# Patient Record
Sex: Male | Born: 1950 | Race: White | Hispanic: No | Marital: Single | Smoking: Never smoker
Health system: Southern US, Community
[De-identification: ages and names within clinical notes are randomized; demographics above are authoritative.]

## PROBLEM LIST (undated history)

## (undated) DIAGNOSIS — F329 Major depressive disorder, single episode, unspecified: Secondary | ICD-10-CM

## (undated) DIAGNOSIS — T7840XA Allergy, unspecified, initial encounter: Secondary | ICD-10-CM

## (undated) DIAGNOSIS — F32A Depression, unspecified: Secondary | ICD-10-CM

## (undated) DIAGNOSIS — F411 Generalized anxiety disorder: Secondary | ICD-10-CM

## (undated) DIAGNOSIS — J189 Pneumonia, unspecified organism: Secondary | ICD-10-CM

## (undated) DIAGNOSIS — I872 Venous insufficiency (chronic) (peripheral): Secondary | ICD-10-CM

## (undated) DIAGNOSIS — R7303 Prediabetes: Secondary | ICD-10-CM

## (undated) DIAGNOSIS — M1990 Unspecified osteoarthritis, unspecified site: Secondary | ICD-10-CM

## (undated) DIAGNOSIS — E119 Type 2 diabetes mellitus without complications: Secondary | ICD-10-CM

## (undated) DIAGNOSIS — M545 Low back pain, unspecified: Secondary | ICD-10-CM

## (undated) DIAGNOSIS — G709 Myoneural disorder, unspecified: Secondary | ICD-10-CM

## (undated) DIAGNOSIS — I10 Essential (primary) hypertension: Secondary | ICD-10-CM

## (undated) DIAGNOSIS — J45909 Unspecified asthma, uncomplicated: Secondary | ICD-10-CM

## (undated) DIAGNOSIS — F419 Anxiety disorder, unspecified: Secondary | ICD-10-CM

## (undated) DIAGNOSIS — Z860100 Personal history of colon polyps, unspecified: Secondary | ICD-10-CM

## (undated) DIAGNOSIS — K219 Gastro-esophageal reflux disease without esophagitis: Secondary | ICD-10-CM

## (undated) DIAGNOSIS — K8020 Calculus of gallbladder without cholecystitis without obstruction: Secondary | ICD-10-CM

## (undated) DIAGNOSIS — E785 Hyperlipidemia, unspecified: Secondary | ICD-10-CM

## (undated) DIAGNOSIS — E669 Obesity, unspecified: Secondary | ICD-10-CM

## (undated) DIAGNOSIS — K5730 Diverticulosis of large intestine without perforation or abscess without bleeding: Secondary | ICD-10-CM

## (undated) HISTORY — DX: Allergy, unspecified, initial encounter: T78.40XA

## (undated) HISTORY — DX: Obesity, unspecified: E66.9

## (undated) HISTORY — DX: Low back pain: M54.5

## (undated) HISTORY — DX: Venous insufficiency (chronic) (peripheral): I87.2

## (undated) HISTORY — PX: COLONOSCOPY: SHX174

## (undated) HISTORY — DX: Personal history of colonic polyps: Z86.010

## (undated) HISTORY — DX: Essential (primary) hypertension: I10

## (undated) HISTORY — DX: Anxiety disorder, unspecified: F41.9

## (undated) HISTORY — PX: POLYPECTOMY: SHX149

## (undated) HISTORY — DX: Major depressive disorder, single episode, unspecified: F32.9

## (undated) HISTORY — PX: LUMBAR LAMINECTOMY: SHX95

## (undated) HISTORY — DX: Prediabetes: R73.03

## (undated) HISTORY — DX: Unspecified asthma, uncomplicated: J45.909

## (undated) HISTORY — DX: Generalized anxiety disorder: F41.1

## (undated) HISTORY — DX: Personal history of colon polyps, unspecified: Z86.0100

## (undated) HISTORY — DX: Unspecified osteoarthritis, unspecified site: M19.90

## (undated) HISTORY — DX: Diverticulosis of large intestine without perforation or abscess without bleeding: K57.30

## (undated) HISTORY — DX: Depression, unspecified: F32.A

## (undated) HISTORY — DX: Gastro-esophageal reflux disease without esophagitis: K21.9

## (undated) HISTORY — PX: LUMBAR FUSION: SHX111

## (undated) HISTORY — DX: Calculus of gallbladder without cholecystitis without obstruction: K80.20

## (undated) HISTORY — DX: Hyperlipidemia, unspecified: E78.5

## (undated) HISTORY — DX: Low back pain, unspecified: M54.50

---

## 2004-04-15 ENCOUNTER — Encounter: Admission: RE | Admit: 2004-04-15 | Discharge: 2004-04-15 | Payer: Self-pay | Admitting: Orthopaedic Surgery

## 2004-07-17 ENCOUNTER — Ambulatory Visit: Payer: Self-pay | Admitting: Pulmonary Disease

## 2004-08-12 ENCOUNTER — Ambulatory Visit: Payer: Self-pay | Admitting: Gastroenterology

## 2004-08-20 ENCOUNTER — Ambulatory Visit: Payer: Self-pay | Admitting: Gastroenterology

## 2004-09-07 ENCOUNTER — Emergency Department (HOSPITAL_COMMUNITY): Admission: EM | Admit: 2004-09-07 | Discharge: 2004-09-08 | Payer: Self-pay | Admitting: Emergency Medicine

## 2004-09-15 ENCOUNTER — Ambulatory Visit: Payer: Self-pay | Admitting: Pulmonary Disease

## 2004-11-17 ENCOUNTER — Ambulatory Visit: Payer: Self-pay | Admitting: Pulmonary Disease

## 2005-02-16 ENCOUNTER — Ambulatory Visit: Payer: Self-pay | Admitting: Pulmonary Disease

## 2005-05-12 ENCOUNTER — Ambulatory Visit: Payer: Self-pay | Admitting: Internal Medicine

## 2005-05-13 ENCOUNTER — Ambulatory Visit: Payer: Self-pay | Admitting: Pulmonary Disease

## 2005-05-14 ENCOUNTER — Ambulatory Visit: Payer: Self-pay | Admitting: Pulmonary Disease

## 2005-06-12 ENCOUNTER — Ambulatory Visit: Payer: Self-pay | Admitting: Internal Medicine

## 2005-08-12 ENCOUNTER — Ambulatory Visit: Payer: Self-pay | Admitting: Pulmonary Disease

## 2006-01-19 ENCOUNTER — Encounter: Admission: RE | Admit: 2006-01-19 | Discharge: 2006-01-19 | Payer: Self-pay | Admitting: Orthopaedic Surgery

## 2006-01-27 ENCOUNTER — Ambulatory Visit: Payer: Self-pay | Admitting: Pulmonary Disease

## 2006-01-28 ENCOUNTER — Ambulatory Visit: Payer: Self-pay | Admitting: Cardiology

## 2006-06-15 ENCOUNTER — Ambulatory Visit: Payer: Self-pay | Admitting: Pulmonary Disease

## 2006-07-01 ENCOUNTER — Ambulatory Visit: Payer: Self-pay | Admitting: Pulmonary Disease

## 2006-07-16 ENCOUNTER — Ambulatory Visit: Payer: Self-pay | Admitting: Pulmonary Disease

## 2007-01-04 ENCOUNTER — Ambulatory Visit: Payer: Self-pay | Admitting: Pulmonary Disease

## 2007-06-06 ENCOUNTER — Ambulatory Visit: Payer: Self-pay | Admitting: Pulmonary Disease

## 2007-06-06 LAB — CONVERTED CEMR LAB
ALT: 27 units/L (ref 0–53)
AST: 19 units/L (ref 0–37)
Albumin: 3.9 g/dL (ref 3.5–5.2)
BUN: 10 mg/dL (ref 6–23)
Basophils Absolute: 0 10*3/uL (ref 0.0–0.1)
Basophils Relative: 0.4 % (ref 0.0–1.0)
Bilirubin, Direct: 0.1 mg/dL (ref 0.0–0.3)
Calcium: 9.4 mg/dL (ref 8.4–10.5)
Chloride: 108 meq/L (ref 96–112)
Cholesterol: 153 mg/dL (ref 0–200)
Creatinine, Ser: 0.9 mg/dL (ref 0.4–1.5)
Direct LDL: 92.5 mg/dL
Eosinophils Relative: 1.5 % (ref 0.0–5.0)
Glucose, Bld: 107 mg/dL — ABNORMAL HIGH (ref 70–99)
HCT: 44.9 % (ref 39.0–52.0)
Lymphocytes Relative: 25.1 % (ref 12.0–46.0)
MCV: 88.9 fL (ref 78.0–100.0)
Monocytes Relative: 7.9 % (ref 3.0–11.0)
Neutro Abs: 4.6 10*3/uL (ref 1.4–7.7)
Neutrophils Relative %: 65.1 % (ref 43.0–77.0)
Platelets: 243 10*3/uL (ref 150–400)
RBC: 5.06 M/uL (ref 4.22–5.81)
RDW: 11.7 % (ref 11.5–14.6)
Sodium: 143 meq/L (ref 135–145)
Total Bilirubin: 0.8 mg/dL (ref 0.3–1.2)
Total CHOL/HDL Ratio: 5.4
Triglycerides: 230 mg/dL (ref 0–149)
VLDL: 46 mg/dL — ABNORMAL HIGH (ref 0–40)
WBC: 6.9 10*3/uL (ref 4.5–10.5)

## 2007-06-23 ENCOUNTER — Ambulatory Visit: Payer: Self-pay | Admitting: Pulmonary Disease

## 2007-06-23 DIAGNOSIS — J309 Allergic rhinitis, unspecified: Secondary | ICD-10-CM | POA: Insufficient documentation

## 2007-06-23 DIAGNOSIS — R7303 Prediabetes: Secondary | ICD-10-CM

## 2007-06-23 DIAGNOSIS — K649 Unspecified hemorrhoids: Secondary | ICD-10-CM | POA: Insufficient documentation

## 2007-06-23 DIAGNOSIS — E669 Obesity, unspecified: Secondary | ICD-10-CM | POA: Insufficient documentation

## 2007-06-23 DIAGNOSIS — F329 Major depressive disorder, single episode, unspecified: Secondary | ICD-10-CM | POA: Insufficient documentation

## 2007-06-23 DIAGNOSIS — M545 Low back pain: Secondary | ICD-10-CM | POA: Insufficient documentation

## 2007-06-23 DIAGNOSIS — K219 Gastro-esophageal reflux disease without esophagitis: Secondary | ICD-10-CM | POA: Insufficient documentation

## 2007-06-23 DIAGNOSIS — F411 Generalized anxiety disorder: Secondary | ICD-10-CM | POA: Insufficient documentation

## 2007-06-23 DIAGNOSIS — I10 Essential (primary) hypertension: Secondary | ICD-10-CM | POA: Insufficient documentation

## 2007-06-23 DIAGNOSIS — E119 Type 2 diabetes mellitus without complications: Secondary | ICD-10-CM | POA: Insufficient documentation

## 2007-09-05 ENCOUNTER — Telehealth: Payer: Self-pay | Admitting: Pulmonary Disease

## 2007-09-12 ENCOUNTER — Telehealth (INDEPENDENT_AMBULATORY_CARE_PROVIDER_SITE_OTHER): Payer: Self-pay | Admitting: *Deleted

## 2007-09-26 ENCOUNTER — Telehealth (INDEPENDENT_AMBULATORY_CARE_PROVIDER_SITE_OTHER): Payer: Self-pay | Admitting: *Deleted

## 2008-01-04 ENCOUNTER — Ambulatory Visit: Payer: Self-pay | Admitting: Pulmonary Disease

## 2008-01-04 DIAGNOSIS — D126 Benign neoplasm of colon, unspecified: Secondary | ICD-10-CM

## 2008-01-04 DIAGNOSIS — K5730 Diverticulosis of large intestine without perforation or abscess without bleeding: Secondary | ICD-10-CM | POA: Insufficient documentation

## 2008-01-04 DIAGNOSIS — J45909 Unspecified asthma, uncomplicated: Secondary | ICD-10-CM

## 2008-01-04 DIAGNOSIS — I872 Venous insufficiency (chronic) (peripheral): Secondary | ICD-10-CM | POA: Insufficient documentation

## 2008-01-04 DIAGNOSIS — E785 Hyperlipidemia, unspecified: Secondary | ICD-10-CM | POA: Insufficient documentation

## 2008-03-28 ENCOUNTER — Telehealth: Payer: Self-pay | Admitting: Gastroenterology

## 2008-04-01 ENCOUNTER — Emergency Department (HOSPITAL_COMMUNITY): Admission: EM | Admit: 2008-04-01 | Discharge: 2008-04-01 | Payer: Self-pay | Admitting: Emergency Medicine

## 2008-06-06 ENCOUNTER — Ambulatory Visit: Payer: Self-pay | Admitting: Pulmonary Disease

## 2008-06-19 ENCOUNTER — Ambulatory Visit: Payer: Self-pay | Admitting: Pulmonary Disease

## 2008-07-04 ENCOUNTER — Telehealth (INDEPENDENT_AMBULATORY_CARE_PROVIDER_SITE_OTHER): Payer: Self-pay | Admitting: *Deleted

## 2008-07-04 LAB — CONVERTED CEMR LAB
ALT: 38 units/L (ref 0–53)
AST: 19 units/L (ref 0–37)
Albumin: 4.2 g/dL (ref 3.5–5.2)
Alkaline Phosphatase: 71 units/L (ref 39–117)
BUN: 17 mg/dL (ref 6–23)
Basophils Relative: 0 % (ref 0.0–3.0)
Chloride: 102 meq/L (ref 96–112)
Creatinine, Ser: 1.2 mg/dL (ref 0.4–1.5)
Eosinophils Absolute: 0.2 10*3/uL (ref 0.0–0.7)
Eosinophils Relative: 2.5 % (ref 0.0–5.0)
Glucose, Bld: 81 mg/dL (ref 70–99)
HCT: 50.5 % (ref 39.0–52.0)
HDL: 30.7 mg/dL — ABNORMAL LOW (ref >39.0)
Hgb A1c MFr Bld: 5.1 % (ref 4.6–6.0)
MCV: 92.2 fL (ref 78.0–100.0)
Monocytes Relative: 8.4 % (ref 3.0–12.0)
Neutrophils Relative %: 58.2 % (ref 43.0–77.0)
Platelets: 229 10*3/uL (ref 150–400)
Potassium: 3.7 meq/L (ref 3.5–5.1)
RBC: 5.48 M/uL (ref 4.22–5.81)
Total CHOL/HDL Ratio: 4.3
Total Protein: 6.9 g/dL (ref 6.0–8.3)
Triglycerides: 208 mg/dL (ref 0–149)
WBC: 6.9 10*3/uL (ref 4.5–10.5)

## 2008-07-10 ENCOUNTER — Telehealth (INDEPENDENT_AMBULATORY_CARE_PROVIDER_SITE_OTHER): Payer: Self-pay | Admitting: *Deleted

## 2008-10-02 ENCOUNTER — Telehealth: Payer: Self-pay | Admitting: Pulmonary Disease

## 2008-10-12 ENCOUNTER — Telehealth (INDEPENDENT_AMBULATORY_CARE_PROVIDER_SITE_OTHER): Payer: Self-pay | Admitting: *Deleted

## 2008-10-17 ENCOUNTER — Telehealth: Payer: Self-pay | Admitting: Pulmonary Disease

## 2009-01-14 ENCOUNTER — Telehealth (INDEPENDENT_AMBULATORY_CARE_PROVIDER_SITE_OTHER): Payer: Self-pay | Admitting: *Deleted

## 2009-01-23 ENCOUNTER — Ambulatory Visit: Payer: Self-pay | Admitting: Pulmonary Disease

## 2009-01-24 ENCOUNTER — Encounter: Payer: Self-pay | Admitting: Pulmonary Disease

## 2009-04-18 ENCOUNTER — Telehealth: Payer: Self-pay | Admitting: Pulmonary Disease

## 2009-04-30 ENCOUNTER — Encounter: Payer: Self-pay | Admitting: Pulmonary Disease

## 2009-05-14 ENCOUNTER — Telehealth: Payer: Self-pay | Admitting: Pulmonary Disease

## 2009-05-29 ENCOUNTER — Ambulatory Visit: Payer: Self-pay | Admitting: Pulmonary Disease

## 2009-07-04 ENCOUNTER — Encounter (INDEPENDENT_AMBULATORY_CARE_PROVIDER_SITE_OTHER): Payer: Self-pay | Admitting: *Deleted

## 2009-07-15 ENCOUNTER — Ambulatory Visit: Payer: Self-pay | Admitting: Pulmonary Disease

## 2009-07-16 ENCOUNTER — Ambulatory Visit: Payer: Self-pay | Admitting: Pulmonary Disease

## 2009-07-17 ENCOUNTER — Telehealth: Payer: Self-pay | Admitting: Pulmonary Disease

## 2009-07-21 LAB — CONVERTED CEMR LAB
ALT: 33 units/L (ref 0–53)
AST: 21 units/L (ref 0–37)
Albumin: 4 g/dL (ref 3.5–5.2)
Alkaline Phosphatase: 73 units/L (ref 39–117)
Basophils Absolute: 0 10*3/uL (ref 0.0–0.1)
Calcium: 8.9 mg/dL (ref 8.4–10.5)
Eosinophils Relative: 2.1 % (ref 0.0–5.0)
GFR calc non Af Amer: 81.64 mL/min (ref >60)
Glucose, Bld: 124 mg/dL — ABNORMAL HIGH (ref 70–99)
HCT: 50.2 % (ref 39.0–52.0)
HDL: 29.3 mg/dL — ABNORMAL LOW (ref >39.00)
Hemoglobin: 17.5 g/dL — ABNORMAL HIGH (ref 13.0–17.0)
LDL Cholesterol: 65 mg/dL (ref 0–99)
Lymphocytes Relative: 23.3 % (ref 12.0–46.0)
Monocytes Relative: 7.2 % (ref 3.0–12.0)
Neutro Abs: 4.5 10*3/uL (ref 1.4–7.7)
Potassium: 4 meq/L (ref 3.5–5.1)
RBC: 5.32 M/uL (ref 4.22–5.81)
RDW: 11.4 % — ABNORMAL LOW (ref 11.5–14.6)
Sodium: 143 meq/L (ref 135–145)
Total Bilirubin: 0.7 mg/dL (ref 0.3–1.2)
Total CHOL/HDL Ratio: 4
VLDL: 36.8 mg/dL (ref 0.0–40.0)
WBC: 6.7 10*3/uL (ref 4.5–10.5)

## 2009-07-29 ENCOUNTER — Telehealth: Payer: Self-pay | Admitting: Pulmonary Disease

## 2009-07-30 ENCOUNTER — Encounter: Payer: Self-pay | Admitting: Pulmonary Disease

## 2009-10-16 ENCOUNTER — Ambulatory Visit: Payer: Self-pay | Admitting: Pulmonary Disease

## 2009-11-06 ENCOUNTER — Telehealth (INDEPENDENT_AMBULATORY_CARE_PROVIDER_SITE_OTHER): Payer: Self-pay | Admitting: *Deleted

## 2009-11-13 ENCOUNTER — Ambulatory Visit: Payer: Self-pay | Admitting: Pulmonary Disease

## 2009-11-13 LAB — CONVERTED CEMR LAB
BUN: 14 mg/dL (ref 6–23)
Chloride: 108 meq/L (ref 96–112)
Creatinine, Ser: 1 mg/dL (ref 0.4–1.5)
Glucose, Bld: 107 mg/dL — ABNORMAL HIGH (ref 70–99)
Potassium: 4.4 meq/L (ref 3.5–5.1)

## 2010-01-13 ENCOUNTER — Ambulatory Visit: Payer: Self-pay | Admitting: Pulmonary Disease

## 2010-01-13 ENCOUNTER — Encounter: Payer: Self-pay | Admitting: Pulmonary Disease

## 2010-01-14 ENCOUNTER — Ambulatory Visit: Payer: Self-pay | Admitting: Pulmonary Disease

## 2010-01-14 DIAGNOSIS — D649 Anemia, unspecified: Secondary | ICD-10-CM

## 2010-01-16 LAB — CONVERTED CEMR LAB
AST: 20 units/L (ref 0–37)
Alkaline Phosphatase: 65 units/L (ref 39–117)
Basophils Absolute: 0 10*3/uL (ref 0.0–0.1)
Bilirubin, Direct: 0.1 mg/dL (ref 0.0–0.3)
Eosinophils Relative: 2.8 % (ref 0.0–5.0)
GFR calc non Af Amer: 86.47 mL/min (ref >60)
HCT: 47.9 % (ref 39.0–52.0)
LDL Cholesterol: 47 mg/dL (ref 0–99)
Lymphs Abs: 1.7 10*3/uL (ref 0.7–4.0)
MCHC: 35.3 g/dL (ref 30.0–36.0)
MCV: 90.6 fL (ref 78.0–100.0)
Monocytes Absolute: 0.5 10*3/uL (ref 0.1–1.0)
Platelets: 239 10*3/uL (ref 150.0–400.0)
Potassium: 4.4 meq/L (ref 3.5–5.1)
RDW: 12.7 % (ref 11.5–14.6)
Sodium: 140 meq/L (ref 135–145)
TSH: 0.8 microintl units/mL (ref 0.35–5.50)
Total Bilirubin: 0.5 mg/dL (ref 0.3–1.2)
VLDL: 34.8 mg/dL (ref 0.0–40.0)

## 2010-01-23 ENCOUNTER — Encounter (INDEPENDENT_AMBULATORY_CARE_PROVIDER_SITE_OTHER): Payer: Self-pay | Admitting: *Deleted

## 2010-03-07 ENCOUNTER — Encounter (INDEPENDENT_AMBULATORY_CARE_PROVIDER_SITE_OTHER): Payer: Self-pay | Admitting: *Deleted

## 2010-03-11 ENCOUNTER — Ambulatory Visit: Payer: Self-pay | Admitting: Gastroenterology

## 2010-03-25 ENCOUNTER — Ambulatory Visit: Payer: Self-pay | Admitting: Gastroenterology

## 2010-03-27 ENCOUNTER — Encounter: Payer: Self-pay | Admitting: Gastroenterology

## 2010-06-02 ENCOUNTER — Ambulatory Visit: Payer: Self-pay | Admitting: Pulmonary Disease

## 2010-06-10 ENCOUNTER — Telehealth (INDEPENDENT_AMBULATORY_CARE_PROVIDER_SITE_OTHER): Payer: Self-pay | Admitting: *Deleted

## 2010-07-15 ENCOUNTER — Ambulatory Visit: Payer: Self-pay | Admitting: Pulmonary Disease

## 2010-09-22 ENCOUNTER — Ambulatory Visit
Admission: RE | Admit: 2010-09-22 | Discharge: 2010-09-22 | Payer: Self-pay | Source: Home / Self Care | Attending: Pulmonary Disease | Admitting: Pulmonary Disease

## 2010-09-22 DIAGNOSIS — J069 Acute upper respiratory infection, unspecified: Secondary | ICD-10-CM | POA: Insufficient documentation

## 2010-10-02 NOTE — Assessment & Plan Note (Signed)
Summary: flu shot/jd   Nurse Visit   Allergies: No Known Drug Allergies  Immunizations Administered:  Influenza Vaccine # 1:    Vaccine Type: Fluvax 3+    Site: left deltoid    Mfr: GlaxoSmithKline    Dose: 0.5 ml    Route: IM    Given by: Kandice Hams CMA    Exp. Date: 02/28/2011    Lot #: [id number]    VIS given: 03/25/10 version given June 02, 2010.  Flu Vaccine Consent Questions:    Do you have a history of severe allergic reactions to this vaccine? no    Any prior history of allergic reactions to egg and/or gelatin? no    Do you have a sensitivity to the preservative Thimersol? no    Do you have a past history of Guillan-Barre Syndrome? no    Do you currently have an acute febrile illness? no    Have you ever had a severe reaction to latex? no    Vaccine information given and explained to patient? yes  Orders Added: 1)  Flu Vaccine 16yrs + [90658] 2)  Admin 1st Vaccine [29562]

## 2010-10-02 NOTE — Miscellaneous (Signed)
Summary: tramadol and flexeril refills  Medications Added FLEXERIL 10 MG  TABS (CYCLOBENZAPRINE HCL) take one tablet at bedtime as needed... TRAMADOL HCL 50 MG TABS (TRAMADOL HCL) 1 tabs by mouth three times a day as needed pain TRAMADOL HCL 50 MG TABS (TRAMADOL HCL) 1-2 tabs by mouth every 4-6 hours as needed pain       Clinical Lists Changes  Medications: Added new medication of TRAMADOL HCL 50 MG TABS (TRAMADOL HCL) 1-2 tabs by mouth every 4-6 hours as needed pain - Signed Changed medication from TRAMADOL HCL 50 MG TABS (TRAMADOL HCL) 1-2 tabs by mouth every 4-6 hours as needed pain to TRAMADOL HCL 50 MG TABS (TRAMADOL HCL) 1 tabs by mouth three times a day as needed pain - Signed Changed medication from FLEXERIL 10 MG  TABS (CYCLOBENZAPRINE HCL) take one tablet at bedtime as needed... to FLEXERIL 10 MG  TABS (CYCLOBENZAPRINE HCL) take one tablet at bedtime as needed... - Signed Rx of TRAMADOL HCL 50 MG TABS (TRAMADOL HCL) 1 tabs by mouth three times a day as needed pain;  #270 x 0;  Signed;  Entered by: Boone Master CNA;  Authorized by: Michele Mcalpine MD;  Method used: Electronically to [id number]*, [address] [place], [place], [place]  [address], Ph: [phone], Fax: [phone] Rx of FLEXERIL 10 MG  TABS (CYCLOBENZAPRINE HCL) take one tablet at bedtime as needed...;  #90 x 0;  Signed;  Entered by: Boone Master CNA;  Authorized by: Michele Mcalpine MD;  Method used: Electronically to [facility] [address]*, [address] [place], [place], [place]  [address], Ph: [phone], Fax: [phone]    Prescriptions: FLEXERIL 10 MG  TABS (CYCLOBENZAPRINE HCL) take one tablet at bedtime as needed...  #90 x 0   Entered by:   Boone Master CNA   Authorized by:   Michele Mcalpine MD   Signed by:   Boone Master CNA on 10/16/2009   Method used:   Electronically to        [facility] [phone]* (retail)       [address] [place], [place]  [address]       Ph: [phone]       Fax: [phone]   RxID:   [id number] TRAMADOL HCL 50 MG TABS (TRAMADOL HCL) 1 tabs by mouth three times a day as needed pain  #270 x 0   Entered by:   Boone Master CNA   Authorized by:   Michele Mcalpine MD   Signed by:   Boone Master CNA on 10/16/2009   Method used:   Electronically to        [facility] [phone]* (retail)       [address] [place], [place]  [address]       Ph: [phone]       Fax: [phone]   RxID:   [id number]

## 2010-10-02 NOTE — Assessment & Plan Note (Signed)
Summary: NP follow up - HTN   CC:  follow up b/p .  History of Present Illness: 60  y/o WM  with known hx of HTN, chronic pain syndrome, DJD   follow up visit... he has mult med issues as noted below... he has a hx of a chronic pain syndrome and mult somatic complaints... he is on disability due to LBP & chr pain syndrome- followed by DrYates & Otelia Sergeant, DrBeane & [place], DrTruslow... we filled out his disability form in May09 & in May10...   ~  June 19, 2008:  he tells me that he was seen in the ER 2-3 months ago for abd pain- he had a CT Abd which showed fat in liver/ pancreas, divertics, no renal stones etc...  and lab work showing no blood in stool, Hg= 18, Chem= norm, UA= clear... he worries about his BP and complains about his back... he has already had his seasonal flu shot.   ~  Jan 23, 2009:  he is here for his disability papers and he talks nonstop about his disability and former problems w/ work... as best I can tell, when I can get him on point, he has no specific complaints or concerns (except the cost of his generic DCN100)...   ~  July 15, 2009:  mult somatic complaints but it appears as though he's doing reasonably well overall... he notes "superficial veins popping out on my chest when I lookin the mirror" & exam of his chest wall is normal... he has continued LBP issues treated by [facility]... he is concerned about formulary changes for 2011 & he will research this & let us know what he wants to do...  October 16, 2009 --Presents for work in visit. Complains of elevated BP (up to 170/110) with HA, both ears clogged, "throbbing" neck, stiffness in back of neck, chest discomfort x5days. Labs reviewed from last visit, w/ nml TSH.  Denies decongestants. Drinks daily caffeine- soda. Denies, palpitations, dyspnea, orthopnea, hemoptysis, fever, n/v/d, edema, headache,recent travel or antibiotics, syncope, exertional chest pain. Feels joint aches all over. B/P up and  down 140-170.    November 13, 2009--Presents for B/P follow up. 4 week follow up for HTN    Last visit b/p up  ~170/110. Started on Diovan 160mg . He is tolerating well. B/P avg 110-130. States the diovan is too expensive and would like something less expesive for which he has brought a list of alternatives. Cozaar is on list. Denies chest pain, dyspnea, orthopnea, hemoptysis, fever, n/v/d, edema, headache.   Medications Prior to Update: 1)  Amlodipine Besylate 5 Mg Tabs (Amlodipine Besylate) .Marland Kitchen.. 1 By Mouth Daily 2)  Omeprazole 20 Mg  Cpdr (Omeprazole) .... Take One Tab By Mouth Once Daily.Marland KitchenMarland Kitchen 3)  Flexeril 10 Mg  Tabs (Cyclobenzaprine Hcl) .... Take One Tablet At Bedtime As Needed... 4)  Alprazolam 0.5 Mg  Tabs (Alprazolam) .... Take One Tablet Three Times A Day As Needed For Nerves.Marland KitchenMarland Kitchen 5)  Aspirin 81 Mg Tbec (Aspirin) .... Take 1 Tablet By Mouth Once A Day 6)  Tramadol Hcl 50 Mg Tabs (Tramadol Hcl) .Marland Kitchen.. 1 Tabs By Mouth Three Times A Day As Needed Pain  Current Medications (verified): 1)  Amlodipine Besylate 5 Mg Tabs (Amlodipine Besylate) .Marland Kitchen.. 1 By Mouth Daily 2)  Omeprazole 20 Mg  Cpdr (Omeprazole) .... Take One Tab By Mouth Once Daily.Marland KitchenMarland Kitchen 3)  Flexeril 10 Mg  Tabs (Cyclobenzaprine Hcl) .... Take One Tablet At Bedtime As Needed... 4)  Alprazolam  0.5 Mg  Tabs (Alprazolam) .... Take One Tablet Three Times A Day As Needed For Nerves.Marland KitchenMarland Kitchen 5)  Aspirin 81 Mg Tbec (Aspirin) .... Take 1 Tablet By Mouth Once A Day 6)  Tramadol Hcl 50 Mg Tabs (Tramadol Hcl) .Marland Kitchen.. 1 Tabs By Mouth Three Times A Day As Needed Pain  Allergies (verified): No Known Drug Allergies  Past History:  Past Medical History: Last updated: 10/16/2009 ALLERGIC RHINITIS (ICD-477.9) - hx mild AR and sinusitis in past... prev eval from DrESL.  ASTHMA, MILD (ICD-493.90) - hx mild AB in the past... prev on Advair & Singulair... no recent URI's etc.  HYPERTENSION (ICD-401.9) - controlled on diet, low sodium, and AMLODIPINE 5mg /d.   VENOUS  INSUFFICIENCY (ICD-459.81) - only tr edema as noted... REC ~ low sodium/ elevate/ TED's...  HYPERLIPIDEMIA (ICD-272.4) - on diet alone...  ~  FLP 10/08 showed TChol 153, TG 230, HDL 29, LDL 93... needs better diet, consider fibrate Rx.  ~  FLP 10/09 (wt= 250#) showed TChol 133, TG 208, HDL 31, LDL 65... may need fibrate Rx.  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   ~  prev labs w/ BS as high as 136... no problem since then...  ~  labs in 2007 showed BS ~ 100, HgA1c=5.1.Marland KitchenMarland Kitchen diet Rx alone, pt encouraged to lose weight...  ~  labs 10/08 showed BS=107  ~  labs 10/09 showed BS= 81, A1c= 5.1  ~  labs 11/10 showed BS= 124  OBESITY (ICD-278.00) - not really on diet or exercise program... states he's walking some and mowing the yard slowly...   ~  weight 5/09 = 269#  ~  weight 10/09 = 250#  ~  weight 5/10 = 262#  ~  weight 11/10 = 273#... we discussed diet + exercise!  GERD (ICD-530.81) - on OMEPRAZOLE 20mg /d... it controls his symptoms well...  DIVERTICULOSIS OF COLON (ICD-562.10), COLONIC POLYPS (ICD-211.3), HEMORRHOIDS (ICD-455.6) -   ~  last colonoscopy 12/05 by DrStark showed divertics & several polyps= ?path... f/u planned 72yrs.  BACK PAIN, LUMBAR (ICD-724.2) - he takes FLEXERIL 10mg Qhs & DCN100 Tid for pain... he notes that he has new insurance and [facility] & Dor[facility] Bull[facility] are not on the list... he is going to see DrTooke for his opinion...  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 DEPRESSION (I  Past Surgical History: Last updated: 07/15/2009 S/P lumbar laminectomy in 1989 & 1994  Family History: Last updated: Feb 22, 2009 Father died age 57 from DM, ASHD, stroke...  he also had a hx of colon cancer & prostate cancer. Mother died age 83 from metastatic lung cancer No siblings  Social History: Last updated: 02-22-09 Single, never married No  children Never smoked  No alcohol prev employed doing Naval architect work for [employer] he's been on disability since 3/05 due to his back...  Risk Factors: Smoking Status: never (2009/02/22)  Review of Systems      See HPI  Vital Signs:  Patient profile:   60 year old male Height:      75 inches Weight:      260.50 pounds BMI:     32.68 O2 Sat:      98 % on Room air Temp:     98.2 degrees F oral Pulse rate:   73 / minute BP sitting:   116 / 74  (right arm) Cuff size:   regular  Vitals Entered By: Boone Master CNA (November 13, 2009 9:59 AM)  O2 Flow:  Room air CC: follow up b/p  Is Patient Diabetic? No Comments Medications reviewed with patient Daytime contact number verified with patient. Boone Master CNA  November 13, 2009 10:00 AM    Physical Exam  Additional Exam:  WD, Overweight, 60 y/o WM in NAD... GENERAL:  Alert & oriented HEENT:  [place]/AT, EOM-wnl, PERRLA, EACs-clear, TMs-wnl, NOSE-clear, THROAT-clear & wnl. NECK:  Supple w/ fairROM; no JVD; normal carotid impulses w/o bruits; no thyromegaly or nodules palpated; no lymphadenopathy. CHEST:  Clear to P & A; without wheezes/ rales/ or rhonchi; he has mult trigger points... HEART:  Regular Rhythm;  gr1/6 SEM, no rubs or gallops heard... ABDOMEN:  Soft & nontender; normal bowel sounds; no organomegaly or masses detected. EXT: without deformities, mild arthritic changes; no varicose veins/ +venous insuffic/ tr edema. NEURO:  CN's intact;  no focal neuro deficits... DERM:  No lesions noted; no rash etc...      Impression & Recommendations:  Problem # 1:  HYPERTENSION (ICD-401.9)  Much improved control. Will change Diovan to Cozaar 50mg  d/t cost check bmet today.  REC:  Will change Diovan to Losartan 50mg  once daily  Bring blood pressure log to each next office visit.  Low salt diet.  follow up Dr. Kriste Basque in 2 months  Please contact office for sooner follow up if symptoms do not improve or worsen  His  updated medication list for this problem includes:    Amlodipine Besylate 5 Mg Tabs (Amlodipine besylate) .Marland Kitchen... 1 by mouth daily    Losartan Potassium 50 Mg Tabs (Losartan potassium) .Marland Kitchen... 1 by mouth once daily  Orders: TLB-BMP (Basic Metabolic Panel-BMET) (80048-METABOL) Est. Patient Level III ([address])  Medications Added to Medication List This Visit: 1)  Losartan Potassium 50 Mg Tabs (Losartan potassium) .Marland Kitchen.. 1 by mouth once daily  Complete Medication List: 1)  Amlodipine Besylate 5 Mg Tabs (Amlodipine besylate) .Marland Kitchen.. 1 by mouth daily 2)  Losartan Potassium 50 Mg Tabs (Losartan potassium) .Marland Kitchen.. 1 by mouth once daily 3)  Omeprazole 20 Mg Cpdr (Omeprazole) .... Take one tab by mouth once daily.Marland KitchenMarland Kitchen 4)  Flexeril 10 Mg Tabs (Cyclobenzaprine hcl) .... Take one tablet at bedtime as needed... 5)  Alprazolam 0.5 Mg Tabs (Alprazolam) .... Take one tablet three times a day as needed for nerves... 6)  Aspirin 81 Mg Tbec (Aspirin) .... Take 1 tablet by mouth once a day 7)  Tramadol Hcl 50 Mg Tabs (Tramadol hcl) .Marland Kitchen.. 1 tabs by mouth three times a day as needed pain  Patient Instructions: 1)  Will change Diovan to Losartan 50mg  once daily  2)  Bring blood pressure log to each next office visit.  3)  Low salt diet.  4)  follow up Dr. Kriste Basque in 2 months  5)  Please contact office for sooner follow up if symptoms do not improve or worsen  Prescriptions: LOSARTAN POTASSIUM 50 MG TABS (LOSARTAN POTASSIUM) 1 by mouth once daily  #90 x 3   Entered and Authorized by:   Rubye Oaks NP   Signed by:   Zebedee Segundo NP on 11/13/2009   Method used:   Faxed to ...       [facility] (mail-order)             , [place]         Ph: [phone]       Fax: [phone]   RxID:   [id number]

## 2010-10-02 NOTE — Progress Notes (Signed)
Summary: prescription for Xanax  Phone Note Call from Patient Call back at Home Phone [phone]   Caller: Patient Call For: nadel Summary of Call: Pt states his rx for alprazolam 0.5mg  has expired, needs a new one sent.//[facility] Initial call taken by: Darletta Moll,  June 10, 2010 10:32 AM  Follow-up for Phone Call        rx sent to pharmacy.  pt aware. Arman Filter LPN  June 10, 2010 11:34 AM     Prescriptions: ALPRAZOLAM 0.5 MG  TABS (ALPRAZOLAM) take one tablet three times a day as needed for nerves...  #100 x 3   Entered by:   Arman Filter LPN   Authorized by:   Michele Mcalpine MD   Signed by:   Arman Filter LPN on 09/81/1914   Method used:   Telephoned to ...       [facility] [phone]* (retail)       [address] [place], [place]  [address]       Ph: [phone]       Fax: [phone]   RxID:   [id number]

## 2010-10-02 NOTE — Procedures (Signed)
Summary: Colonoscopy  Patient: [name] [name] Note: All result statuses are Final unless otherwise noted.  Tests: (1) Colonoscopy (COL)   COL Colonoscopy           [facility]      [facility]     [address]     [place], [place]  [address]           COLONOSCOPY PROCEDURE REPORT           PATIENT:  [name] [name], [name] [name]  MR#:  [id number]     BIRTHDATE:  [birth year], 59 yrs. old  GENDER:  male     ENDOSCOPIST:  Judie Petit T. Russella Dar, MD, [facility]     PROCEDURE DATE:  03/25/2010     PROCEDURE:  Colonoscopy with biopsy     ASA CLASS:  Class II     INDICATIONS:  1) follow-up of polyp  2) family history of colon     cancer  3) surveillance and high-risk screening, adenomatous     polyp, 07/2004. F and MGM with colon cancer.     MEDICATIONS:   Fentanyl 100 mcg IV, Versed 10 mg IV     DESCRIPTION OF PROCEDURE:   After the risks benefits and     alternatives of the procedure were thoroughly explained, informed     consent was obtained.  Digital rectal exam was performed and     revealed no abnormalities.   The LB [id number] endoscope     was introduced through the anus and advanced to the cecum, which     was identified by both the appendix and ileocecal valve, without     limitations.  The quality of the prep was excellent, using     MoviPrep.  The instrument was then slowly withdrawn as the colon     was fully examined.     <<PROCEDUREIMAGES>>     FINDINGS:  Scattered diverticula were found in the transverse     colon.  Moderate diverticulosis was found in the sigmoid colon.  A     sessile polyp was found in the distal transverse colon. It was 4     mm in size. The polyp was removed using cold biopsy forceps.  This     was otherwise a normal examination of the colon.   Retroflexed     views in the rectum revealed internal hemorrhoids, small.  The     time to cecum =  2.5  minutes. The scope was then withdrawn (time     =  10  min) from the patient and the procedure completed.        COMPLICATIONS:  None           ENDOSCOPIC IMPRESSION:     1) Diverticula, scattered in the transverse colon     2) Moderate diverticulosis in the sigmoid colon     3) 4 mm sessile polyp in the distal transverse colon     4) Internal hemorrhoids           RECOMMENDATIONS:     1) High fiber diet with liberal fluid intake.     2) Await pathology results     3) Repeat Colonoscopy in 5 years pending pathology review           Marquinn Meschke T. Russella Dar, MD, [facility]           CC: Michele Mcalpine, MD           n.     eSIGNED:  Venita Lick. Aniesa Boback at 03/25/2010 11:00 AM           Dallie Piles, [id number]  Note: An exclamation mark (!) indicates a result that was not dispersed into the flowsheet. Document Creation Date: 03/25/2010 11:01 AM _______________________________________________________________________  (1) Order result status: Final Collection or observation date-time: 03/25/2010 10:56 Requested date-time:  Receipt date-time:  Reported date-time:  Referring Physician:   Ordering Physician: Claudette Head [phone]) Specimen Source:  Source: Launa Grill Order Number: [id number] Lab site:   Appended Document: Colonoscopy ```````````````````````````````````````````````````````````````````````````    Procedures Next Due Date:    Colonoscopy: 03/2015

## 2010-10-02 NOTE — Assessment & Plan Note (Signed)
Summary: 6 months/apc   CC:  6 month ROV & review of mult medical problems....  History of Present Illness: 60 y/o WM here for a follow up visit... he has mult med issues as noted below... he has a hx of a chronic pain syndrome and mult somatic complaints... he is on disability due to LBP & chr pain syndrome- followed by DrYates & Otelia Sergeant, DrBeane & Aplington, DrTruslow... we filled out his disability form in May09 & in May10...   ~  Oct09:  he tells me that he was seen in the ER 2-3 months ago for abd pain- he had a CT Abd which showed fat in liver/ pancreas, divertics, no renal stones etc...  and lab work showing no blood in stool, Hg= 18, Chem= norm, UA= clear... he worries about his BP and complains about his back... he has already had his seasonal flu shot.   ~  May10:  he is here for his disability papers and he talks nonstop about his disability and former problems w/ work... as best I can tell, when I can get him on point, he has no specific complaints or concerns (except the cost of his generic DCN100)...  ~  Nov10:  mult somatic complaints but it appears as though he's doing reasonably well overall... he notes "superficial veins popping out on my chest when I look in the mirror" & exam of his chest wall is normal... he has continued LBP issues treated by Rheum, Ortho & Neurosurg... he is concerned about formulary changes for 2011 & he will research this & let us know what he wants to do...   ~  Jan 13, 2010:  he continues to have mult minor somatic complaints, but overall appears stable on current regimen... Losartan was added to his antihypertensive regimen w/ improvement in his home BP checks... he has also lost 20# on diet!!!  He is due for CXR, fasting labs, and f/u colonoscopy (refer to DrStark)... he is quite concerned that his cousin will not drive him here for the colonoscopy & asks that I write a letter indicatng that this is important for him...    Current Problem List:  ALLERGIC  RHINITIS (ICD-477.9) - hx mild AR and sinusitis in past... prev eval from DrESL.  ASTHMA, MILD (ICD-493.90) - hx mild AB in the past... prev on Advair & Singulair... no recent URI's etc.  HYPERTENSION (ICD-401.9) - controlled on diet, low sodium, ASA 81mg , AMLODIPINE 5mg /d, & LOSARTAN 50mg /d... BP= 120/82 today & similar at home now... taking meds regularly & tol well- his ROS is generally pos but no signif CP, palpit, dyspnea, edema.  VENOUS INSUFFICIENCY (ICD-459.81) - only tr edema as noted... REC ~ low sodium/ elevate/ TED's...  HYPERLIPIDEMIA (ICD-272.4) - on diet alone...  ~  FLP 10/08 showed TChol 153, TG 230, HDL 29, LDL 93... needs better diet, consider fibrate Rx.  ~  FLP 10/09 (wt= 250#) showed TChol 133, TG 208, HDL 31, LDL 65... may need fibrate Rx.  ~  FLP 11/10 showed TChol 131, TG 184, HDL 29, LDL 65... rec> beeter low fat diet, get wt down!  ~  FLP 5/11 showed   DIABETES MELLITUS, BORDERLINE (ICD-790.29) - on diet alone w/ weight reduction as noted...  ~  prev labs w/ BS as high as 136... no problem since then...  ~  labs in 2007 showed BS ~ 100, HgA1c=5.1.Marland KitchenMarland Kitchen diet Rx alone, pt encouraged to lose weight...  ~  labs 10/08 showed BS=107  ~  labs 10/09 showed BS= 81, A1c= 5.1  ~  labs 11/10 showed BS= 124  ~  labs 5/11 showed   OBESITY (ICD-278.00) - not really on diet or exercise program... states he's walking some and mowing the yard slowly...   ~  weight 5/09 = 269#  ~  weight 10/09 = 250#  ~  weight 5/10 = 262#  ~  weight 11/10 = 273#... we discussed diet + exercise!  ~  weight 5/11 = 250#... on diet & walking...  GERD (ICD-530.81) - on OMEPRAZOLE 20mg /d... it controls his symptoms well...  DIVERTICULOSIS OF COLON (ICD-562.10), COLONIC POLYPS (ICD-211.3), HEMORRHOIDS (ICD-455.6) - f/u colon due & we will refer to DrStark.  ~  last colonoscopy 12/05 by DrStark showed divertics & several polyps= ?path... f/u planned 83yrs.  BACK PAIN, LUMBAR (ICD-724.2) - he takes  FLEXERIL 10mg Qhs & TRAMADOL 50mg  Tid for pain... he notes that he has new insurance and [facility] & [facility] are not on the list... he is going to see DrTooke for his opinion...  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 DEPRESSION (ICD-311) - I offered psychiatric referral but he declines...  Health Maintenance:  he had the 2010 flu shot...   Allergies (verified): No Known Drug Allergies  Comments:  Nurse/Medical Assistant: The patient's medications and allergies were reviewed with the patient and were updated in the Medication and Allergy Lists.  Past History:  Past Medical History: ALLERGIC RHINITIS (ICD-477.9) ASTHMA, MILD (ICD-493.90) HYPERTENSION (ICD-401.9) VENOUS INSUFFICIENCY (ICD-459.81) HYPERLIPIDEMIA (ICD-272.4) DIABETES MELLITUS, BORDERLINE (ICD-790.29) OBESITY (ICD-278.00) GERD (ICD-530.81) DIVERTICULOSIS OF COLON (ICD-562.10) COLONIC POLYPS (ICD-211.3) HEMORRHOIDS (ICD-455.6) BACK PAIN, LUMBAR (ICD-724.2) ANXIETY DISORDER, GENERALIZED (ICD-300.02) DEPRESSION (ICD-311)  Past Surgical History: S/P lumbar laminectomy in 1989 & 1994  Family History: Reviewed history from 01/23/2009 and no changes required. Father died age 37 from DM, ASHD, stroke...  he also had a hx of colon cancer & prostate cancer. Mother died age 24 from metastatic lung cancer No siblings  Social History: Reviewed history from 01/23/2009 and no changes required. Single, never married No children Never smoked  No alcohol prev employed doing warehouse work for [employer] he's been on disability since 3/05 due to his back...  Review of Systems      See HPI       The patient complains of dyspnea on exertion, peripheral edema, headaches, abdominal pain, muscle weakness, difficulty walking, and depression.  The patient denies anorexia, fever, weight loss, weight gain, vision loss, decreased hearing, hoarseness, chest pain, syncope,  prolonged cough, hemoptysis, melena, hematochezia, severe indigestion/heartburn, hematuria, incontinence, suspicious skin lesions, transient blindness, unusual weight change, abnormal bleeding, enlarged lymph nodes, and angioedema.    Vital Signs:  Patient profile:   60 year old male Height:      75 inches Weight:      250 pounds BMI:     31.36 O2 Sat:      96 % on Room air Temp:     98.0 degrees F oral Pulse rate:   65 / minute BP sitting:   120 / 82  (right arm) Cuff size:   regular  Vitals Entered By: Randell Loop CMA (Jan 13, 2010 11:00 AM)  O2 Sat at Rest %:  96 O2 Flow:  Room air CC: 6 month ROV & review of mult medical problems... Is Patient Diabetic? Yes Pain Assessment Patient in pain? no      Comments NO CHANGES IN MEDS TODAY   Physical Exam  Additional Exam:  WD, Overweight, 60 y/o WM in NAD... GENERAL:  Alert & oriented; he has a depressed mood & sl flat affect... HEENT:  [place]/AT, EOM-wnl, PERRLA, EACs-clear, TMs-wnl, NOSE-clear, THROAT-clear & wnl. NECK:  Supple w/ fairROM; no JVD; normal carotid impulses w/o bruits; no thyromegaly or nodules palpated; no lymphadenopathy. CHEST:  Clear to P & A; without wheezes/ rales/ or rhonchi; he has mult trigger points... HEART:  Regular Rhythm;  gr1/6 SEM, no rubs or gallops heard... ABDOMEN:  Soft & nontender; normal bowel sounds; no organomegaly or masses detected. EXT: without deformities, mild arthritic changes; no varicose veins/ +venous insuffic/ tr edema. NEURO:  CN's intact;  no focal neuro deficits... DERM:  No lesions noted; no rash etc...    CXR  Procedure date:  01/13/2010  Findings:      CHEST - 2 VIEW Comparison: Chest x-ray of 09/07/2004   Findings: The lungs are clear and slightly hyperaerated. Mediastinal contours are stable.  The heart is within normal limits in size.  There are degenerative changes in the lower thoracic spine.   IMPRESSION: Stable chest x-ray with slight hyperaeration.  No  active lung disease.   Read By:  Juline Patch,  M.D.    MISC. Report  Procedure date:  01/13/2010  Findings:      Pt will ret to our lab for FASTING blood work & we will notify him of these results and any recommended change in medications...  SN   Impression & Recommendations:  Problem # 1:  ASTHMA, MILD (ICD-493.90) He has hx mild AR & asthma>  stable, no regular medications required...  Problem # 2:  HYPERTENSION (ICD-401.9) Controlled on meds>  continue same & home monitoring. His updated medication list for this problem includes:    Amlodipine Besylate 5 Mg Tabs (Amlodipine besylate) .Marland Kitchen... 1 by mouth daily    Losartan Potassium 50 Mg Tabs (Losartan potassium) .Marland Kitchen... 1 by mouth once daily  Orders: T-2 View CXR (71020TC)  Problem # 3:  HYPERLIPIDEMIA (ICD-272.4) He will ret for FLP (he has lost 22# & hopefully improved)...   Problem # 4:  DIABETES MELLITUS, BORDERLINE (ICD-790.29) Stable on diet alone, and nice job w/ weight reduction...  Problem # 5:  COLONIC POLYPS (ICD-211.3) Due for f/u colonoscopy by DrStark... letter written at pt request to stress to his cousin how important it is to take eugene to & from this procedure... Orders: Gastroenterology Referral (GI)  Problem # 6:  BACK PAIN, LUMBAR (ICD-724.2) Stable on meds listed... His updated medication list for this problem includes:    Aspirin 81 Mg Tbec (Aspirin) .Marland Kitchen... Take 1 tablet by mouth once a day    Tramadol Hcl 50 Mg Tabs (Tramadol hcl) .Marland Kitchen... 1 tabs by mouth three times a day as needed pain    Flexeril 10 Mg Tabs (Cyclobenzaprine hcl) .Marland Kitchen... Take one tablet at bedtime as needed...  Problem # 7:  ANXIETY DISORDER, GENERALIZED (ICD-300.02) Stable w/ Prn Alpraz... His updated medication list for this problem includes:    Alprazolam 0.5 Mg Tabs (Alprazolam) .Marland Kitchen... Take one tablet three times a day as needed for nerves...  Problem # 8:  OTHER MEDICAL PROBLEMS AS NOTED>>>  Complete Medication List: 1)   Aspirin 81 Mg Tbec (Aspirin) .... Take 1 tablet by mouth once a day 2)  Amlodipine Besylate 5 Mg Tabs (Amlodipine besylate) .Marland Kitchen.. 1 by mouth daily 3)  Losartan Potassium 50 Mg Tabs (Losartan potassium) .Marland Kitchen.. 1 by mouth once daily 4)  Omeprazole 20 Mg Cpdr (Omeprazole) .Marland KitchenMarland KitchenMarland Kitchen  Take one tab by mouth once daily.Marland KitchenMarland Kitchen 5)  Tramadol Hcl 50 Mg Tabs (Tramadol hcl) .Marland Kitchen.. 1 tabs by mouth three times a day as needed pain 6)  Flexeril 10 Mg Tabs (Cyclobenzaprine hcl) .... Take one tablet at bedtime as needed... 7)  Alprazolam 0.5 Mg Tabs (Alprazolam) .... Take one tablet three times a day as needed for nerves...  Patient Instructions: 1)  Today we updated your med list- see below.... 2)  Continue your current medications the same... 3)  Today we did your f/u CXR, please return to our lab in the AM for your FASTING blood work...  4)  please call the "phone tree" in a few days for your lab results.Marland KitchenMarland Kitchen 5)  Continue your diet & exercise program- good job w/ weight reduction!!! 6)  Please schedule a follow-up appointment in 6 months.

## 2010-10-02 NOTE — Letter (Signed)
Summary: Previsit letter  [facility]  [address] [place], [place] [address]   Phone: [phone]  Fax: [phone]       01/23/2010 MRN: [id number]  [name] [name] [address] [place], [place]  [address]  Dear Mr. Chavers,  Welcome to the Gastroenterology Division at [facility].    You are scheduled to see a nurse for your pre-procedure visit on 03-11-10 at 10:00a.m.  on the 3rd floor at [facility], [address]  We ask that you try to arrive at our office 15 minutes prior to your appointment time to allow for check-in.  Your nurse visit will consist of discussing your medical and surgical history, your immediate family medical history, and your medications.    Please bring a complete list of all your medications or, if you prefer, bring the medication bottles and we will list them.  We will need to be aware of both prescribed and over the counter drugs.  We will need to know exact dosage information as well.  If you are on blood thinners (Coumadin, Plavix, Aggrenox, Ticlid, etc.) please call our office today/prior to your appointment, as we need to consult with your physician about holding your medication.   Please be prepared to read and sign documents such as consent forms, a financial agreement, and acknowledgement forms.  If necessary, and with your consent, a friend or relative is welcome to sit-in on the nurse visit with you.  Please bring your insurance card so that we may make a copy of it.  If your insurance requires a referral to see a specialist, please bring your referral form from your primary care physician.  No co-pay is required for this nurse visit.     If you cannot keep your appointment, please call [phone] to cancel or reschedule prior to your appointment date.  This allows [place] the opportunity to schedule an appointment for another patient in need of care.    Thank you for choosing [facility] for your medical needs.   We appreciate the opportunity to care for you.  Please visit [place] at our website  to learn more about our practice.                     Sincerely.                                                                                                                   The Gastroenterology Division

## 2010-10-02 NOTE — Assessment & Plan Note (Signed)
Summary: Acute NP office visit - asthma   CC:  prod cough with clear sticky mucus, SOB, hoarseness, wheezing x3days, and states had nausea and diarrhea for 2days prior.  History of Present Illness: 60  y/o WM  with known hx of HTN, chronic pain syndrome, DJD   follow up visit... he has mult med issues as noted below... he has a hx of a chronic pain syndrome and mult somatic complaints... he is on disability due to LBP & chr pain syndrome- followed by DrYates & Otelia Sergeant, DrBeane & Aplington, DrTruslow... we filled out his disability form in May09 & in May10...   ~  June 19, 2008:  he tells me that he was seen in the ER 2-3 months ago for abd pain- he had a CT Abd which showed fat in liver/ pancreas, divertics, no renal stones etc...  and lab work showing no blood in stool, Hg= 18, Chem= norm, UA= clear... he worries about his BP and complains about his back... he has already had his seasonal flu shot.   ~  Jan 23, 2009:  he is here for his disability papers and he talks nonstop about his disability and former problems w/ work... as best I can tell, when I can get him on point, he has no specific complaints or concerns (except the cost of his generic DCN100)...   ~  July 15, 2009:  mult somatic complaints but it appears as though he's doing reasonably well overall... he notes "superficial veins popping out on my chest when I lookin the mirror" & exam of his chest wall is normal... he has continued LBP issues treated by Rheum, Ortho & Neurosurg... he is concerned about formulary changes for 2011 & he will research this & let us know what he wants to do...  October 16, 2009 --Presents for work in visit. Complains of elevated BP (up to 170/110) with HA, both ears clogged, "throbbing" neck, stiffness in back of neck, chest discomfort x5days. Labs reviewed from last visit, w/ nml TSH.  Denies decongestants. Drinks daily caffeine- soda. Denies, palpitations, dyspnea, orthopnea, hemoptysis, fever, n/v/d,  edema, headache,recent travel or antibiotics, syncope, exertional chest pain. Feels joint aches all over. B/P up and down 140-170.    November 13, 2009--Presents for B/P follow up. 4 week follow up for HTN    Last visit b/p up  ~170/110. Started on Diovan 160mg . He is tolerating well. B/P avg 110-130. States the diovan is too expensive and would like something less expesive for which he has brought a list of alternatives. Cozaar is on list. Denies chest pain, dyspnea, orthopnea, hemoptysis, fever, n/v/d, edema, headache.   September 22, 2010 --Presents for an acute office visit. Complains of prod cough with clear sticky mucus, SOB, hoarseness, sore throat, wheezing x3days, states had nausea and diarrhea for 2days prior but is now resolved. Has felt feverish and chills, body aches.  Denies chest pain, dyspnea, orthopnea, hemoptysis, fever, n/v/d, edema, headache. No help with OTC. Throat very sore last 2 days.   Medications Prior to Update: 1)  Aspirin 81 Mg Tbec (Aspirin) .... Take 1 Tablet By Mouth Once A Day 2)  Amlodipine Besylate 5 Mg Tabs (Amlodipine Besylate) .Marland Kitchen.. 1 By Mouth Daily 3)  Losartan Potassium 50 Mg Tabs (Losartan Potassium) .Marland Kitchen.. 1 By Mouth Once Daily 4)  Omeprazole 20 Mg  Cpdr (Omeprazole) .... Take One Tab By Mouth Once Daily.Marland KitchenMarland Kitchen 5)  Tramadol Hcl 50 Mg Tabs (Tramadol Hcl) .Marland Kitchen.. 1 Tabs By Mouth Three Times  A Day As Needed Pain 6)  Flexeril 10 Mg  Tabs (Cyclobenzaprine Hcl) .... Take One Tablet At Bedtime As Needed... 7)  Alprazolam 0.5 Mg  Tabs (Alprazolam) .... Take One Tablet Three Times A Day As Needed For Nerves...  Current Medications (verified): 1)  Aspirin 81 Mg Tbec (Aspirin) .... Take 1 Tablet By Mouth Once A Day 2)  Amlodipine Besylate 5 Mg Tabs (Amlodipine Besylate) .Marland Kitchen.. 1 By Mouth Daily 3)  Losartan Potassium 50 Mg Tabs (Losartan Potassium) .Marland Kitchen.. 1 By Mouth Once Daily 4)  Omeprazole 20 Mg  Cpdr (Omeprazole) .... Take One Tab By Mouth Once Daily.Marland KitchenMarland Kitchen 5)  Tramadol Hcl 50 Mg Tabs  (Tramadol Hcl) .Marland Kitchen.. 1 Tabs By Mouth Three Times A Day As Needed Pain 6)  Flexeril 10 Mg  Tabs (Cyclobenzaprine Hcl) .... Take One Tablet At Bedtime As Needed... 7)  Alprazolam 0.5 Mg  Tabs (Alprazolam) .... Take One Tablet Three Times A Day As Needed For Nerves...  Allergies (verified): No Known Drug Allergies  Past History:  Past Medical History: Last updated: 2010/07/24 ALLERGIC RHINITIS (ICD-477.9) ASTHMA, MILD (ICD-493.90) HYPERTENSION (ICD-401.9) VENOUS INSUFFICIENCY (ICD-459.81) HYPERLIPIDEMIA (ICD-272.4) DIABETES MELLITUS, BORDERLINE (ICD-790.29) OBESITY (ICD-278.00) GERD (ICD-530.81) DIVERTICULOSIS OF COLON (ICD-562.10) COLONIC POLYPS (ICD-211.3) HEMORRHOIDS (ICD-455.6) BACK PAIN, LUMBAR (ICD-724.2) ANXIETY DISORDER, GENERALIZED (ICD-300.02) DEPRESSION (ICD-311)  Past Surgical History: Last updated: 07/24/10 S/P lumbar laminectomy in 1989 & 1994  Family History: Last updated: 07/24/2010 Father died age 20 from DM, ASHD, stroke...  he also had a hx of colon cancer & prostate cancer. 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 on disability since 3/05 due to his back  Risk Factors: Smoking Status: never (Jul 24, 2010)  Review of Systems      See HPI  Vital Signs:  Patient profile:   60 year old male Height:      75 inches Weight:      249.50 pounds BMI:     31.30 O2 Sat:      96 % on Room air Temp:     98.8 degrees F oral Pulse rate:   108 / minute BP sitting:   114 / 76  (right arm) Cuff size:   regular  Vitals Entered By: Boone Master CNA/MA (September 22, 2010 2:09 PM)  O2 Flow:  Room air CC: prod cough with clear sticky mucus, SOB, hoarseness, wheezing x3days, states had nausea and diarrhea for 2days prior Is Patient Diabetic? No Comments Medications  reviewed with patient Daytime contact number verified with patient. Boone Master CNA/MA  September 22, 2010 2:08 PM    Physical Exam  Additional Exam:  WD, Overweight, 60 y/o WM in NAD... GENERAL:  Alert & oriented HEENT:  [place]/AT,  PERRLA, EACs-clear, TMs-wnl, NOSE-clear, THROAT-clear & wnl. NECK:  Supple w/ fairROM; no JVD; normal carotid impulses w/o bruits; no thyromegaly or nodules palpated; no lymphadenopathy. CHEST:  Clear to P & A; without wheezes/ rales/ or rhonchi; he has mult trigger points... HEART:  Regular Rhythm;  gr1/6 SEM, no rubs or gallops heard... ABDOMEN:  Soft & nontender; normal bowel sounds; no organomegaly or masses detected. EXT: without deformities, mild arthritic changes; no varicose veins/ +venous insuffic/ tr edema. NEURO:  CN's intact;  no focal neuro deficits... DERM:  No lesions noted; no rash etc...      Impression & Recommendations:  Problem # 1:  UPPER RESPIRATORY INFECTION, ACUTE (ICD-465.9) URI w/mild asthma flare Plan :  albuterol neb  in office  Ceftin 500mg  two times a day for 7 days  Mucinex DM two times a day as needed cough/congestion  Prednisone taper over next week.  Increase fluids and rest  Please contact office for sooner follow up if symptoms do not improve or worsen  His updated medication list for this problem includes:    Aspirin 81 Mg Tbec (Aspirin) .Marland Kitchen... Take 1 tablet by mouth once a day  Orders: TLB-Beta Strep Screen (87880-STRSCR) Est. Patient Level IV (04540)  Medications Added to Medication List This Visit: 1)  Ceftin 500 Mg Tabs (Cefuroxime axetil) .Marland Kitchen.. 1 by mouth two times a day 2)  Prednisone 10 Mg Tabs (Prednisone) .... 4 tabs for 2 days, then 3 tabs for 2 days, 2 tabs for 2 days, then 1 tab for 2 days, then stop  Patient Instructions: 1)  Ceftin 500mg  two times a day for 7 days  2)  Mucinex DM two times a day as needed cough/congestion  3)  Prednisone taper over next week.  4)  Increase fluids and rest  5)   Please contact office for sooner follow up if symptoms do not improve or worsen  Prescriptions: PREDNISONE 10 MG TABS (PREDNISONE) 4 tabs for 2 days, then 3 tabs for 2 days, 2 tabs for 2 days, then 1 tab for 2 days, then stop  #20 x 0   Entered by:   Boone Master CNA/MA   Authorized by:   Rubye Oaks NP   Signed by:   Boone Master CNA/MA on 09/22/2010   Method used:   Reprint   RxID:   [id number] CEFTIN 500 MG TABS (CEFUROXIME AXETIL) 1 by mouth two times a day  #14 x 0   Entered by:   Boone Master CNA/MA   Authorized by:   Rubye Oaks NP   Signed by:   Boone Master CNA/MA on 09/22/2010   Method used:   Reprint   RxID:   [id number] PREDNISONE 10 MG TABS (PREDNISONE) 4 tabs for 2 days, then 3 tabs for 2 days, 2 tabs for 2 days, then 1 tab for 2 days, then stop  #20 x 0   Entered and Authorized by:   Rubye Oaks NP   Signed by:   Rubye Oaks NP on 09/22/2010   Method used:   Electronically to        [id number] (retail)       [address] [place], [place]  [address]       Ph: [phone]       Fax: [phone]   RxID:   [id number] CEFTIN 500 MG TABS (CEFUROXIME AXETIL) 1 by mouth two times a day  #14 x 0   Entered and Authorized by:   Rubye Oaks NP   Signed by:   Rubye Oaks NP on 09/22/2010   Method used:   Electronically to        [facility] (retail)       [address] [place], [place]  [address]       Ph: [phone]       Fax: [phone]   RxID:   [id number]

## 2010-10-02 NOTE — Assessment & Plan Note (Signed)
Summary: 6 months/apc   CC:  6 month ROV & review of mult medical problems....  History of Present Illness: 60 y/o WM here for a follow up visit... he has mult med issues as noted below... he has a hx of a chronic pain syndrome and mult somatic complaints... he is on disability due to LBP & chr pain syndrome- followed by DrYates & Otelia Sergeant, DrBeane & [place], DrTruslow... we fill out his [employer] disability paper yearly (in May).   ~  Oct09:  he tells me that he was seen in the ER 2-3 months ago for abd pain- he had a CT Abd which showed fat in liver/ pancreas, divertics, no renal stones etc...  and lab work showing no blood in stool, Hg= 18, Chem= norm, UA= clear... he worries about his BP and complains about his back... he has already had his seasonal flu shot.   ~  May10:  he is here for his disability papers and he talks nonstop about his disability and former problems w/ work... as best I can tell, when I can get him on point, he has no specific complaints or concerns (except the cost of his generic DCN100)...  ~  Nov10:  mult somatic complaints but it appears as though he's doing reasonably well overall... he notes "superficial veins popping out on my chest when I look in the mirror" & exam of his chest wall is normal... he has continued LBP issues treated by Rhe[facility] Ortho [facility] Neurosurg... he is concerned about formulary changes for 2011.   ~  Jan 13, 2010:  he continues to have mult minor somatic complaints, but overall appears stable on current regimen... Losartan was added to his antihypertensive regimen w/ improvement in his home BP checks... he has also lost 20# on diet!!!  He is due for CXR, fasting labs, and f/u colonoscopy (refer to DrStark)... he is quite concerned that his cousin will not drive him here for the colonoscopy & asks that I write a letter indicatng that this is important for him...   ~  July 15, 2010:  he's had a good 68mo- no new complaints or concerns today... he had  colonoscopy 7/11 by DrStark w/ divertics, hems, & 4mm polyp removed= tubular adenoma & f/u planned 71yrs... we completed his disability papers for him 6/11- disabled due to LBP & anxiety disorder... his breathing is stable;  BP controlled on meds;  Chol looks good on diet alone, but he needs to get his wt down & we reviewed diet recs & exercise perscription;  he remains quite anxious & has Alpraz for Prn use- again offered psychiatric consult but he declines...   Current Problem List:  ALLERGIC RHINITIS (ICD-477.9) - hx mild AR and sinusitis in past... prev eval from DrESL.  ASTHMA, MILD (ICD-493.90) - hx mild AB in the past... prev on Advair & Singulair... no recent URI's etc.  HYPERTENSION (ICD-401.9) - controlled on diet, low sodium, ASA 81mg , AMLODIPINE 5mg /d, & LOSARTAN 50mg /d... BP= 110/70 today & similar at home now... taking meds regularly & tol well- his ROS is generally pos but no signif CP, palpit, dyspnea, edema.  VENOUS INSUFFICIENCY (ICD-459.81) - only tr edema as noted... REC ~ low sodium/ elevate/ TED's...  HYPERLIPIDEMIA (ICD-272.4) - on diet alone...  ~  FLP 10/08 showed TChol 153, TG 230, HDL 29, LDL 93... needs better diet, consider fibrate Rx.  ~  FLP 10/09 (wt= 250#) showed TChol 133, TG 208, HDL 31, LDL 65... may need fibrate Rx.  ~  FLP 11/10 showed TChol 131, TG 184, HDL 29, LDL 65... rec> better low fat diet, get wt down!  ~  FLP 5/11 showed TChol 110, TG 174, HDL 28, LDL 47  DIABETES MELLITUS, BORDERLINE (ICD-790.29) - on diet alone w/ weight reduction as noted...  ~  prev labs w/ BS as high as 136... no problem since then...  ~  labs in 2007 showed BS ~ 100, HgA1c=5.1.Marland KitchenMarland Kitchen diet Rx alone, pt encouraged to lose weight...  ~  labs 10/08 showed BS=107  ~  labs 10/09 showed BS= 81, A1c= 5.1  ~  labs 11/10 showed BS= 124  ~  labs 5/11 showed BS= 110, A1c= 5.2  OBESITY (ICD-278.00) - not really on diet or exercise program... states he's walking some and mowing the yard  slowly...   ~  weight 5/09 = 269#  ~  weight 10/09 = 250#  ~  weight 5/10 = 262#  ~  weight 11/10 = 273#... we discussed diet + exercise!  ~  weight 5/11 = 250#... on diet & walking.  ~  weight 11/11 = 250#  GERD (ICD-530.81) - on OMEPRAZOLE 20mg /d... it controls his symptoms well...  DIVERTICULOSIS OF COLON (ICD-562.10), COLONIC POLYPS (ICD-211.3), HEMORRHOIDS (ICD-455.6) - f/u colon due & we will refer to DrStark.  ~  colonoscopy 12/05 by DrStark showed divertics & several polyps= ?path...  ~  colonoscopy 7/11 by DrStark showed divertics, hems, & 4mm polyp= tub adenoma... f/u 63yrs.  BACK PAIN, LUMBAR (ICD-724.2) - he takes FLEXERIL 10mg Qhs & TRAMADOL 50mg  Tid for pain... he notes that he has new insurance and [facility] & Dor[facility] Bullion are not on the list... he is going to see DrTooke for his opinion...  ~  he notes that DrYates wanted to do surg;  DrTooke disagreed.  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 DEPRESSION (ICD-311) - I offered psychiatric referral but he declines...  Health Maintenance:  he gets the seasonal Flu vaccine yearly...   Preventive Screening-Counseling & Management  Alcohol-Tobacco     Smoking Status: never  Allergies (verified): No Known Drug Allergies  Comments:  Nurse/Medical Assistant: The patient's medications and allergies were reviewed with the patient and were updated in the Medication and Allergy Lists.  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 Father died age 89 from DM, ASHD, stroke...  he also had a hx of colon cancer & prostate cancer. Mother died age 57 from metastatic lung cancer No siblings  Social History: Reviewed history from 01/23/2009 and no changes required. Single, never married No children Never smoked  No alcohol prev employed doing Naval architect work for [employer] he's been on disability since 3/05 due to his back  Review of Systems      See HPI       The patient complains of decreased hearing and dyspnea on exertion.  The patient denies anorexia, fever, weight loss, weight gain, vision loss, hoarseness, chest pain, syncope, peripheral edema, prolonged cough, headaches, hemoptysis, abdominal pain, melena, hematochezia, severe indigestion/heartburn, hematuria, incontinence, muscle weakness, suspicious skin lesions, transient blindness, difficulty walking, depression, unusual weight change, abnormal bleeding, enlarged lymph nodes, and angioedema.  Vital Signs:  Patient profile:   60 year old male Height:      75 inches Weight:      249.50 pounds BMI:     31.30 O2 Sat:      96 % on Room air Temp:     97.0 degrees F oral Pulse rate:   76 / minute BP sitting:   110 / 72  (left arm) Cuff size:   large  Vitals Entered By: Randell Loop CMA (July 15, 2010 10:51 AM)  O2 Sat at Rest %:  96 O2 Flow:  Room air CC: 6 month ROV & review of mult medical problems... Is Patient Diabetic? No Pain Assessment Patient in pain? no      Comments no changes in meds today   Physical Exam  Additional Exam:  WD, Overweight, 60 y/o WM in NAD... GENERAL:  Alert & oriented; he has a depressed mood & sl flat affect... HEENT:  /AT, EOM-wnl, PERRLA, EACs-clear, TMs-wnl, NOSE-clear, THROAT-clear & wnl. NECK:  Supple w/ fairROM; no JVD; normal carotid impulses w/o bruits; no thyromegaly or nodules palpated; no  lymphadenopathy. CHEST:  Clear to P & A; without wheezes/ rales/ or rhonchi; he has mult trigger points... HEART:  Regular Rhythm;  gr1/6 SEM, no rubs or gallops heard... ABDOMEN:  Soft & nontender; normal bowel sounds; no organomegaly or masses detected. EXT: without deformities, mild arthritic changes; no varicose veins/ +venous insuffic/ tr edema. NEURO:  CN's intact;  no focal neuro deficits... DERM:  No lesions noted; no rash etc...    Impression & Recommendations:  Problem # 1:  ASTHMA, MILD (ICD-493.90) No recent exac>  doing well w/o problems...  Problem # 2:  HYPERTENSION (ICD-401.9) Controlled>  continue same... His updated medication list for this problem includes:    Amlodipine Besylate 5 Mg Tabs (Amlodipine besylate) .Marland Kitchen... 1 by mouth daily    Losartan Potassium 50 Mg Tabs (Losartan potassium) .Marland Kitchen... 1 by mouth once daily  Problem # 3:  VENOUS INSUFFICIENCY (ICD-459.81) He knows to elim sodium, elevate, wear support hose...  Problem # 4:  HYPERLIPIDEMIA (ICD-272.4) Controlled on diet alone...  Problem # 5:  OBESITY (ICD-278.00) Diet + exercise w/ wt reduction is discussed w/ the pt today...  Problem # 6:  COLONIC POLYPS (ICD-211.3) S/p colon by drStark 7/11 as noted...  Problem # 7:  BACK PAIN, LUMBAR (ICD-724.2) He manages his chr pain problem very well... His updated medication list for this problem includes:    Aspirin 81 Mg Tbec (Aspirin) .Marland Kitchen... Take 1 tablet by mouth once a day    Tramadol Hcl 50 Mg Tabs (Tramadol hcl) .Marland Kitchen... 1 tabs by mouth three times a day as needed pain    Flexeril 10 Mg Tabs (Cyclobenzaprine hcl) .Marland Kitchen... Take one tablet at bedtime as needed...  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 His updated medication list for this problem includes:    Alprazolam 0.5 Mg Tabs (Alprazolam) .Marland Kitchen... Take one tablet three times a day as needed for nerves...  Complete Medication List: 1)  Aspirin 81 Mg Tbec (Aspirin)  .... Take 1 tablet by mouth once a day 2)  Amlodipine Besylate 5 Mg Tabs (Amlodipine besylate) .Marland Kitchen.. 1 by mouth daily 3)  Losartan Potassium 50 Mg Tabs (Losartan potassium) .Marland Kitchen.. 1 by mouth once daily 4)  Omeprazole 20 Mg Cpdr (Omeprazole) .... Take one tab by mouth once daily.Marland KitchenMarland Kitchen 5)  Tramadol Hcl 50 Mg Tabs (Tramadol hcl) .Marland Kitchen.. 1 tabs by mouth three times  a day as needed pain 6)  Flexeril 10 Mg Tabs (Cyclobenzaprine hcl) .... Take one tablet at bedtime as needed... 7)  Alprazolam 0.5 Mg Tabs (Alprazolam) .... Take one tablet three times a day as needed for nerves...  Patient Instructions: 1)  Today we updated your med list- see below.... 2)  Continue your current meds the same... 3)  Let's get on track w/ our diet & exercise program> the goal is to lose 15-20 lbs... 4)  Call for any problems.Marland KitchenMarland Kitchen 5)  Please schedule a follow-up appointment in 6 months.

## 2010-10-02 NOTE — Letter (Signed)
Summary: [facility] Instructions  [facility]  [address] [place], [place] [address]   Phone: [phone]  Fax: [phone]       UNO ESAU    [birth year]    MRN: [id number]        Procedure Day /Date: Tuesday 03-25-10     Arrival Time: 9:00 a.m.     Procedure Time: 10:00 a.m.     Location of Procedure:                    _x_  [facility] (4th Floor)   PREPARATION FOR COLONOSCOPY WITH MOVIPREP   Starting 5 days prior to your procedure  03-20-10  do not eat nuts, seeds, popcorn, corn, beans, peas,  salads, or any raw vegetables.  Do not take any fiber supplements (e.g. Metamucil, Citrucel, and Benefiber).  THE DAY BEFORE YOUR PROCEDURE         DATE:  03-24-10  DAY: Monday  1.  Drink clear liquids the entire day-NO SOLID FOOD  2.  Do not drink anything colored red or purple.  Avoid juices with pulp.  No orange juice.  3.  Drink at least 64 oz. (8 glasses) of fluid/clear liquids during the day to prevent dehydration and help the prep work efficiently.  CLEAR LIQUIDS INCLUDE: Water Jello Ice Popsicles Tea (sugar ok, no milk/cream) Powdered fruit flavored drinks Coffee (sugar ok, no milk/cream) Gatorade Juice: apple, white grape, white cranberry  Lemonade Clear bullion, consomm, broth Carbonated beverages (any kind) Strained chicken noodle soup Hard Candy                             4.  In the morning, mix first dose of MoviPrep solution:    Empty 1 Pouch A and 1 Pouch B into the disposable container    Add lukewarm drinking water to the top line of the container. Mix to dissolve    Refrigerate (mixed solution should be used within 24 hrs)  5.  Begin drinking the prep at 5:00 p.m. The MoviPrep container is divided by 4 marks.   Every 15 minutes drink the solution down to the next mark (approximately 8 oz) until the full liter is complete.   6.  Follow completed prep with 16 oz of clear liquid of your choice (Nothing red or purple).   Continue to drink clear liquids until bedtime.  7.  Before going to bed, mix second dose of MoviPrep solution:    Empty 1 Pouch A and 1 Pouch B into the disposable container    Add lukewarm drinking water to the top line of the container. Mix to dissolve    Refrigerate  THE DAY OF YOUR PROCEDURE      DATE:  03-25-10  DAY:  Tuesday  Beginning at  5:00 a.m. (5 hours before procedure):         1. Every 15 minutes, drink the solution down to the next mark (approx 8 oz) until the full liter is complete.  2. Follow completed prep with 16 oz. of clear liquid of your choice.    3. You may drink clear liquids until  8:00 a.m. (2 HOURS BEFORE PROCEDURE).   MEDICATION INSTRUCTIONS  Unless otherwise instructed, you should take regular prescription medications with a small sip of water   as early as possible the morning of your procedure.          OTHER INSTRUCTIONS  You will need a responsible adult at least 59 years of age to accompany you and drive you home.   This person must remain in the waiting room during your procedure.  Wear loose fitting clothing that is easily removed.  Leave jewelry and other valuables at home.  However, you may wish to bring a book to read or  an iPod/MP3 player to listen to music as you wait for your procedure to start.  Remove all body piercing jewelry and leave at home.  Total time from sign-in until discharge is approximately 2-3 hours.  You should go home directly after your procedure and rest.  You can resume normal activities the  day after your procedure.  The day of your procedure you should not:   Drive   Make legal decisions   Operate machinery   Drink alcohol   Return to work  You will receive specific instructions about eating, activities and medications before you leave.    The above instructions have been reviewed and explained to me by   Wyona Almas RN  March 11, 2010 10:29 AM     I fully understand and can  verbalize these instructions _____________________________ Date _________

## 2010-10-02 NOTE — Miscellaneous (Signed)
Summary: [facility] Previsit/prep  Clinical Lists Changes  Medications: Added new medication of MOVIPREP 100 GM  SOLR (PEG-KCL-NACL-NASULF-NA ASC-C) As per prep instructions. - Signed Rx of MOVIPREP 100 GM  SOLR (PEG-KCL-NACL-NASULF-NA ASC-C) As per prep instructions.;  #1 x 0;  Signed;  Entered by: Wyona Almas RN;  Authorized by: Meryl Dare MD [facility];  Method used: Electronically to [id number] [address] [place], [place], [place]  [address], Ph: [phone], Fax: [phone] Observations: Added new observation of NKA: T (03/11/2010 9:29)    Prescriptions: MOVIPREP 100 GM  SOLR (PEG-KCL-NACL-NASULF-NA ASC-C) As per prep instructions.  #1 x 0   Entered by:   Wyona Almas RN   Authorized by:   Meryl Dare MD [facility]   Signed by:   Wyona Almas RN on 03/11/2010   Method used:   Electronically to        [id number] (retail)       [address] [place], [place]  [address]       Ph: [phone]       Fax: [phone]   RxID:   [id number]

## 2010-10-02 NOTE — Letter (Signed)
Summary: Patient Notice- Polyp Results  [facility]  [address] [place], [place] [address]   Phone: [phone]  Fax: [phone]        March 27, 2010 MRN: [id number]    [name] [name] [address] [place], [place]  [address]    Dear Mr. Ran,  I am pleased to inform you that the colon polyp(s) removed during your recent colonoscopy was (were) found to be benign (no cancer detected) upon pathologic examination.  I recommend you have a repeat colonoscopy examination in 5 years to look for recurrent polyps, as having colon polyps increases your risk for having recurrent polyps or even colon cancer in the future.  Should you develop new or worsening symptoms of abdominal pain, bowel habit changes or bleeding from the rectum or bowels, please schedule an evaluation with either your primary care physician or with me.  Continue treatment plan as outlined the day of your exam.  Please call us if you are having persistent problems or have questions about your condition that have not been fully answered at this time.  Sincerely,  Meryl Dare MD [facility]  This letter has been electronically signed by your physician.  Appended Document: Patient Notice- Polyp Results letter mailed 8.1.2011

## 2010-10-02 NOTE — Procedures (Signed)
Summary: Colonoscopy  Patient: [name] [name] Note: All result statuses are Final unless otherwise noted.  Tests: (1) Colonoscopy (COL)   COL Colonoscopy           DONE (C)     [facility]     [address]     [place], [place]  [address]           COLONOSCOPY PROCEDURE REPORT           PATIENT:  [name] [name], [name] [name]  MR#:  [id number]     BIRTHDATE:  August 09, 1951, 59 yrs. old  GENDER:  male     ENDOSCOPIST:  Judie Petit T. Russella Dar, MD, [facility]     PROCEDURE DATE:  03/25/2010     PROCEDURE:  Colonoscopy with biopsy     ASA CLASS:  Class II     INDICATIONS:  1) follow-up of polyp  2) family history of colon     cancer  3) surveillance and high-risk screening, adenomatous     polyp, 07/2004. F and MGM with colon cancer.     MEDICATIONS:   Fentanyl 100 mcg IV, Versed 10 mg IV,Benadryl 50     DESCRIPTION OF PROCEDURE:   After the risks benefits and     alternatives of the procedure were thoroughly explained, informed     consent was obtained.  Digital rectal exam was performed and     revealed no abnormalities.   The LB [id number] endoscope     was introduced through the anus and advanced to the cecum, which     was identified by both the appendix and ileocecal valve, without     limitations.  The quality of the prep was excellent, using     MoviPrep.  The instrument was then slowly withdrawn as the colon     was fully examined.     <<PROCEDUREIMAGES>>     FINDINGS:  Scattered diverticula were found in the transverse     colon.  Moderate diverticulosis was found in the sigmoid colon.  A     sessile polyp was found in the distal transverse colon. It was 4     mm in size. The polyp was removed using cold biopsy forceps.  This     was otherwise a normal examination of the colon.   Retroflexed     views in the rectum revealed internal hemorrhoids, small.  The     time to cecum =  2.5  minutes. The scope was then withdrawn (time     =  10  min) from the patient and the procedure  completed.           COMPLICATIONS:  None           ENDOSCOPIC IMPRESSION:     1) Diverticula, scattered in the transverse colon     2) Moderate diverticulosis in the sigmoid colon     3) 4 mm sessile polyp in the distal transverse colon     4) Internal hemorrhoids           RECOMMENDATIONS:     1) High fiber diet with liberal fluid intake.     2) Await pathology results     3) Repeat Colonoscopy in 5 years pending pathology review           Malcolm T. Russella Dar, MD, [facility]           CC: Michele Mcalpine, MD           n.  REVISED:  04/15/2010 09:48 AM     eSIGNED:   Judie Petit T. Stark at 04/15/2010 09:48 AM           Dallie Piles, [id number]  Note: An exclamation mark (!) indicates a result that was not dispersed into the flowsheet. Document Creation Date: 04/15/2010 9:50 AM _______________________________________________________________________  (1) Order result status: Final Collection or observation date-time: 03/25/2010 10:56 Requested date-time:  Receipt date-time:  Reported date-time:  Referring Physician:   Ordering Physician: Claudette Head [phone]) Specimen Source:  Source: Launa Grill Order Number: [id number] Lab site:

## 2010-10-02 NOTE — Letter (Signed)
Summary: [facility]  [address]   [place], [place] [address]   Phone: [phone]  Fax: [phone]    01/13/2010      To Whom It May Concern,    Mr. Kenon Delashmit is to be scheduled for a colonoscopy soon and due to the anesthesia used,  he will need a family member to transport him home after this procedure.          Sincerely,      Lonzo Cloud. Elayne Snare . D.

## 2010-10-02 NOTE — Assessment & Plan Note (Signed)
Summary: Acute NP office visit - HTN   CC:  elevated BP (up to 170/110) with HA, both ears clogged, "throbbing" neck, stiffness in back of neck, and chest discomfort x5days.  History of Present Illness: 60  y/o WM  with known hx of HTN, chronic pain syndrome, DJD   follow up visit... he has mult med issues as noted below... he has a hx of a chronic pain syndrome and mult somatic complaints... he is on disability due to LBP & chr pain syndrome- followed by DrYates & Otelia Sergeant, DrBeane & Aplington, DrTruslow... we filled out his disability form in May09 & in May10...   ~  June 19, 2008:  he tells me that he was seen in the ER 2-3 months ago for abd pain- he had a CT Abd which showed fat in liver/ pancreas, divertics, no renal stones etc...  and lab work showing no blood in stool, Hg= 18, Chem= norm, UA= clear... he worries about his BP and complains about his back... he has already had his seasonal flu shot.   ~  01-27-2009:  he is here for his disability papers and he talks nonstop about his disability and former problems w/ work... as best I can tell, when I can get him on point, he has no specific complaints or concerns (except the cost of his generic DCN100)...   ~  July 15, 2009:  mult somatic complaints but it appears as though he's doing reasonably well overall... he notes "superficial veins popping out on my chest when I lookin the mirror" & exam of his chest wall is normal... he has continued LBP issues treated by [facility]o [facility] Neuros[facility]... he is concerned about formulary changes for 2011 & he will research this & let us know what he wants to do...  October 16, 2009 --Presents for work in visit. Complains of elevated BP (up to 170/110) with HA, both ears clogged, "throbbing" neck, stiffness in back of neck, chest discomfort x5days. Labs reviewed from last visit, w/ nml TSH.  Denies decongestants. Drinks daily caffeine- soda. Denies, palpitations, dyspnea, orthopnea, hemoptysis, fever,  n/v/d, edema, headache,recent travel or antibiotics, syncope, exertional chest pain. Feels joint aches all over. B/P up and down 140-170.      Medications Prior to Update: 1)  Amlodipine Besylate 5 Mg Tabs (Amlodipine Besylate) .Marland Kitchen.. 1 By Mouth Daily 2)  Omeprazole 20 Mg  Cpdr (Omeprazole) .... Take One Tab By Mouth Once Daily.Marland KitchenMarland Kitchen 3)  Propoxyphene N-Apap 100-325 Mg Tabs (Propoxyphene N-Apap) .... Take One Tablet By Mouth Every 6 Hours As Needed For Pain 4)  Flexeril 10 Mg  Tabs (Cyclobenzaprine Hcl) .... Take One Tablet At Bedtime As Needed... 5)  Alprazolam 0.5 Mg  Tabs (Alprazolam) .... Take One Tablet Three Times A Day As Needed For Nerves...  Current Medications (verified): 1)  Amlodipine Besylate 5 Mg Tabs (Amlodipine Besylate) .Marland Kitchen.. 1 By Mouth Daily 2)  Omeprazole 20 Mg  Cpdr (Omeprazole) .... Take One Tab By Mouth Once Daily.Marland KitchenMarland Kitchen 3)  Flexeril 10 Mg  Tabs (Cyclobenzaprine Hcl) .... Take One Tablet At Bedtime As Needed... 4)  Alprazolam 0.5 Mg  Tabs (Alprazolam) .... Take One Tablet Three Times A Day As Needed For Nerves.Marland KitchenMarland Kitchen 5)  Aspirin 81 Mg Tbec (Aspirin) .... Take 1 Tablet By Mouth Once A Day  Allergies (verified): No Known Drug Allergies  Past History:  Past Surgical History: Last updated: 07/15/2009 S/P lumbar laminectomy in 1989 & 1994  Family History: Last updated: 01/27/09 Father died  age 31 from DM, ASHD, stroke...  he also had a hx of colon cancer & prostate cancer. Mother died age 71 from metastatic lung cancer No siblings  Social History: Last updated: 01/23/2009 Single, never married No children Never smoked  No alcohol prev employed doing Naval architect work for [employer] he's been on disability since 3/05 due to his back...  Risk Factors: Smoking Status: never (01/23/2009)  Past Medical History: ALLERGIC RHINITIS (ICD-477.9) - hx mild AR and sinusitis in past... prev eval from DrESL.  ASTHMA, MILD (ICD-493.90) - hx mild AB in the past... prev on  Advair & Singulair... no recent URI's etc.  HYPERTENSION (ICD-401.9) - controlled on diet, low sodium, and AMLODIPINE 5mg /d.   VENOUS INSUFFICIENCY (ICD-459.81) - only tr edema as noted... REC ~ low sodium/ elevate/ TED's...  HYPERLIPIDEMIA (ICD-272.4) - on diet alone...  ~  FLP 10/08 showed TChol 153, TG 230, HDL 29, LDL 93... needs better diet, consider fibrate Rx.  ~  FLP 10/09 (wt= 250#) showed TChol 133, TG 208, HDL 31, LDL 65... may need fibrate Rx.  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   ~  prev labs w/ BS as high as 136... no problem since then...  ~  labs in 2007 showed BS ~ 100, HgA1c=5.1.Marland KitchenMarland Kitchen diet Rx alone, pt encouraged to lose weight...  ~  labs 10/08 showed BS=107  ~  labs 10/09 showed BS= 81, A1c= 5.1  ~  labs 11/10 showed BS= 124  OBESITY (ICD-278.00) - not really on diet or exercise program... states he's walking some and mowing the yard slowly...   ~  weight 5/09 = 269#  ~  weight 10/09 = 250#  ~  weight 5/10 = 262#  ~  weight 11/10 = 273#... we discussed diet + exercise!  GERD (ICD-530.81) - on OMEPRAZOLE 20mg /d... it controls his symptoms well...  DIVERTICULOSIS OF COLON (ICD-562.10), COLONIC POLYPS (ICD-211.3), HEMORRHOIDS (ICD-455.6) -   ~  last colonoscopy 12/05 by DrStark showed divertics & several polyps= ?path... f/u planned 44yrs.  BACK PAIN, LUMBAR (ICD-724.2) - he takes FLEXERIL 10mg Qhs & DCN100 Tid for pain... he notes that he has new insurance and [facility] & [facility] are not on the list... he is going to see DrTooke for his opinion...  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 DEPRESSION (I   Review of Systems      See HPI  Vital Signs:  Patient profile:   60 year old male Height:      75 inches Weight:      260 pounds BMI:     32.62 O2 Sat:      98 % on Room air Temp:     97.9 degrees F oral Pulse rate:   72 /  minute BP sitting:   160 / 90  (left arm) Cuff size:   large  Vitals Entered By: Boone Master CNA (October 16, 2009 10:37 AM)  O2 Flow:  Room air CC: elevated BP (up to 170/110) with HA, both ears clogged, "throbbing" neck, stiffness in back of neck, chest discomfort x5days Is Patient Diabetic? No Comments Medications reviewed with patient Daytime contact number verified with patient. Boone Master CNA  October 16, 2009 10:38 AM    Physical Exam  Additional Exam:  WD, Overweight, 60 y/o WM in NAD... GENERAL:  Alert & oriented HEENT:  [place]/AT, EOM-wnl, PERRLA,  EACs-clear, TMs-wnl, NOSE-clear, THROAT-clear & wnl. NECK:  Supple w/ fairROM; no JVD; normal carotid impulses w/o bruits; no thyromegaly or nodules palpated; no lymphadenopathy. CHEST:  Clear to P & A; without wheezes/ rales/ or rhonchi; he has mult trigger points... HEART:  Regular Rhythm;  gr1/6 SEM, no rubs or gallops heard... ABDOMEN:  Soft & nontender; normal bowel sounds; no organomegaly or masses detected. EXT: without deformities, mild arthritic changes; no varicose veins/ +venous insuffic/ tr edema. NEURO:  CN's intact;  no focal neuro deficits... DERM:  No lesions noted; no rash etc...  EKG w/ no acute changes.    Impression & Recommendations:  Problem # 1:  HYPERTENSION (ICD-401.9)  b/p not at optimal goal. EKG w/ no acute changes.  will add additional meds for better control.  REC:  Add Diovan 160mg  once daily  Bring blood pressure log to each next office visit.  Low salt diet.  follow up 4 weeks Dr. Kriste Basque or Parrett .May begin Tramadol instead of Darvocet for as needed pain control.  Please contact office for sooner follow up if symptoms do not improve or worsen   His updated medication list for this problem includes:    Amlodipine Besylate 5 Mg Tabs (Amlodipine besylate) .Marland Kitchen... 1 by mouth daily  BP today: 160/90 Prior BP: 128/84 (07/15/2009)  Labs Reviewed: K+: 4.0 (07/16/2009) Creat: : 1.0  (07/16/2009)   Chol: 131 (07/16/2009)   HDL: 29.30 (07/16/2009)   LDL: 65 (07/16/2009)   TG: 184.0 (07/16/2009)  Orders: Est. Patient Level IV (16109)  Problem # 2:       Medications Added to Medication List This Visit: 1)  Aspirin 81 Mg Tbec (Aspirin) .... Take 1 tablet by mouth once a day  Complete Medication List: 1)  Amlodipine Besylate 5 Mg Tabs (Amlodipine besylate) .Marland Kitchen.. 1 by mouth daily 2)  Omeprazole 20 Mg Cpdr (Omeprazole) .... Take one tab by mouth once daily.Marland KitchenMarland Kitchen 3)  Flexeril 10 Mg Tabs (Cyclobenzaprine hcl) .... Take one tablet at bedtime as needed... 4)  Alprazolam 0.5 Mg Tabs (Alprazolam) .... Take one tablet three times a day as needed for nerves.Marland KitchenMarland Kitchen 5)  Aspirin 81 Mg Tbec (Aspirin) .... Take 1 tablet by mouth once a day  Patient Instructions: 1)  Add Diovan 160mg  once daily  2)  Bring blood pressure log to each next office visit.  3)  Low salt diet.  4)  follow up 4 weeks Dr. Kriste Basque or Parrett 5)  .May begin Tramadol instead of Darvocet for as needed pain control.  6)  Please contact office for sooner follow up if symptoms do not improve or worsen    Immunization History:  Pneumovax Immunization History:    Pneumovax:  n/a (10/16/2009)    CardioPerfect ECG  ID: [id number] Patient: [name] [name], [name] [name] DOB: 03-14-1951 Age: 60 Years Old Sex: Male Race: White Physician: Rubye Oaks PCP: Alroy Dust Technician: Boone Master CNA Height: 75 Weight: 260 Status: Unconfirmed Past Medical History:  ALLERGIC RHINITIS (ICD-477.9) ASTHMA, MILD (ICD-493.90) HYPERTENSION (ICD-401.9) VENOUS INSUFFICIENCY (ICD-459.81) HYPERLIPIDEMIA (ICD-272.4) DIABETES MELLITUS, BORDERLINE (ICD-790.29) OBESITY (ICD-278.00) GERD (ICD-530.81) DIVERTICULOSIS OF COLON (ICD-562.10) COLONIC POLYPS (ICD-211.3) HEMORRHOIDS (ICD-455.6) BACK PAIN, LUMBAR (ICD-724.2) ANXIETY DISORDER, GENERALIZED (ICD-300.02) DEPRESSION (ICD-311)   Recorded: 10/16/2009 11:08 AM P/PR: 132 ms / 178 ms -  Heart rate (maximum exercise) QRS: 115 QT/QTc/QTd: 379 ms / 398 ms / 53 ms - Heart rate (maximum exercise)  P/QRS/T axis: 14 deg / -5 deg / 46 deg - Heart rate (maximum exercise)  Heartrate: 71  bpm  Interpretation:   sinus rhythm  slight intraventricular conduction delay   ECG without significant abnormalities

## 2010-10-02 NOTE — Progress Notes (Signed)
Summary: medication samples  Phone Note Call from Patient Call back at Home Phone [phone]   Caller: Patient Call For: nadel Summary of Call: Needs enough samples to last until appt date...appt w/Tammy 11/13/2009 .......Marland Kitchenmedicine diovan.... will run out on 11/11/2009 Initial call taken by: Denna Haggard, CMA,  November 06, 2009 3:26 PM  Follow-up for Phone Call        Samples of diovan 160 left for pt to pick up. LMOM for pt to be made aware. Demontrez Rindfleisch  November 06, 2009 3:34 PM

## 2010-11-14 ENCOUNTER — Telehealth: Payer: Self-pay | Admitting: Pulmonary Disease

## 2010-11-18 NOTE — Progress Notes (Signed)
Summary: refill  Phone Note Call from Patient   Caller: Patient Call For: [facility] Summary of Call: Patient phoned stated that his Alprazolam 0.5 doesnt have any refills on it and he would like a refill he takes 3 aday and only has trhee left. He uses [facility] on [address] and they are not open on the weekend so he was hoping that we could do this today. he can be reached at [phone] its ok to leave a message. Initial call taken by: Vedia Coffer,  November 14, 2010 9:17 AM  Follow-up for Phone Call        Pt states he takes three xanax daily. Last rx given on 06-10-10 #100 x3. Pt staets he only has 3 tabs left. Please advsie if ok to send refill. Carron Curie CMA  November 14, 2010 9:23 AM   ok to send in refills of alprazolam----this med has been called to the  pharmacy and pt is aware Randell Loop CMA  November 14, 2010 4:01 PM      Prescriptions: ALPRAZOLAM 0.5 MG  TABS (ALPRAZOLAM) take one tablet three times a day as needed for nerves...  #100 x 3   Entered by:   Randell Loop CMA   Authorized by:   Michele Mcalpine MD   Signed by:   Randell Loop CMA on 11/14/2010   Method used:   Telephoned to ...       [facility] [phone]* (retail)       [address] [place], [place]  [address]       Ph: [phone]       Fax: [phone]   RxID:   [id number]

## 2011-01-06 ENCOUNTER — Encounter: Payer: Self-pay | Admitting: Pulmonary Disease

## 2011-01-06 ENCOUNTER — Ambulatory Visit: Payer: Self-pay | Admitting: Pulmonary Disease

## 2011-01-06 ENCOUNTER — Ambulatory Visit (INDEPENDENT_AMBULATORY_CARE_PROVIDER_SITE_OTHER): Payer: Medicare PPO | Admitting: Pulmonary Disease

## 2011-01-06 ENCOUNTER — Other Ambulatory Visit (INDEPENDENT_AMBULATORY_CARE_PROVIDER_SITE_OTHER): Payer: Medicare PPO

## 2011-01-06 DIAGNOSIS — E669 Obesity, unspecified: Secondary | ICD-10-CM

## 2011-01-06 DIAGNOSIS — F411 Generalized anxiety disorder: Secondary | ICD-10-CM

## 2011-01-06 DIAGNOSIS — N139 Obstructive and reflux uropathy, unspecified: Secondary | ICD-10-CM

## 2011-01-06 DIAGNOSIS — K219 Gastro-esophageal reflux disease without esophagitis: Secondary | ICD-10-CM

## 2011-01-06 DIAGNOSIS — I10 Essential (primary) hypertension: Secondary | ICD-10-CM

## 2011-01-06 DIAGNOSIS — R7309 Other abnormal glucose: Secondary | ICD-10-CM

## 2011-01-06 DIAGNOSIS — I872 Venous insufficiency (chronic) (peripheral): Secondary | ICD-10-CM

## 2011-01-06 DIAGNOSIS — M545 Low back pain: Secondary | ICD-10-CM

## 2011-01-06 DIAGNOSIS — K5730 Diverticulosis of large intestine without perforation or abscess without bleeding: Secondary | ICD-10-CM

## 2011-01-06 DIAGNOSIS — E785 Hyperlipidemia, unspecified: Secondary | ICD-10-CM

## 2011-01-06 LAB — HEPATIC FUNCTION PANEL
ALT: 24 U/L (ref 0–53)
Bilirubin, Direct: 0.1 mg/dL (ref 0.0–0.3)
Total Bilirubin: 0.9 mg/dL (ref 0.3–1.2)

## 2011-01-06 LAB — CBC WITH DIFFERENTIAL/PLATELET
Basophils Relative: 0.3 % (ref 0.0–3.0)
Eosinophils Absolute: 0.1 10*3/uL (ref 0.0–0.7)
HCT: 48.1 % (ref 39.0–52.0)
Hemoglobin: 17.2 g/dL — ABNORMAL HIGH (ref 13.0–17.0)
Lymphs Abs: 1.4 10*3/uL (ref 0.7–4.0)
MCHC: 35.7 g/dL (ref 30.0–36.0)
MCV: 90.3 fl (ref 78.0–100.0)
Monocytes Absolute: 0.7 10*3/uL (ref 0.1–1.0)
Neutro Abs: 4.3 10*3/uL (ref 1.4–7.7)
RBC: 5.32 Mil/uL (ref 4.22–5.81)

## 2011-01-06 LAB — BASIC METABOLIC PANEL
BUN: 11 mg/dL (ref 6–23)
GFR: 66.45 mL/min (ref >60.00)
Glucose, Bld: 100 mg/dL — ABNORMAL HIGH (ref 70–99)
Potassium: 4.8 mEq/L (ref 3.5–5.1)

## 2011-01-06 LAB — LIPID PANEL: VLDL: 33.8 mg/dL (ref 0.0–40.0)

## 2011-01-06 NOTE — Progress Notes (Signed)
Subjective:    Patient ID: [name] [name], male    DOB: 04/25/1951, 60 y.o.   MRN: [id number]  HPI 60 y/o WM here for a follow up visit... he has mult med issues as noted below... he has a hx of a chronic pain syndrome and mult somatic complaints... he is on disability due to LBP & chr pain syndrome- followed by DrYates & Otelia Sergeant, DrBeane & Aplington, DrTruslow... we fill out his [employer] disability paper yearly (in May).  ~  Jan 13, 2010:  he continues to have mult minor somatic complaints, but overall appears stable on current regimen... Losartan was added to his antihypertensive regimen w/ improvement in his home BP checks... he has also lost 20# on diet!!!  He is due for CXR, fasting labs, and f/u colonoscopy (refer to DrStark)... he is quite concerned that his cousin will not drive him here for the colonoscopy & asks that I write a letter indicatng that this is important for him...  ~  July 15, 2010:  he's had a good 4mo- no new complaints or concerns today... he had colonoscopy 7/11 by DrStark w/ divertics, hems, & 4mm polyp removed= tubular adenoma & f/u planned 7yrs... we completed his disability papers for him 6/11- disabled due to LBP & anxiety disorder... his breathing is stable;  BP controlled on meds;  Chol looks good on diet alone, but he needs to get his wt down & we reviewed diet recs & exercise perscription;  he remains quite anxious & has Alpraz for Prn use- again offered psychiatric consult but he declines...  ~  Jan 06, 2011:  4mo ROV & he continues w/ mult somatic complaints> but overall stable:  BP controlled on meds; Chol & BS look good on today's fasting labs; weight is trending down slowly; he continues on tramadol, Flexeril, Alprazolam for his discomfort...        Problem List:  ALLERGIC RHINITIS (ICD-477.9) - hx mild AR and sinusitis in past... prev eval from DrESL.  ASTHMA, MILD (ICD-493.90) - hx mild AB in the past... prev on Advair & Singulair... no recent URI's  etc.  HYPERTENSION (ICD-401.9) - controlled on diet, low sodium, ASA 81mg , AMLODIPINE 5mg /d, & LOSARTAN 50mg /d... BP= 118/84 today & similar at home now... taking meds regularly & tol well- his ROS is generally pos but no signif CP, palpit, dyspnea, edema. ~  CXR 5/11 was clear 7 WNL... ~  EKG 5/11 showed NSR, WNL, NAD...  VENOUS INSUFFICIENCY (ICD-459.81) - only tr edema as noted... REC~ low sodium/ elevate/ TED's...  HYPERLIPIDEMIA (ICD-272.4) - on diet alone... ~  FLP 10/08 showed TChol 153, TG 230, HDL 29, LDL 93... needs better diet, consider fibrate Rx. ~  FLP 10/09 (wt= 250#) showed TChol 133, TG 208, HDL 31, LDL 65... may need fibrate Rx. ~  FLP 11/10 showed TChol 131, TG 184, HDL 29, LDL 65... rec> better low fat diet, get wt down! ~  FLP 5/11 showed TChol 110, TG 174, HDL 28, LDL 47 ~  FLP 5/12 showed TChol 121, TG 169, HDL 29, LDL 59... Needs better low fat diet.  DIABETES MELLITUS, BORDERLINE (ICD-790.29) - on diet alone w/ weight reduction as noted... ~  prev labs w/ BS as high as 136... no problem since then... ~  labs in 2007 showed BS~ 100, HgA1c=5.1.Marland KitchenMarland Kitchen diet Rx alone, pt encouraged to lose weight... ~  labs 10/08 showed BS=107 ~  labs 10/09 showed BS= 81, A1c= 5.1 ~  labs 11/10 showed BS=  124 ~  labs 5/11 showed BS= 110, A1c= 5.2 ~  Labs 5/12 showed BS= 100  OBESITY (ICD-278.00) - not really on diet or exercise program... states he's walking some and mowing the yard slowly...  ~  weight 5/09 = 269# ~  weight 10/09 = 250# ~  weight 5/10 = 262# ~  weight 11/10 = 273#... we discussed diet + exercise! ~  weight 5/11 = 250#... on diet & walking. ~  weight 11/11 = 250# ~  Weight 5/12 = 246#  GERD (ICD-530.81) - on OMEPRAZOLE 20mg /d... it controls his symptoms well...  DIVERTICULOSIS OF COLON (ICD-562.10), COLONIC POLYPS (ICD-211.3), HEMORRHOIDS (ICD-455.6) - f/u colon due & we will refer to DrStark. ~  colonoscopy 12/05 by DrStark showed divertics & several polyps=  ?path... ~  colonoscopy 7/11 by DrStark showed divertics, hems, & 4mm polyp= tub adenoma... f/u 84yrs.  BACK PAIN, LUMBAR (ICD-724.2) - he takes FLEXERIL 10mg Qhs & TRAMADOL 50mg  Tid for pain... he notes that he has new insurance and [facility] & [facility] are not on the list... he is going to see DrTooke for his opinion... ~  he notes that DrYates wanted to do surg;  DrTooke disagreed.  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 DEPRESSION (ICD-311) - I offered psychiatric referral but he declines...  Health Maintenance:  he gets the seasonal Flu vaccine yearly...   Past Surgical History  Procedure Date  . Lumbar laminectomy [id number]    Outpatient Encounter Prescriptions as of 01/06/2011  Medication Sig Dispense Refill  . ALPRAZolam (XANAX) 0.5 MG tablet Take 0.5 mg by mouth 3 (three) times daily as needed.        Marland Kitchen amLODipine (NORVASC) 5 MG tablet Take 5 mg by mouth daily.        Marland Kitchen aspirin 81 MG tablet Take 81 mg by mouth daily.        . cyclobenzaprine (FLEXERIL) 10 MG tablet Take 10 mg by mouth 3 (three) times daily as needed.        Marland Kitchen losartan (COZAAR) 50 MG tablet Take 50 mg by mouth daily.        Marland Kitchen omeprazole (PRILOSEC) 20 MG capsule Take 20 mg by mouth daily.        . traMADol (ULTRAM) 50 MG tablet Take 50 mg by mouth every 8 (eight) hours as needed.          No Known Allergies   Review of Systems         See HPI - all other systems neg except as noted... The patient complains of decreased hearing and dyspnea on exertion.  The patient denies anorexia, fever, weight loss, weight gain, vision loss, hoarseness, chest pain, syncope, peripheral edema, prolonged cough, headaches, hemoptysis, abdominal pain, melena, hematochezia, severe indigestion/heartburn, hematuria, incontinence, muscle weakness, suspicious skin lesions, transient blindness, difficulty walking, depression, unusual weight change, abnormal bleeding, enlarged lymph nodes, and  angioedema.     Objective:   Physical Exam     WD, Overweight, 60 y/o WM in NAD... GENERAL:  Alert & oriented; he has a depressed mood & sl flat affect... HEENT:  [place]/AT, EOM-wnl, PERRLA, EACs-clear, TMs-wnl, NOSE-clear, THROAT-clear & wnl. NECK:  Supple w/ fairROM; no JVD; normal carotid impulses w/o bruits; no thyromegaly or nodules palpated; no lymphadenopathy. CHEST:  Clear to P & A; without wheezes/ rales/ or rhonchi; he has mult trigger points... HEART:  Regular Rhythm;  gr1/6 SEM, no rubs or gallops heard... ABDOMEN:  Soft & nontender; normal bowel sounds;  no organomegaly or masses detected. EXT: without deformities, mild arthritic changes; no varicose veins/ +venous insuffic/ tr edema. NEURO:  CN's intact;  no focal neuro deficits... DERM:  No lesions noted; no rash etc...   Assessment & Plan:   Hx Asthma & AR>  No problems, w/o recent exac 7 no meds required...  HBP>  Controlled on CCB & ARB, continue same...  LIPIDS>  On diet alone but TG still sl elev, needs better low fat diet...  DM, borderline>  But not really his FBS= 100 today on diet alone...  Obesity>  Wt down 4# to 246# & we discussed diet + exercise...  GI>  GERD, Divertics, Polyps, Hems>  He is up to date & stable...  LBP>  States he is disabled due to LBP, seen by DrYates, Took, et al...  Anxiety/ depression> on alprazolam prn, he has declined Psyche referral in the past..Marland Kitchen

## 2011-01-06 NOTE — Patient Instructions (Signed)
Today we updated your med list in our EPIC system...  Today we did your follow up fasting blood work...    Please call the [facility]REE in a few days for your results...    Dial [id number] & when prompted enter your patient number followed by the # symbol...    Your patient number is:  [id number]#  Keep up the good work w/ your diet & exercise program...    The goal is to lose another 15-20#...  Call for any questions...    Let's plan another follow up visit in about 6 months.Marland KitchenMarland Kitchen

## 2011-01-13 NOTE — Assessment & Plan Note (Signed)
[name] [name]                             PULMONARY OFFICE NOTE   NAME:[name] [name] CHAUCA                      MRN:          [id number]  DATE:06/06/2007                            DOB:          [birth year]    HISTORY OF PRESENT ILLNESS:  The patient is a 60 year old white male  patient of Dr. Kriste [name] who has a known history of asthmatic bronchitis,  hypertension, and sinusitis, and presents today for an acute office  visit.  The patient complains that over the last few days, he has had  sore throat, nasal congestion, and dry cough.  The patient also  complains that he has had increased heartburn symptoms with indigestion  and heartburn.  He complains that he has had some intermittent episodes  of chest tightness as well.  The patient denies any exertional chest  pain, palpitations, orthopnea, PND, presyncopal or syncopal episodes.   PAST MEDICAL HISTORY:  Reviewed.   CURRENT MEDICATIONS:  Reviewed.   PHYSICAL EXAMINATION:  GENERAL:  The patient is a pleasant male in no  acute distress.  VITAL SIGNS:  He is afebrile with stable vital signs.  O2 saturations  96% on room air.  HEENT:  Nasal mucosa is slightly pale, nontender sinuses.  Conjunctivae  benign.  Posterior pharynx is clear.  NECK:  Supple without cervical adenopathy.  No JVD.  LUNGS:  Lung sounds reveal diminished breath sounds at the bases.  HEART:  Regular rate.  ABDOMEN:  Soft and nontender.  EXTREMITIES:  Warm without any edema.   IMPRESSION:  1. Acute rhinitis flare.  The patient is to begin Mucinex DM twice a      day.  Nasal hygiene regimen with saline and Afrin nasal spray x5      days.  2. Gastroesophageal reflux disease.  The patient is to begin Prilosec      20 mg daily along with reflux preventative measures.  The patient      will return back here in two weeks or sooner if needed.  3. Atypical chest pain suspected secondary to reflux and recent viral      upper respiratory  infection.  Out EKG machine was unavailable at      today's visit.  We did do a rhythm strip which showed a normal      sinus rhythm.  I have recommended to the patient that if his chest      tightness returns, he is to contact our office for sooner follow-up      or seek emergency room attention.      [name] Oaks, NP  Electronically Signed      [name] [name]. [name] Basque, MD  Electronically Signed   TP/MedQ  DD: 06/07/2007  DT: 06/08/2007  Job #: [phone] [phone]

## 2011-01-15 ENCOUNTER — Ambulatory Visit: Payer: Self-pay | Admitting: Pulmonary Disease

## 2011-01-16 NOTE — Letter (Signed)
March 10, 2006      [name] [name]  [address]  [place], [place] [address]   RE:  [name] [name], [name] [name]  MRN #[id number]  /  DOB:  1950-11-14   To Whom It May Concern:   [name] [name] is a 60 year old patient of mine followed for  general medical purposes.  He has a history of severe low back pain and  previous lumbar laminectomy and a chronic pain syndrome.  He has seen  multiple physicians including numerous orthopedic surgeons and  rheumatologists.  It is my opinion that [name] [name] should be considered  permanently and totally disabled because of his severe low back pain and  chronic pain syndrome.  He continues to seek relief for his problem from  these physicians and recent referral to neurosurgeons.   Thank you very much for your consideration in this matter.  If I can be of  further help, please feel free to contact my office.    Sincerely,      [name] Cloud. Kriste Basque, MD   SMN/MedQ  DD:  03/10/2006  DT:  03/10/2006  Job #:  [id number]

## 2011-01-22 ENCOUNTER — Telehealth: Payer: Self-pay | Admitting: Pulmonary Disease

## 2011-01-22 NOTE — Telephone Encounter (Signed)
Spoke with pt and he is asking for lab results from 01-06-11 OV. Labs printed. Please advise. Carron Curie, CMA

## 2011-01-22 NOTE — Telephone Encounter (Signed)
PT RETURNED CALL. [name] [name]  °

## 2011-01-22 NOTE — Telephone Encounter (Signed)
Per SN--chems are normal, chol looks good but tg are elevated and needs low fat diet and get weight down.cbc , thyroid and psa are all normal.  lmomtcb for pt

## 2011-01-23 NOTE — Telephone Encounter (Signed)
Spoke with pt and notified of lab results/recs per SN.  Pt verbalized understanding and denied any questions.

## 2011-01-26 ENCOUNTER — Encounter: Payer: Self-pay | Admitting: Pulmonary Disease

## 2011-02-24 ENCOUNTER — Other Ambulatory Visit: Payer: Self-pay | Admitting: Pulmonary Disease

## 2011-05-14 ENCOUNTER — Other Ambulatory Visit: Payer: Self-pay | Admitting: Pulmonary Disease

## 2011-05-15 ENCOUNTER — Telehealth: Payer: Self-pay | Admitting: Pulmonary Disease

## 2011-05-15 ENCOUNTER — Ambulatory Visit (INDEPENDENT_AMBULATORY_CARE_PROVIDER_SITE_OTHER): Payer: Medicare PPO

## 2011-05-15 DIAGNOSIS — Z23 Encounter for immunization: Secondary | ICD-10-CM

## 2011-05-15 MED ORDER — ALPRAZOLAM 0.5 MG PO TABS: 0.5000 mg | ORAL_TABLET | Freq: Three times a day (TID) | ORAL | Status: DC | PRN

## 2011-05-15 NOTE — Telephone Encounter (Signed)
rx for the alprazolam has been called into the pharmacy and pt is aware

## 2011-05-15 NOTE — Telephone Encounter (Signed)
Called and spoke with pt and he stated that [facility] will be out of the alprazolam until Monday.  Per SN   Ok to send in to another pharmacy for #20 of alprazolam 0.5mg   With same directions.  [facility] [address] [facility]ts request and pt is aware

## 2011-05-18 DIAGNOSIS — Z23 Encounter for immunization: Secondary | ICD-10-CM

## 2011-05-29 LAB — DIFFERENTIAL
Eosinophils Absolute: 0.1
Lymphocytes Relative: 22
Lymphs Abs: 1.5
Monocytes Relative: 5
Neutrophils Relative %: 72

## 2011-05-29 LAB — POCT I-STAT, CHEM 8
BUN: 14
Chloride: 106
Creatinine, Ser: 1.1
Potassium: 4.2
Sodium: 141
TCO2: 26

## 2011-05-29 LAB — URINALYSIS, ROUTINE W REFLEX MICROSCOPIC
Glucose, UA: NEGATIVE
Hgb urine dipstick: NEGATIVE
Specific Gravity, Urine: 1.018
pH: 5.5

## 2011-05-29 LAB — URINE CULTURE: Colony Count: 3000

## 2011-05-29 LAB — CBC
HCT: 52.1 — ABNORMAL HIGH
MCV: 90.7
Platelets: 231
RDW: 13.2

## 2011-05-29 LAB — OCCULT BLOOD X 1 CARD TO LAB, STOOL: Fecal Occult Bld: NEGATIVE

## 2011-06-23 ENCOUNTER — Encounter: Payer: Self-pay | Admitting: Adult Health

## 2011-06-23 ENCOUNTER — Ambulatory Visit (INDEPENDENT_AMBULATORY_CARE_PROVIDER_SITE_OTHER): Payer: Medicare PPO | Admitting: Adult Health

## 2011-06-23 DIAGNOSIS — J069 Acute upper respiratory infection, unspecified: Secondary | ICD-10-CM

## 2011-06-23 MED ORDER — CEFDINIR 300 MG PO CAPS: 300.0000 mg | ORAL_CAPSULE | Freq: Two times a day (BID) | ORAL | Status: AC

## 2011-06-23 MED ORDER — CIPROFLOXACIN-HYDROCORTISONE 0.2-1 % OT SUSP: 3.0000 [drp] | Freq: Two times a day (BID) | OTIC | Status: AC

## 2011-06-23 NOTE — Patient Instructions (Addendum)
Omnicef 300mg  Twice daily  For 7days Cipro OTic drops 3 drops to left ear Twice daily  For 7 days  Mucinex DM Twice daily  As needed  Cough/congestion  Please contact office for sooner follow up if symptoms do not improve or worsen or seek emergency care

## 2011-06-24 ENCOUNTER — Telehealth: Payer: Self-pay | Admitting: Adult Health

## 2011-06-24 NOTE — Telephone Encounter (Signed)
Received fax for [facility] [address] stating that the cipro hc otic suspension not covered by pt's insurance. No number provided to call for PA so called and spoke with Rosanne Ashing, pharmacist. He states that the best alternative would be the ofloxacin gtts. TP, pls advise if this is okay to call in, thanks!

## 2011-06-25 MED ORDER — OFLOXACIN 0.3 % OT SOLN: 10.0000 [drp] | Freq: Every day | OTIC | Status: AC

## 2011-06-25 NOTE — Telephone Encounter (Signed)
Ofloxacin otic (ear) drops 10 drop to affected ear daily for 7 days #1 bottle, no refills  follow up if not improving.  Please contact office for sooner follow up if symptoms do not improve or worsen or seek emergency care

## 2011-06-25 NOTE — Telephone Encounter (Signed)
Called and spoke with Marylene Land, pharmacy tech, and informed her of the change in med.  Nothing further needed.

## 2011-06-26 NOTE — Progress Notes (Signed)
Subjective:    Patient ID: [name] [name], male    DOB: November 26, 1950, 60 y.o.   MRN: [id number]  HPI  60 y/o WM here for a follow up visit... he has mult med issues as noted below... he has a hx of a chronic pain syndrome and mult somatic complaints... he is on disability due to LBP & chr pain syndrome- followed by DrYates & Otelia Sergeant, DrBeane & Aplington, DrTruslow... we fill out his [employer] disability paper yearly (in May).  ~  Jan 13, 2010:  he continues to have mult minor somatic complaints, but overall appears stable on current regimen... Losartan was added to his antihypertensive regimen w/ improvement in his home BP checks... he has also lost 20# on diet!!!  He is due for CXR, fasting labs, and f/u colonoscopy (refer to DrStark)... he is quite concerned that his cousin will not drive him here for the colonoscopy & asks that I write a letter indicatng that this is important for him...  ~  July 15, 2010:  he's had a good 148mo- no new complaints or concerns today... he had colonoscopy 7/11 by DrStark w/ divertics, hems, & 4mm polyp removed= tubular adenoma & f/u planned 5yrs... we completed his disability papers for him 6/11- disabled due to LBP & anxiety disorder... his breathing is stable;  BP controlled on meds;  Chol looks good on diet alone, but he needs to get his wt down & we reviewed diet recs & exercise perscription;  he remains quite anxious & has Alpraz for Prn use- again offered psychiatric consult but he declines...  ~  Jan 06, 2011:  148mo ROV & he continues w/ mult somatic complaints> but overall stable:  BP controlled on meds; Chol & BS look good on today's fasting labs; weight is trending down slowly; he continues on tramadol, Flexeril, Alprazolam for his discomfort...  ~06/23/11 Acute OV  Pt complains of SOB and wheezing with exertion for past 2 weeks with dry cough and drainage. Has had fever and chills started 3 days ago. Has a lot of head , ear and throat congestion with decreased  hearing in left ear and pain in left ear. Has a lot of ear pressure on left. Feels very stopped up. OTC not helping. No recent travel or abx use.    PMH:   ALLERGIC RHINITIS (ICD-477.9) - hx mild AR and sinusitis in past... prev eval from DrESL.  ASTHMA, MILD (ICD-493.90) - hx mild AB in the past... prev on Advair & Singulair... no recent URI's etc.  HYPERTENSION (ICD-401.9) - controlled on diet, low sodium, ASA 81mg , AMLODIPINE 5mg /d, & LOSARTAN 50mg /d...   ~  CXR 5/11 was clear 7 WNL... ~  EKG 5/11 showed NSR, WNL, NAD...  VENOUS INSUFFICIENCY (ICD-459.81) - only tr edema as noted... REC~ low sodium/ elevate/ TED's...  HYPERLIPIDEMIA (ICD-272.4) - on diet alone... ~  FLP 10/08 showed TChol 153, TG 230, HDL 29, LDL 93... needs better diet, consider fibrate Rx. ~  FLP 10/09 (wt= 250#) showed TChol 133, TG 208, HDL 31, LDL 65... may need fibrate Rx. ~  FLP 11/10 showed TChol 131, TG 184, HDL 29, LDL 65... rec> better low fat diet, get wt down! ~  FLP 5/11 showed TChol 110, TG 174, HDL 28, LDL 47 ~  FLP 5/12 showed TChol 121, TG 169, HDL 29, LDL 59... Needs better low fat diet.  DIABETES MELLITUS, BORDERLINE (ICD-790.29) - on diet alone w/ weight reduction as noted... ~  prev labs w/ BS  as high as 136... no problem since then... ~  labs in 2007 showed BS~ 100, HgA1c=5.1.Marland KitchenMarland Kitchen diet Rx alone, pt encouraged to lose weight... ~  labs 10/08 showed BS=107 ~  labs 10/09 showed BS= 81, A1c= 5.1 ~  labs 11/10 showed BS= 124 ~  labs 5/11 showed BS= 110, A1c= 5.2 ~  Labs 5/12 showed BS= 100  OBESITY (ICD-278.00) - not really on diet or exercise program... states he's walking some and mowing the yard slowly...  ~  weight 5/09 = 269# ~  weight 10/09 = 250# ~  weight 5/10 = 262# ~  weight 11/10 = 273#... we discussed diet + exercise! ~  weight 5/11 = 250#... on diet & walking. ~  weight 11/11 = 250# ~  Weight 5/12 = 246#  GERD (ICD-530.81) - on OMEPRAZOLE 20mg /d... it controls his symptoms  well...  DIVERTICULOSIS OF COLON (ICD-562.10), COLONIC POLYPS (ICD-211.3), HEMORRHOIDS (ICD-455.6) - f/u colon due & we will refer to DrStark. ~  colonoscopy 12/05 by DrStark showed divertics & several polyps= ?path... ~  colonoscopy 7/11 by DrStark showed divertics, hems, & 4mm polyp= tub adenoma... f/u 46yrs.  BACK PAIN, LUMBAR (ICD-724.2) - he takes FLEXERIL 10mg Qhs & TRAMADOL 50mg  Tid for pain... he notes that he has new insurance and [facility] & [facility] are not on the list... he is going to see DrTooke for his opinion... ~  he notes that DrYates wanted to do surg;  DrTooke disagreed.  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 DEPRESSION (ICD-311) - I offered psychiatric referral but he declines...  Health Maintenance:  he gets the seasonal Flu vaccine yearly...   Past Surgical History  Procedure Date  . Lumbar laminectomy [id number]    Outpatient Encounter Prescriptions as of 06/23/2011  Medication Sig Dispense Refill  . ALPRAZolam (XANAX) 0.5 MG tablet Take 1 tablet (0.5 mg total) by mouth 3 (three) times daily as needed.  20 tablet  0  . amLODipine (NORVASC) 5 MG tablet Take 5 mg by mouth daily.        Marland Kitchen aspirin 81 MG tablet Take 81 mg by mouth daily.        . cyclobenzaprine (FLEXERIL) 10 MG tablet Take 10 mg by mouth 3 (three) times daily as needed.        Marland Kitchen losartan (COZAAR) 50 MG tablet Take 50 mg by mouth daily.        Marland Kitchen omeprazole (PRILOSEC) 20 MG capsule TAKE 1 CAPSULE ONE TIME DAILY  90 capsule  3  . traMADol (ULTRAM) 50 MG tablet TAKE 1 TABLET BY MOUTH 3 TIMES A DAY AS NEEDED FORPAIN  270 tablet  3  . cefdinir (OMNICEF) 300 MG capsule Take 1 capsule (300 mg total) by mouth 2 (two) times daily.  14 capsule  0  . ciprofloxacin-hydrocortisone (CIPRO HC) otic suspension Place 3 drops into the right ear 2 (two) times daily.  10 mL  0    No Known Allergies   Review of Systems Constitutional:   No  weight loss, night sweats,  ++  Fevers, chills, fatigue, or  lassitude.  HEENT:   No headaches,  Difficulty swallowing,  Tooth/dental problems, or  Sore throat,                No sneezing, itching,  ++ear ache, nasal congestion, post nasal drip,   CV:  No chest pain,  Orthopnea, PND, swelling in lower extremities, anasarca, dizziness, palpitations, syncope.   GI  No heartburn, indigestion, abdominal  pain, nausea, vomiting, diarrhea, change in bowel habits, loss of appetite, bloody stools.   Resp: No shortness of breath with exertion or at rest.  No excess mucus, no productive cough,  No non-productive cough,  No coughing up of blood.  No change in color of mucus.  No wheezing.  No chest wall deformity  Skin: no rash or lesions.  GU: no dysuria, change in color of urine, no urgency or frequency.  No flank pain, no hematuria   MS:  No joint pain or swelling.  No decreased range of motion.  No back pain.  Psych:  No change in mood or affect. No depression or anxiety.  No memory loss.              Objective:   Physical Exam      WD, Overweight, 60 y/o WM in NAD... GENERAL:  Alert & oriented  HEENT:  [place]/AT, Left EAC swollen/red, TM mild bulging difficulty to visualize, NOSE-clear, THROAT-clear & wnl. NECK:  Supple w/ fairROM; no JVD; normal carotid impulses w/o bruits; no thyromegaly or nodules palpated; no lymphadenopathy. CHEST:  Coarse BS w/o  wheezes/ rales/ or rhonchi; he has mult trigger points... HEART:  Regular Rhythm;  gr1/6 SEM, no rubs or gallops heard... ABDOMEN:  Soft & nontender; normal bowel sounds; no organomegaly or masses detected. EXT: without deformities, mild arthritic changes; no varicose veins/ +venous insuffic/ tr edema. NEURO:     no focal neuro deficits... DERM:  No lesions noted; no rash etc...   Assessment & Plan:

## 2011-06-26 NOTE — Assessment & Plan Note (Signed)
Acute upper respiratory infection with associated left otitis media and a left otitis externa.  Plan:  Omnicef 300mg  Twice daily  For 7days Cipro OTic drops 3 drops to left ear Twice daily  For 7 days  Mucinex DM Twice daily  As needed  Cough/congestion  Please contact office for sooner follow up if symptoms do not improve or worsen or seek emergency care

## 2011-07-03 ENCOUNTER — Telehealth: Payer: Self-pay | Admitting: Pulmonary Disease

## 2011-07-03 NOTE — Telephone Encounter (Signed)
Order has been placed for the ct scan and note attached to send the results to Dr. Haroldine Laws.

## 2011-07-03 NOTE — Telephone Encounter (Signed)
Per TP Okay-make comment note to send result to [place] also

## 2011-07-03 NOTE — Telephone Encounter (Signed)
Duplicate phone note.  i have already spoke with Dr. Barrett Shell office and will place the order for the ct scan

## 2011-07-03 NOTE — Telephone Encounter (Signed)
Called and spoke with gerry at Dr. Allayne Stack office.   She stated that they tried to order this ct scan for the pt but the pts insurance will not allow them to do this since they are not his primary care doctor.  They are requesting that SN order ct scan of temporal bone and mastoid with contrast.    Seen by Dr. Haroldine Laws and need to r/o left mastoiditis and r/o mastoid colesteotoma.  SN please advise. thanks

## 2011-07-08 ENCOUNTER — Ambulatory Visit (HOSPITAL_COMMUNITY)
Admission: RE | Admit: 2011-07-08 | Discharge: 2011-07-08 | Disposition: A | Discharge status: Home / Self Care | Payer: Medicare PPO | Source: Ambulatory Visit | Attending: Pulmonary Disease | Admitting: Pulmonary Disease

## 2011-07-08 DIAGNOSIS — H9209 Otalgia, unspecified ear: Secondary | ICD-10-CM | POA: Insufficient documentation

## 2011-07-08 DIAGNOSIS — R6884 Jaw pain: Secondary | ICD-10-CM | POA: Insufficient documentation

## 2011-07-08 MED ORDER — IOHEXOL 300 MG/ML  SOLN
75.0000 mL | Freq: Once | INTRAMUSCULAR | Status: AC | PRN
  Administered 2011-07-08: 75 mL via INTRAVENOUS

## 2011-07-09 ENCOUNTER — Encounter: Payer: Self-pay | Admitting: Pulmonary Disease

## 2011-07-09 ENCOUNTER — Ambulatory Visit (INDEPENDENT_AMBULATORY_CARE_PROVIDER_SITE_OTHER): Payer: Medicare PPO | Admitting: Pulmonary Disease

## 2011-07-09 DIAGNOSIS — R7309 Other abnormal glucose: Secondary | ICD-10-CM

## 2011-07-09 DIAGNOSIS — K219 Gastro-esophageal reflux disease without esophagitis: Secondary | ICD-10-CM

## 2011-07-09 DIAGNOSIS — H7090 Unspecified mastoiditis, unspecified ear: Secondary | ICD-10-CM

## 2011-07-09 DIAGNOSIS — M545 Low back pain, unspecified: Secondary | ICD-10-CM

## 2011-07-09 DIAGNOSIS — H7092 Unspecified mastoiditis, left ear: Secondary | ICD-10-CM

## 2011-07-09 DIAGNOSIS — E785 Hyperlipidemia, unspecified: Secondary | ICD-10-CM

## 2011-07-09 DIAGNOSIS — F411 Generalized anxiety disorder: Secondary | ICD-10-CM

## 2011-07-09 DIAGNOSIS — F3289 Other specified depressive episodes: Secondary | ICD-10-CM

## 2011-07-09 DIAGNOSIS — I10 Essential (primary) hypertension: Secondary | ICD-10-CM

## 2011-07-09 DIAGNOSIS — H7490 Unspecified disorder of middle ear and mastoid, unspecified ear: Secondary | ICD-10-CM

## 2011-07-09 DIAGNOSIS — F329 Major depressive disorder, single episode, unspecified: Secondary | ICD-10-CM

## 2011-07-09 DIAGNOSIS — E669 Obesity, unspecified: Secondary | ICD-10-CM

## 2011-07-09 NOTE — Progress Notes (Signed)
Subjective:    Patient ID: [name] [name], male    DOB: [birth year], 61 y.o.   MRN: [id number]  HPI 60 y/o WM here for a follow up visit... he has mult med issues as noted below... he has a hx of a chronic pain syndrome and mult somatic complaints... he is on disability due to LBP & chr pain syndrome- followed by DrYates & Otelia Sergeant, DrBeane & Aplington, DrTruslow... we fill out his [employer] disability paper yearly (in May).  ~  Jan 13, 2010:  he continues to have mult minor somatic complaints, but overall appears stable on current regimen... Losartan was added to his antihypertensive regimen w/ improvement in his home BP checks... he has also lost 20# on diet!!!  He is due for CXR, fasting labs, and f/u colonoscopy (refer to DrStark)... he is quite concerned that his cousin will not drive him here for the colonoscopy & asks that I write a letter indicatng that this is important for him...  ~  July 15, 2010:  he's had a good 38mo- no new complaints or concerns today... he had colonoscopy 7/11 by DrStark w/ divertics, hems, & 4mm polyp removed= tubular adenoma & f/u planned 45yrs... we completed his disability papers for him 6/11- disabled due to LBP & anxiety disorder... his breathing is stable;  BP controlled on meds;  Chol looks good on diet alone, but he needs to get his wt down & we reviewed diet recs & exercise perscription;  he remains quite anxious & has Alpraz for Prn use- again offered psychiatric consult but he declines...  ~  Jan 06, 2011:  38mo ROV & he continues w/ mult somatic complaints> but overall stable:  BP controlled on meds; Chol & BS look good on today's fasting labs; weight is trending down slowly; he continues on tramadol, Flexeril, Alprazolam for his discomfort...  ~  July 09, 2011:  38mo ROV> he has had a recent URI treated w/ Omnicef, Mucinex etc & subseq eval by DrCrossley, ENT w/ sinusitis, drainage, poor energy, SOB, & ear symptoms; DrC put tube in left ear & CT Temporal bones  showed opac of left mastoid air cells, middle ear, & ext ear w/ plus part opac on right as well> he has urgent f/u appt w/ ENT today for further eval & Rx...    His breathing is stable- chest clear, no wheezing; BP controlled on meds; Chol ok on diet Rx but he's had elev TGs & working on low fat diet; weight is actually up 5# & he is admonished to get on diet & exercise program; See prob list below>>           Problem List:  ALLERGIC RHINITIS (ICD-477.9) - hx mild AR and sinusitis in past... prev eval from DrESL.  ASTHMA, MILD (ICD-493.90) - hx mild AB in the past... prev on Advair & Singulair... Recent URI w/ mild exac only & improved...  HYPERTENSION (ICD-401.9) - controlled on diet, low sodium, ASA 81mg , AMLODIPINE 5mg /d, & LOSARTAN 50mg /d... BP= 124/84 today & similar at home now... taking meds regularly & tol well- his ROS is generally pos but no signif CP, palpit, dyspnea, edema. ~  CXR 5/11 was clear 7 WNL... ~  EKG 5/11 showed NSR, WNL, NAD...  VENOUS INSUFFICIENCY (ICD-459.81) - only tr edema as noted... REC~ low sodium/ elevate/ TED's...  HYPERLIPIDEMIA (ICD-272.4) - on diet alone... ~  FLP 10/08 showed TChol 153, TG 230, HDL 29, LDL 93... needs better diet, consider fibrate Rx. ~  FLP 10/09 (wt= 250#) showed TChol 133, TG 208, HDL 31, LDL 65... may need fibrate Rx. ~  FLP 11/10 showed TChol 131, TG 184, HDL 29, LDL 65... rec> better low fat diet, get wt down! ~  FLP 5/11 showed TChol 110, TG 174, HDL 28, LDL 47 ~  FLP 5/12 showed TChol 121, TG 169, HDL 29, LDL 59... Needs better low fat diet.  DIABETES MELLITUS, BORDERLINE (ICD-790.29) - on diet alone w/ weight reduction as noted... ~  prev labs w/ BS as high as 136... no problem since then... ~  labs in 2007 showed BS~ 100, HgA1c=5.1.Marland KitchenMarland Kitchen diet Rx alone, pt encouraged to lose weight... ~  labs 10/08 showed BS=107 ~  labs 10/09 showed BS= 81, A1c= 5.1 ~  labs 11/10 showed BS= 124 ~  labs 5/11 showed BS= 110, A1c= 5.2 ~  Labs  5/12 showed BS= 100  OBESITY (ICD-278.00) - not really on diet or exercise program... states he's walking some and mowing the yard slowly...  ~  weight 5/09 = 269# ~  weight 10/09 = 250# ~  weight 5/10 = 262# ~  weight 11/10 = 273#... we discussed diet + exercise! ~  weight 5/11 = 250#... on diet & walking. ~  weight 11/11 = 250# ~  Weight 5/12 = 246# ~  Weight 11/12 = 252#  GERD (ICD-530.81) - on OMEPRAZOLE 20mg /d... it controls his symptoms well...  DIVERTICULOSIS OF COLON (ICD-562.10), COLONIC POLYPS (ICD-211.3), HEMORRHOIDS (ICD-455.6) - f/u colon due & we will refer to DrStark. ~  colonoscopy 12/05 by DrStark showed divertics & several polyps= ?path... ~  colonoscopy 7/11 by DrStark showed divertics, hems, & 4mm polyp= tub adenoma... f/u 15yrs.  BACK PAIN, LUMBAR (ICD-724.2) - he takes FLEXERIL 10mg Qhs & TRAMADOL 50mg  Tid for pain... he notes that he has new insurance and [facility] & [facility] Bull[facility] are not on the list... he is going to see DrTooke for his opinion... ~  he notes that DrYates wanted to do surg;  DrTooke disagreed.  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 DEPRESSION (ICD-311) - I offered psychiatric referral but he declines...  Health Maintenance:  he gets the seasonal Flu vaccine yearly...   Past Surgical History  Procedure Date  . Lumbar laminectomy [id number]    Outpatient Encounter Prescriptions as of 07/09/2011  Medication Sig Dispense Refill  . ALPRAZolam (XANAX) 0.5 MG tablet Take 1 tablet (0.5 mg total) by mouth 3 (three) times daily as needed.  20 tablet  0  . amLODipine (NORVASC) 5 MG tablet Take 5 mg by mouth daily.        Marland Kitchen aspirin 81 MG tablet Take 81 mg by mouth daily.        . cyclobenzaprine (FLEXERIL) 10 MG tablet Take 10 mg by mouth 3 (three) times daily as needed.        Marland Kitchen dextromethorphan-guaiFENesin (MUCINEX DM) 30-600 MG per 12 hr tablet Take 1 tablet by mouth every 12 (twelve) hours.        .  Lactobacillus (ACIDOPHILUS) 100 MG CAPS Take 1 capsule by mouth daily.        Marland Kitchen losartan (COZAAR) 50 MG tablet Take 50 mg by mouth daily.        Marland Kitchen omeprazole (PRILOSEC) 20 MG capsule TAKE 1 CAPSULE ONE TIME DAILY  90 capsule  3  . traMADol (ULTRAM) 50 MG tablet TAKE 1 TABLET BY MOUTH 3 TIMES A DAY AS NEEDED FORPAIN  270 tablet  3  No Known Allergies   Current Medications, Allergies, Past Medical History, Past Surgical History, Family History, and Social History were reviewed in [employer] record.   Review of Systems         See HPI - all other systems neg except as noted... The patient complains of decreased hearing and dyspnea on exertion.  The patient denies anorexia, fever, weight loss, weight gain, vision loss, hoarseness, chest pain, syncope, peripheral edema, prolonged cough, headaches, hemoptysis, abdominal pain, melena, hematochezia, severe indigestion/heartburn, hematuria, incontinence, muscle weakness, suspicious skin lesions, transient blindness, difficulty walking, depression, unusual weight change, abnormal bleeding, enlarged lymph nodes, and angioedema.     Objective:   Physical Exam     WD, Overweight, 60 y/o WM in NAD... GENERAL:  Alert & oriented; he has a depressed mood & sl flat affect... HEENT:  [place]/AT, EOM-wnl, PERRLA, EACs- exudate present, TMs- obscured on left, NOSE- sl congestion, THROAT-clear & wnl. NECK:  Supple w/ fairROM; no JVD; normal carotid impulses w/o bruits; no thyromegaly or nodules palpated; no lymphadenopathy. CHEST:  Clear to P & A; without wheezes/ rales/ or rhonchi; he has mult trigger points... HEART:  Regular Rhythm;  gr1/6 SEM, no rubs or gallops heard... ABDOMEN:  Soft & nontender; normal bowel sounds; no organomegaly or masses detected. EXT: without deformities, mild arthritic changes; no varicose veins/ +venous insuffic/ tr edema. NEURO:  CN's intact;  no focal neuro deficits... DERM:  No lesions noted; no rash  etc...  RADIOLOGY DATA:  Reviewed in the EPIC EMR & discussed w/ the patient...  LABORATORY DATA:  Reviewed in the EPIC EMR & discussed w/ the patient...   Assessment & Plan:   LEFT EAR>> CT w/ opac in mastoid, middle & ext ear;  To DrCrossley ASAP to r/o cholesteotoma & see if surg needed...  Hx Asthma & AR>  No problems, w/o recent exac 7 no meds required...  HBP>  Controlled on CCB & ARB, continue same...  LIPIDS>  On diet alone but TG still sl elev, needs better low fat diet...  DM, borderline>  BS's are ok on diet alone...  Obesity>  Wt up 6# to 252# & we discussed diet + exercise...  GI>  GERD, Divertics, Polyps, Hems>  He is up to date & stable...  LBP>  States he is disabled due to LBP, seen by DrYates, Took, et al...  Anxiety/ depression> on Alprazolam prn, he has declined Psyche referral in the past..Marland Kitchen

## 2011-07-09 NOTE — Patient Instructions (Signed)
Today we updated your med list in our EPIC system...    Continue your current medications the same...  Keep your appt w/ DrCrossley today to follow up on your left ear problem...  Let's plan a routine medical eval in 6 months, but call sooner if needed for problems.Marland KitchenMarland Kitchen

## 2011-07-26 ENCOUNTER — Encounter: Payer: Self-pay | Admitting: Pulmonary Disease

## 2011-09-02 ENCOUNTER — Telehealth: Payer: Self-pay | Admitting: Pulmonary Disease

## 2011-09-02 MED ORDER — ALPRAZOLAM 0.5 MG PO TABS: 0.5000 mg | ORAL_TABLET | Freq: Three times a day (TID) | ORAL | Status: DC | PRN

## 2011-09-02 MED ORDER — TRAMADOL HCL 50 MG PO TABS: ORAL_TABLET | ORAL | Status: DC

## 2011-09-02 NOTE — Telephone Encounter (Signed)
Prescriptions printed so we may fax to [facility] and [facility] notified to cancel current prescriptions on these two medications.  SN has signed and prescriptions faxed.

## 2011-09-02 NOTE — Telephone Encounter (Signed)
Per SN---ok to send in 90 day supply of both meds for this pt.  thanks

## 2011-09-02 NOTE — Telephone Encounter (Signed)
Pt called back. Says to send all his rx's to [facility] [phone]. [place]

## 2011-09-02 NOTE — Telephone Encounter (Signed)
Pls advise if okay to send new rx for Xanax and Tramadol for 90 day supply to [facility]. Pt previously filled these medications at [facility].

## 2011-09-02 NOTE — Telephone Encounter (Signed)
Pt states is changing pharmacies and does not know the information on his new pharmacy. I advised pt to retrieve that and give [place] a call back. Pt states he immediately needs Ultram. I advised pt as soon as he let's [place] know where to send it we will take care of same.

## 2011-09-24 ENCOUNTER — Telehealth: Payer: Self-pay | Admitting: Pulmonary Disease

## 2011-09-24 MED ORDER — LOSARTAN POTASSIUM 50 MG PO TABS: 50.0000 mg | ORAL_TABLET | Freq: Every day | ORAL | Status: DC

## 2011-09-24 MED ORDER — AMLODIPINE BESYLATE 5 MG PO TABS: 5.0000 mg | ORAL_TABLET | Freq: Every day | ORAL | Status: DC

## 2011-09-24 NOTE — Telephone Encounter (Signed)
rx have been sent to the new mail order per pts request.  lmom to make pt aware.

## 2011-11-09 ENCOUNTER — Telehealth: Payer: Self-pay | Admitting: Pulmonary Disease

## 2011-11-09 MED ORDER — OMEPRAZOLE 20 MG PO CPDR: 20.0000 mg | DELAYED_RELEASE_CAPSULE | Freq: Every day | ORAL | Status: DC

## 2011-11-09 NOTE — Telephone Encounter (Signed)
Rx was refilled. ATC pt and he did not answer and no option to leave a msg.

## 2012-01-06 ENCOUNTER — Ambulatory Visit (INDEPENDENT_AMBULATORY_CARE_PROVIDER_SITE_OTHER): Payer: Medicare Other | Admitting: Pulmonary Disease

## 2012-01-06 ENCOUNTER — Other Ambulatory Visit (INDEPENDENT_AMBULATORY_CARE_PROVIDER_SITE_OTHER): Payer: Medicare Other

## 2012-01-06 ENCOUNTER — Encounter: Payer: Self-pay | Admitting: Pulmonary Disease

## 2012-01-06 VITALS — BP 130/82 | HR 60 | Temp 97.0°F | Ht 74.0 in | Wt 256.0 lb

## 2012-01-06 DIAGNOSIS — J309 Allergic rhinitis, unspecified: Secondary | ICD-10-CM

## 2012-01-06 DIAGNOSIS — F411 Generalized anxiety disorder: Secondary | ICD-10-CM

## 2012-01-06 DIAGNOSIS — E785 Hyperlipidemia, unspecified: Secondary | ICD-10-CM

## 2012-01-06 DIAGNOSIS — M545 Low back pain, unspecified: Secondary | ICD-10-CM

## 2012-01-06 DIAGNOSIS — N139 Obstructive and reflux uropathy, unspecified: Secondary | ICD-10-CM

## 2012-01-06 DIAGNOSIS — E669 Obesity, unspecified: Secondary | ICD-10-CM

## 2012-01-06 DIAGNOSIS — K5730 Diverticulosis of large intestine without perforation or abscess without bleeding: Secondary | ICD-10-CM

## 2012-01-06 DIAGNOSIS — I10 Essential (primary) hypertension: Secondary | ICD-10-CM

## 2012-01-06 DIAGNOSIS — K219 Gastro-esophageal reflux disease without esophagitis: Secondary | ICD-10-CM

## 2012-01-06 DIAGNOSIS — D126 Benign neoplasm of colon, unspecified: Secondary | ICD-10-CM

## 2012-01-06 DIAGNOSIS — R7309 Other abnormal glucose: Secondary | ICD-10-CM

## 2012-01-06 DIAGNOSIS — I872 Venous insufficiency (chronic) (peripheral): Secondary | ICD-10-CM

## 2012-01-06 LAB — CBC WITH DIFFERENTIAL/PLATELET
Basophils Absolute: 0 10*3/uL (ref 0.0–0.1)
HCT: 48.2 % (ref 39.0–52.0)
Lymphs Abs: 1.4 10*3/uL (ref 0.7–4.0)
MCV: 90.4 fl (ref 78.0–100.0)
Monocytes Absolute: 0.4 10*3/uL (ref 0.1–1.0)
Neutrophils Relative %: 71.1 % (ref 43.0–77.0)
Platelets: 252 10*3/uL (ref 150.0–400.0)
RDW: 12.5 % (ref 11.5–14.6)

## 2012-01-06 LAB — HEPATIC FUNCTION PANEL
ALT: 20 U/L (ref 0–53)
AST: 23 U/L (ref 0–37)
Albumin: 4 g/dL (ref 3.5–5.2)
Alkaline Phosphatase: 63 U/L (ref 39–117)
Total Bilirubin: 0.6 mg/dL (ref 0.3–1.2)

## 2012-01-06 LAB — BASIC METABOLIC PANEL
BUN: 19 mg/dL (ref 6–23)
Chloride: 107 mEq/L (ref 96–112)
GFR: 71.76 mL/min (ref >60.00)
Glucose, Bld: 117 mg/dL — ABNORMAL HIGH (ref 70–99)
Potassium: 5.3 mEq/L — ABNORMAL HIGH (ref 3.5–5.1)
Sodium: 142 mEq/L (ref 135–145)

## 2012-01-06 LAB — MAGNESIUM: Magnesium: 2.1 mg/dL (ref 1.5–2.5)

## 2012-01-06 LAB — TSH: TSH: 0.48 u[IU]/mL (ref 0.35–5.50)

## 2012-01-06 LAB — LIPID PANEL: HDL: 30 mg/dL — ABNORMAL LOW (ref >39.00)

## 2012-01-06 MED ORDER — TRAMADOL HCL 50 MG PO TABS: ORAL_TABLET | ORAL | Status: DC

## 2012-01-06 MED ORDER — ALPRAZOLAM 0.5 MG PO TABS: 0.5000 mg | ORAL_TABLET | Freq: Three times a day (TID) | ORAL | Status: DC | PRN

## 2012-01-06 NOTE — Patient Instructions (Signed)
Today we updated your med list in our EPIC system...    Continue your current medications the same...  Today we did your folloe up FASTING blood work...    We will call you w/ the results in a few days...  Keep up the good work w/ your diet & exercise program!    The goal is to lose 10-15 lbs...  Call for any questions...  Let's plan a follow up visit in 6 months.Marland KitchenMarland Kitchen

## 2012-01-06 NOTE — Progress Notes (Signed)
Subjective:    Patient ID: [name] [name], male    DOB: 1950/12/02, 61 y.o.   MRN: [id number]  HPI 61 y/o WM here for a follow up visit... he has mult med issues as noted below... he has a hx of a chronic pain syndrome and mult somatic complaints... he is on disability due to LBP & chr pain syndrome- followed by DrYates & Otelia Sergeant, DrBeane & [place], DrTruslow... we fill out his [employer] disability paper yearly (in May).  ~  Jan 13, 2010:  he continues to have mult minor somatic complaints, but overall appears stable on current regimen... Losartan was added to his antihypertensive regimen w/ improvement in his home BP checks... he has also lost 20# on diet!!!  He is due for CXR, fasting labs, and f/u colonoscopy (refer to DrStark)... he is quite concerned that his cousin will not drive him here for the colonoscopy & asks that I write a letter indicatng that this is important for him...  ~  July 15, 2010:  he's had a good 62mo- no new complaints or concerns today... he had colonoscopy 7/11 by DrStark w/ divertics, hems, & 4mm polyp removed= tubular adenoma & f/u planned 10yrs... we completed his disability papers for him 6/11- disabled due to LBP & anxiety disorder... his breathing is stable;  BP controlled on meds;  Chol looks good on diet alone, but he needs to get his wt down & we reviewed diet recs & exercise perscription;  he remains quite anxious & has Alpraz for Prn use- again offered psychiatric consult but he declines...  ~  Jan 06, 2011:  62mo ROV & he continues w/ mult somatic complaints> but overall stable:  BP controlled on meds; Chol & BS look good on today's fasting labs; weight is trending down slowly; he continues on tramadol, Flexeril, Alprazolam for his discomfort...  ~  July 09, 2011:  62mo ROV> he has had a recent URI treated w/ Omnicef, Mucinex etc & subseq eval by DrCrossley, ENT w/ sinusitis, drainage, poor energy, SOB, & ear symptoms; DrC put tube in left ear & CT Temporal bones  showed opac of left mastoid air cells, middle ear, & ext ear w/ plus part opac on right as well> he has urgent f/u appt w/ ENT today for further eval & Rx...    His breathing is stable- chest clear, no wheezing; BP controlled on meds; Chol ok on diet Rx but he's had elev TGs & working on low fat diet; weight is actually up 5# & he is admonished to get on diet & exercise program; See prob list below>>   ~  Jan 06, 2012:  62mo ROV & [name] [name] gives a long rambling hx w/ mult somatic complaints; when last seen he had CT Temporal bones w/ opacification of left mastoid, middle ear, & ext ear; pt followed up w/ DrCrossley 11/12 but we do not have notes & will call for same to be sent to [place] to review...     He needs a TDAP but he wants [place] to put a query into the system first to see when his last shot was given...    We reviewed his problem list, meds, xrays, & labs> see below>> LABS 5/13:  FLP- not at goals on diet alone;  Chems- ok x FBS=117 A1c=5.4;  CBC- wnl;  TSH=0.48;  PSA=0.70         Problem List:  Hx left ear pain/ ?left mastoiditis >> he was seen by DrCrossley 11/12 &  he had [place] order CT scan & pt was to follow up w/ ENT but we don't have notes... ~  CT Temporal bones 11/12 showed opacification of the left mastoid air cells, left middle ear, & external auditory canal- cholesteotoma not excluded  ALLERGIC RHINITIS (ICD-477.9) - hx mild AR and sinusitis in past... prev eval from DrESL.  ASTHMA, MILD (ICD-493.90) - hx mild AB in the past... prev on Advair & Singulair... Recent URI w/ mild exac only & improved...  HYPERTENSION (ICD-401.9) - controlled on diet, low sodium, ASA 81mg , AMLODIPINE 5mg /d, & LOSARTAN 50mg /d... ~  CXR 5/11 was clear & WNL... ~  EKG 5/11 showed NSR, WNL, NAD... ~  11/12:  BP= 124/84 & similar at home now> taking meds regularly & tol well- his ROS is generally pos but no signif CP, palpit, dyspnea, edema. ~  5/13:  BP= 130/82 & he remains largely asymptomatic...  VENOUS  INSUFFICIENCY (ICD-459.81) - only tr edema as noted... REC~ low sodium/ elevate/ TED's...  HYPERLIPIDEMIA (ICD-272.4) - on diet alone... ~  FLP 10/08 showed TChol 153, TG 230, HDL 29, LDL 93... needs better diet, consider fibrate Rx. ~  FLP 10/09 (wt= 250#) showed TChol 133, TG 208, HDL 31, LDL 65... may need fibrate Rx. ~  FLP 11/10 showed TChol 131, TG 184, HDL 29, LDL 65... rec> better low fat diet, get wt down! ~  FLP 5/11 showed TChol 110, TG 174, HDL 28, LDL 47 ~  FLP 5/12 showed TChol 121, TG 169, HDL 29, LDL 59... Needs better low fat diet. ~  FLP 5/13 showed TChol 124, TG 202, HDL 30, LSL 66... We reviewed low fat diet & exercise program...  DIABETES MELLITUS, BORDERLINE (ICD-790.29) - on diet alone w/ weight reduction as noted... ~  prev labs w/ BS as high as 136... no problem since then... ~  labs in 2007 showed BS~ 100, HgA1c=5.1.Marland KitchenMarland Kitchen diet Rx alone, pt encouraged to lose weight... ~  labs 10/08 showed BS=107 ~  labs 10/09 showed BS= 81, A1c= 5.1 ~  labs 11/10 showed BS= 124 ~  labs 5/11 showed BS= 110, A1c= 5.2 ~  Labs 5/12 showed BS= 100 ~  Labs 5/13 showed BS= 117, A1c= 5.4  OBESITY (ICD-278.00) - not really on diet or exercise program... states he's walking some and mowing the yard slowly...  ~  weight 5/09 = 269# ~  weight 10/09 = 250# ~  weight 5/10 = 262# ~  weight 11/10 = 273#... we discussed diet + exercise! ~  weight 5/11 = 250#... on diet & walking. ~  weight 11/11 = 250# ~  Weight 5/12 = 246# ~  Weight 11/12 = 252# ~  Weight 5/13 = 256#  GERD (ICD-530.81) - on OMEPRAZOLE 20mg /d... it controls his symptoms well...  DIVERTICULOSIS OF COLON (ICD-562.10), COLONIC POLYPS (ICD-211.3), HEMORRHOIDS (ICD-455.6) - f/u colon due & we will refer to DrStark. ~  colonoscopy 12/05 by DrStark showed divertics & several polyps= ?path... ~  colonoscopy 7/11 by DrStark showed divertics, hems, & 4mm polyp= tub adenoma... f/u 19yrs.  BACK PAIN, LUMBAR (ICD-724.2) - he takes  FLEXERIL 10mg Qhs & TRAMADOL 50mg  Tid for pain... he notes that he has new insurance and [facility] & Dor[facility] Bullion are not on the list... he is going to see DrTooke for his opinion... ~  he notes that DrYates wanted to do surg;  DrTooke disagreed.  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 DEPRESSION (ICD-311) - I offered psychiatric referral but he declines.Marland KitchenMarland Kitchen  Health Maintenance:  he gets the seasonal Flu vaccine yearly... He is due for a TDAP but wants to wait til next visit...   Past Surgical History  Procedure Date  . Lumbar laminectomy [id number]    Outpatient Encounter Prescriptions as of 01/06/2012  Medication Sig Dispense Refill  . ALPRAZolam (XANAX) 0.5 MG tablet Take 1 tablet (0.5 mg total) by mouth 3 (three) times daily as needed.  270 tablet  1  . amLODipine (NORVASC) 5 MG tablet Take 1 tablet (5 mg total) by mouth daily.  90 tablet  3  . aspirin 81 MG tablet Take 81 mg by mouth daily.        Marland Kitchen losartan (COZAAR) 50 MG tablet Take 1 tablet (50 mg total) by mouth daily.  90 tablet  3  . omeprazole (PRILOSEC) 20 MG capsule Take 1 capsule (20 mg total) by mouth daily.  90 capsule  3  . traMADol (ULTRAM) 50 MG tablet Take 1/2 to 1 tablet three times daily as needed for pain  270 tablet  1  . cyclobenzaprine (FLEXERIL) 10 MG tablet Take 10 mg by mouth 3 (three) times daily as needed.        . Lactobacillus (ACIDOPHILUS) 100 MG CAPS Take 1 capsule by mouth daily.        Marland Kitchen DISCONTD: dextromethorphan-guaiFENesin (MUCINEX DM) 30-600 MG per 12 hr tablet Take 1 tablet by mouth every 12 (twelve) hours.          No Known Allergies   Current Medications, Allergies, Past Medical History, Past Surgical History, Family History, and Social History were reviewed in [employer] record.   Review of Systems         See HPI - all other systems neg except as noted... The patient complains of decreased hearing and dyspnea on exertion.  The  patient denies anorexia, fever, weight loss, weight gain, vision loss, hoarseness, chest pain, syncope, peripheral edema, prolonged cough, headaches, hemoptysis, abdominal pain, melena, hematochezia, severe indigestion/heartburn, hematuria, incontinence, muscle weakness, suspicious skin lesions, transient blindness, difficulty walking, depression, unusual weight change, abnormal bleeding, enlarged lymph nodes, and angioedema.     Objective:   Physical Exam     WD, Overweight, 61 y/o WM in NAD... GENERAL:  Alert & oriented; he has a depressed mood & sl flat affect... HEENT:  [place]/AT, EOM-wnl, PERRLA, EACs- exudate present, TMs- obscured on left, NOSE- sl congestion, THROAT-clear & wnl. NECK:  Supple w/ fairROM; no JVD; normal carotid impulses w/o bruits; no thyromegaly or nodules palpated; no lymphadenopathy. CHEST:  Clear to P & A; without wheezes/ rales/ or rhonchi; he has mult trigger points... HEART:  Regular Rhythm;  gr1/6 SEM, no rubs or gallops heard... ABDOMEN:  Soft & nontender; normal bowel sounds; no organomegaly or masses detected. EXT: without deformities, mild arthritic changes; no varicose veins/ +venous insuffic/ tr edema. NEURO:  CN's intact;  no focal neuro deficits... DERM:  No lesions noted; no rash etc...  RADIOLOGY DATA:  Reviewed in the EPIC EMR & discussed w/ the patient...  LABORATORY DATA:  Reviewed in the EPIC EMR & discussed w/ the patient...   Assessment & Plan:   LEFT EAR>> CT w/ opac in mastoid, middle & ext ear;  Sent to DrCrossley to r/o cholesteotoma & see if surg needed, we do not have his notes & will call for these to review...  Hx Asthma & AR>  No problems, w/o recent exac & no meds required...  HBP>  Controlled on CCB &  ARB, continue same...  LIPIDS>  On diet alone but TG still sl elev, needs better low fat diet...  DM, borderline>  BS's are ok on diet alone...  Obesity>  Wt up to 256# & we discussed diet + exercise...  GI>  GERD, Divertics,  Polyps, Hems>  He is up to date & stable...  LBP>  States he is disabled due to LBP, seen by [name] [name], et al...  Anxiety/ depression> on Alprazolam prn, he has declined Psyche referral in the past...   Patient's Medications  New Prescriptions   No medications on file  Previous Medications   AMLODIPINE (NORVASC) 5 MG TABLET    Take 1 tablet (5 mg total) by mouth daily.   ASPIRIN 81 MG TABLET    Take 81 mg by mouth daily.     CYCLOBENZAPRINE (FLEXERIL) 10 MG TABLET    Take 10 mg by mouth 3 (three) times daily as needed.     LACTOBACILLUS (ACIDOPHILUS) 100 MG CAPS    Take 1 capsule by mouth daily.     LOSARTAN (COZAAR) 50 MG TABLET    Take 1 tablet (50 mg total) by mouth daily.   OMEPRAZOLE (PRILOSEC) 20 MG CAPSULE    Take 1 capsule (20 mg total) by mouth daily.  Modified Medications   Modified Medication Previous Medication   ALPRAZOLAM (XANAX) 0.5 MG TABLET ALPRAZolam (XANAX) 0.5 MG tablet      Take 1 tablet (0.5 mg total) by mouth 3 (three) times daily as needed for anxiety.    Take 1 tablet (0.5 mg total) by mouth 3 (three) times daily as needed.   TRAMADOL (ULTRAM) 50 MG TABLET traMADol (ULTRAM) 50 MG tablet      Take 1/2 to 1 tablet three times daily as needed for pain    Take 1/2 to 1 tablet three times daily as needed for pain  Discontinued Medications   DEXTROMETHORPHAN-GUAIFENESIN (MUCINEX DM) 30-600 MG PER 12 HR TABLET    Take 1 tablet by mouth every 12 (twelve) hours.

## 2012-01-07 ENCOUNTER — Telehealth: Payer: Self-pay | Admitting: Pulmonary Disease

## 2012-01-07 NOTE — Telephone Encounter (Signed)
Spoke with pt. He states that he needs [place] to send in his rxs for tramadol and xanax. We already handed him the rxs though, so I advised that he will need to either mail these to the [facility] his self, or we can fax them for him. He states that he will bring the rxs back to [place] in a couple of days so that we can fax. Nothing further needed per pt.

## 2012-01-09 ENCOUNTER — Encounter: Payer: Self-pay | Admitting: Pulmonary Disease

## 2012-05-27 ENCOUNTER — Ambulatory Visit (INDEPENDENT_AMBULATORY_CARE_PROVIDER_SITE_OTHER): Payer: Medicare Other

## 2012-05-27 DIAGNOSIS — Z23 Encounter for immunization: Secondary | ICD-10-CM

## 2012-05-30 DIAGNOSIS — Z23 Encounter for immunization: Secondary | ICD-10-CM

## 2012-07-08 ENCOUNTER — Encounter: Payer: Self-pay | Admitting: *Deleted

## 2012-07-11 ENCOUNTER — Ambulatory Visit (INDEPENDENT_AMBULATORY_CARE_PROVIDER_SITE_OTHER): Payer: Medicare Other | Admitting: Pulmonary Disease

## 2012-07-11 ENCOUNTER — Encounter: Payer: Self-pay | Admitting: Pulmonary Disease

## 2012-07-11 VITALS — BP 124/88 | HR 72 | Temp 97.1°F | Ht 74.0 in | Wt 260.6 lb

## 2012-07-11 DIAGNOSIS — F411 Generalized anxiety disorder: Secondary | ICD-10-CM

## 2012-07-11 DIAGNOSIS — J309 Allergic rhinitis, unspecified: Secondary | ICD-10-CM

## 2012-07-11 DIAGNOSIS — J45909 Unspecified asthma, uncomplicated: Secondary | ICD-10-CM

## 2012-07-11 DIAGNOSIS — E785 Hyperlipidemia, unspecified: Secondary | ICD-10-CM

## 2012-07-11 DIAGNOSIS — I872 Venous insufficiency (chronic) (peripheral): Secondary | ICD-10-CM

## 2012-07-11 DIAGNOSIS — Z23 Encounter for immunization: Secondary | ICD-10-CM

## 2012-07-11 DIAGNOSIS — M545 Low back pain: Secondary | ICD-10-CM

## 2012-07-11 DIAGNOSIS — K219 Gastro-esophageal reflux disease without esophagitis: Secondary | ICD-10-CM

## 2012-07-11 DIAGNOSIS — I10 Essential (primary) hypertension: Secondary | ICD-10-CM

## 2012-07-11 DIAGNOSIS — R7309 Other abnormal glucose: Secondary | ICD-10-CM

## 2012-07-11 DIAGNOSIS — E669 Obesity, unspecified: Secondary | ICD-10-CM

## 2012-07-11 DIAGNOSIS — D126 Benign neoplasm of colon, unspecified: Secondary | ICD-10-CM

## 2012-07-11 DIAGNOSIS — K5730 Diverticulosis of large intestine without perforation or abscess without bleeding: Secondary | ICD-10-CM

## 2012-07-11 NOTE — Progress Notes (Signed)
Subjective:    Patient ID: [name] [name], male    DOB: 02-Jan-1951, 61 y.o.   MRN: [id number]  HPI 61 y/o WM here for a follow up visit... he has mult med issues as noted below... he has a hx of a chronic pain syndrome and mult somatic complaints... he is on disability due to LBP & chr pain syndrome- followed by DrYates & Otelia Sergeant, DrBeane & [place], DrTruslow... we fill out his [employer] disability paper yearly (in May).  ~  Jan 13, 2010:  he continues to have mult minor somatic complaints, but overall appears stable on current regimen... Losartan was added to his antihypertensive regimen w/ improvement in his home BP checks... he has also lost 20# on diet!!!  He is due for CXR, fasting labs, and f/u colonoscopy (refer to DrStark)... he is quite concerned that his cousin will not drive him here for the colonoscopy & asks that I write a letter indicatng that this is important for him...  ~  July 15, 2010:  he's had a good 43mo- no new complaints or concerns today... he had colonoscopy 7/11 by DrStark w/ divertics, hems, & 4mm polyp removed= tubular adenoma & f/u planned 23yrs... we completed his disability papers for him 6/11- disabled due to LBP & anxiety disorder... his breathing is stable;  BP controlled on meds;  Chol looks good on diet alone, but he needs to get his wt down & we reviewed diet recs & exercise perscription;  he remains quite anxious & has Alpraz for Prn use- again offered psychiatric consult but he declines...  ~  Jan 06, 2011:  43mo ROV & he continues w/ mult somatic complaints> but overall stable:  BP controlled on meds; Chol & BS look good on today's fasting labs; weight is trending down slowly; he continues on tramadol, Flexeril, Alprazolam for his discomfort...  ~  July 09, 2011:  43mo ROV> he has had a recent URI treated w/ Omnicef, Mucinex etc & subseq eval by DrCrossley, ENT w/ sinusitis, drainage, poor energy, SOB, & ear symptoms; DrC put tube in left ear & CT Temporal bones  showed opac of left mastoid air cells, middle ear, & ext ear w/ plus part opac on right as well> he has urgent f/u appt w/ ENT today for further eval & Rx...    His breathing is stable- chest clear, no wheezing; BP controlled on meds; Chol ok on diet Rx but he's had elev TGs & working on low fat diet; weight is actually up 5# & he is admonished to get on diet & exercise program; See prob list below>>   ~  Jan 06, 2012:  43mo ROV & Dennard Nip gives a long rambling hx w/ mult somatic complaints; when last seen he had CT Temporal bones w/ opacification of left mastoid, middle ear, & ext ear; pt followed up w/ DrCrossley 11/12 but we do not have notes & will call for same to be sent to [place] to review...     He needs a TDAP but he wants [place] to put a query into the system first to see when his last shot was given...    We reviewed his problem list, meds, xrays, & labs> see below>> LABS 5/13:  FLP- not at goals on diet alone;  Chems- ok x FBS=117 A1c=5.4;  CBC- wnl;  TSH=0.48;  PSA=0.70  ~  July 11, 2012:  43mo ROV & Dennard Nip has had a good interval- no new complaints or concerns; We reviewed the  following problems for today's visit>>    AR, ENT, AB> followed by DrCrossley & gets his ears lavaged Q52mo; no recent resp problems but mult somatic complaints & hi anxiety...    HBP> on ASA81, Amlod5, Losar50; BP= 124/88 & he denies angina, palpit, syncope, ch in SOB, edema, etc...     VI> he knows to avoid sodium, elev legs, wear support hose, etc...    Hyperlipid> on diet alone; last FLP 5/13 showed TChol 124, TG 202, HDL 30, LDL 66     Borderline DM> on diet alone; last labs 5/13 showed BS= 117, A1c= 5.4    Overweight> wt=261# which is up 5# despite diet efforts and he is not nearly active enough; we reviewed diet, exercise, wt reduction strategies...    GI- GERD, Divertics, Polyp, Hems> on Omep20 & stable; last colon7/11 by DrStark showed divertics & 4mm tubular adenoma removed...    LBP> on Flex10prn & Tramadol50tid;  followed by DrTooke...    Anxiety, Depression> on alpraz prn & he has been offered Psychiatric consult but he continues to decline... We reviewed prob list, meds, xrays and labs> see below for updates >>          Problem List:  Hx left ear pain/ ?left mastoiditis >> he was seen by DrCrossley 11/12 & he had [place] order CT scan & pt was to follow up w/ ENT but we don't have notes... ~  CT Temporal bones 11/12 showed opacification of the left mastoid air cells, left middle ear, & external auditory canal- cholesteotoma not excluded  ALLERGIC RHINITIS (ICD-477.9) - hx mild AR and sinusitis in past... prev eval from DrESL.  ASTHMA, MILD (ICD-493.90) - hx mild AB in the past... prev on Advair & Singulair... Recent URI w/ mild exac only & improved...  HYPERTENSION (ICD-401.9) - controlled on diet, low sodium, ASA 81mg , AMLODIPINE 5mg /d, & LOSARTAN 50mg /d... ~  CXR 5/11 was clear & WNL... ~  EKG 5/11 showed NSR, WNL, NAD... ~  11/12:  BP= 124/84 & similar at home now> taking meds regularly & tol well- his ROS is generally pos but no signif CP, palpit, dyspnea, edema. ~  5/13:  BP= 130/82 & he remains largely asymptomatic... ~  11/13:  on ASA81, Amlod5, Losar50; BP= 124/88 & he denies angina, palpit, syncope, ch in SOB, edema, etc...   VENOUS INSUFFICIENCY (ICD-459.81) - only tr edema as noted... REC~ low sodium/ elevate/ TED's...  HYPERLIPIDEMIA (ICD-272.4) - on diet alone... ~  FLP 10/08 showed TChol 153, TG 230, HDL 29, LDL 93... needs better diet, consider fibrate Rx. ~  FLP 10/09 (wt= 250#) showed TChol 133, TG 208, HDL 31, LDL 65... may need fibrate Rx. ~  FLP 11/10 showed TChol 131, TG 184, HDL 29, LDL 65... rec> better low fat diet, get wt down! ~  FLP 5/11 showed TChol 110, TG 174, HDL 28, LDL 47 ~  FLP 5/12 showed TChol 121, TG 169, HDL 29, LDL 59... Needs better low fat diet. ~  FLP 5/13 showed TChol 124, TG 202, HDL 30, LSL 66... We reviewed low fat diet & exercise program...  DIABETES  MELLITUS, BORDERLINE (ICD-790.29) - on diet alone w/ weight reduction as noted... ~  prev labs w/ BS as high as 136... no problem since then... ~  labs in 2007 showed BS~ 100, HgA1c=5.1.Marland KitchenMarland Kitchen diet Rx alone, pt encouraged to lose weight... ~  labs 10/08 showed BS=107 ~  labs 10/09 showed BS= 81, A1c= 5.1 ~  labs 11/10 showed  BS= 124 ~  labs 5/11 showed BS= 110, A1c= 5.2 ~  Labs 5/12 showed BS= 100 ~  Labs 5/13 showed BS= 117, A1c= 5.4  OBESITY (ICD-278.00) - not really on diet or exercise program... states he's walking some and mowing the yard slowly...  ~  weight 5/09 = 269# ~  weight 10/09 = 250# ~  weight 5/10 = 262# ~  weight 11/10 = 273#... we discussed diet + exercise! ~  weight 5/11 = 250#... on diet & walking. ~  weight 11/11 = 250# ~  Weight 5/12 = 246# ~  Weight 11/12 = 252# ~  Weight 5/13 = 256# ~  Weight 11/13 = 261#  GERD (ICD-530.81) - on OMEPRAZOLE 20mg /d... it controls his symptoms well...  DIVERTICULOSIS OF COLON (ICD-562.10), COLONIC POLYPS (ICD-211.3), HEMORRHOIDS (ICD-455.6) - f/u colon due & we will refer to DrStark. ~  colonoscopy 12/05 by DrStark showed divertics & several polyps= ?path... ~  colonoscopy 7/11 by DrStark showed divertics, hems, & 4mm polyp= tub adenoma... f/u 47yrs.  BACK PAIN, LUMBAR (ICD-724.2) - he takes FLEXERIL 10mg Qhs & TRAMADOL 50mg  Tid for pain... he notes that he has new insurance and [facility] & [facility] Bull[facility] are not on the list... he is going to see DrTooke for his opinion... ~  he notes that DrYates wanted to do surg;  DrTooke disagreed.  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 DEPRESSION (ICD-311) - I offered psychiatric referral but he declines...  Health Maintenance:  he gets the seasonal Flu vaccine yearly... He is due for a TDAP but wants to wait til next visit...   Past Surgical History  Procedure Date  . Lumbar laminectomy [id number]    Outpatient Encounter Prescriptions as of  07/11/2012  Medication Sig Dispense Refill  . ALPRAZolam (XANAX) 0.5 MG tablet Take 1 tablet (0.5 mg total) by mouth 3 (three) times daily as needed for anxiety.  270 tablet  1  . amLODipine (NORVASC) 5 MG tablet Take 1 tablet (5 mg total) by mouth daily.  90 tablet  3  . aspirin 81 MG tablet Take 81 mg by mouth daily.        Marland Kitchen losartan (COZAAR) 50 MG tablet Take 1 tablet (50 mg total) by mouth daily.  90 tablet  3  . omeprazole (PRILOSEC) 20 MG capsule Take 1 capsule (20 mg total) by mouth daily.  90 capsule  3  . traMADol (ULTRAM) 50 MG tablet Take 1/2 to 1 tablet three times daily as needed for pain  270 tablet  1  . cyclobenzaprine (FLEXERIL) 10 MG tablet Take 10 mg by mouth 3 (three) times daily as needed.        . Lactobacillus (ACIDOPHILUS) 100 MG CAPS Take 1 capsule by mouth daily.          No Known Allergies   Current Medications, Allergies, Past Medical History, Past Surgical History, Family History, and Social History were reviewed in [employer] record.   Review of Systems         See HPI - all other systems neg except as noted... The patient complains of decreased hearing and dyspnea on exertion.  The patient denies anorexia, fever, weight loss, weight gain, vision loss, hoarseness, chest pain, syncope, peripheral edema, prolonged cough, headaches, hemoptysis, abdominal pain, melena, hematochezia, severe indigestion/heartburn, hematuria, incontinence, muscle weakness, suspicious skin lesions, transient blindness, difficulty walking, depression, unusual weight change, abnormal bleeding, enlarged lymph nodes, and angioedema.     Objective:   Physical  Exam     WD, Overweight, 61 y/o WM in NAD... GENERAL:  Alert & oriented; he has a depressed mood & sl flat affect... HEENT:  [place]/AT, EOM-wnl, PERRLA, EACs- exudate present, TMs- obscured on left, NOSE- sl congestion, THROAT-clear & wnl. NECK:  Supple w/ fairROM; no JVD; normal carotid impulses w/o bruits; no  thyromegaly or nodules palpated; no lymphadenopathy. CHEST:  Clear to P & A; without wheezes/ rales/ or rhonchi; he has mult trigger points... HEART:  Regular Rhythm;  gr1/6 SEM, no rubs or gallops heard... ABDOMEN:  Soft & nontender; normal bowel sounds; no organomegaly or masses detected. EXT: without deformities, mild arthritic changes; no varicose veins/ +venous insuffic/ tr edema. NEURO:  CN's intact;  no focal neuro deficits... DERM:  No lesions noted; no rash etc...  RADIOLOGY DATA:  Reviewed in the EPIC EMR & discussed w/ the patient...  LABORATORY DATA:  Reviewed in the EPIC EMR & discussed w/ the patient...   Assessment & Plan:   LEFT EAR>> CT w/ opac in mastoid, middle & ext ear;  Sent to DrCrossley to r/o cholesteotoma & see if surg needed, we do not have his notes & will call for these to review...  Hx Asthma & AR>  No problems, w/o recent exac & no meds required...  HBP>  Controlled on CCB & ARB, continue same...  LIPIDS>  On diet alone but TG still sl elev, needs better low fat diet...  DM, borderline>  BS's are ok on diet alone...  Obesity>  Wt up to 256# & we discussed diet + exercise...  GI>  GERD, Divertics, Polyps, Hems>  He is up to date & stable...  LBP>  States he is disabled due to LBP, seen by Benjiman Core, et al...  Anxiety/ depression> on Alprazolam prn, he has declined Psyche referral in the past...   Patient's Medications  New Prescriptions   No medications on file  Previous Medications   AMLODIPINE (NORVASC) 5 MG TABLET    Take 1 tablet (5 mg total) by mouth daily.   ASPIRIN 81 MG TABLET    Take 81 mg by mouth daily.     CYCLOBENZAPRINE (FLEXERIL) 10 MG TABLET    Take 10 mg by mouth 3 (three) times daily as needed.     LACTOBACILLUS (ACIDOPHILUS) 100 MG CAPS    Take 1 capsule by mouth daily.     LOSARTAN (COZAAR) 50 MG TABLET    Take 1 tablet (50 mg total) by mouth daily.   OMEPRAZOLE (PRILOSEC) 20 MG CAPSULE    Take 1 capsule (20 mg total)  by mouth daily.  Modified Medications   Modified Medication Previous Medication   ALPRAZOLAM (XANAX) 0.5 MG TABLET ALPRAZolam (XANAX) 0.5 MG tablet      Take 1 tablet (0.5 mg total) by mouth 3 (three) times daily as needed for anxiety.    Take 1 tablet (0.5 mg total) by mouth 3 (three) times daily as needed for anxiety.   TRAMADOL (ULTRAM) 50 MG TABLET traMADol (ULTRAM) 50 MG tablet      Take 1/2 to 1 tablet three times daily as needed for pain    Take 1/2 to 1 tablet three times daily as needed for pain  Discontinued Medications   No medications on file

## 2012-07-11 NOTE — Patient Instructions (Addendum)
Today we updated your med list in our EPIC system...    Continue your current medications the same...  Today we gave you the combination Tetanus shot called the TDAP- it should be good for 10 yrs...  Let's get on track w/ our diet & exercise program...    The goal is to lose 10-15 lbs...   Call for any questions...  Let's plan a follow up visit in 6 months w/ FASTING blood work at that time.Marland KitchenMarland Kitchen

## 2012-07-26 ENCOUNTER — Telehealth: Payer: Self-pay | Admitting: Pulmonary Disease

## 2012-07-26 MED ORDER — TRAMADOL HCL 50 MG PO TABS: ORAL_TABLET | ORAL | Status: DC

## 2012-07-26 MED ORDER — ALPRAZOLAM 0.5 MG PO TABS: 0.5000 mg | ORAL_TABLET | Freq: Three times a day (TID) | ORAL | Status: DC | PRN

## 2012-07-26 NOTE — Telephone Encounter (Signed)
Per SN---ok to refill.

## 2012-07-26 NOTE — Telephone Encounter (Signed)
Last refilled 01/06/12 #270 x 1 refilled Last ov 07/11/12 Pending ov 01/09/13 Please advise if okay to refill for 90 day supply. thanks

## 2012-07-26 NOTE — Telephone Encounter (Signed)
Please advise if okay to give refill. Thanks.  

## 2012-07-26 NOTE — Telephone Encounter (Signed)
rx for the tramadol has been sent to the pharmacy.  Pt is aware.

## 2012-07-26 NOTE — Telephone Encounter (Signed)
rx called in an patient is aware Nothing further is needed.

## 2012-10-04 ENCOUNTER — Telehealth: Payer: Self-pay | Admitting: Pulmonary Disease

## 2012-10-04 MED ORDER — AMLODIPINE BESYLATE 5 MG PO TABS: 5.0000 mg | ORAL_TABLET | Freq: Every day | ORAL | Status: DC

## 2012-10-04 NOTE — Telephone Encounter (Signed)
Rx has been sent to pt's new pharmacy. He is aware.

## 2012-11-02 ENCOUNTER — Telehealth: Payer: Self-pay | Admitting: Pulmonary Disease

## 2012-11-02 MED ORDER — ALPRAZOLAM 0.5 MG PO TABS: 0.5000 mg | ORAL_TABLET | Freq: Three times a day (TID) | ORAL | Status: AC | PRN

## 2012-11-02 MED ORDER — LOSARTAN POTASSIUM 50 MG PO TABS: 50.0000 mg | ORAL_TABLET | Freq: Every day | ORAL | Status: DC

## 2012-11-02 MED ORDER — OMEPRAZOLE 20 MG PO CPDR: 20.0000 mg | DELAYED_RELEASE_CAPSULE | Freq: Every day | ORAL | Status: DC

## 2012-11-02 MED ORDER — TRAMADOL HCL 50 MG PO TABS: ORAL_TABLET | ORAL | Status: AC

## 2012-11-02 NOTE — Telephone Encounter (Signed)
rx have been printed out and will have SN sign and fax these to the pharmacy.

## 2012-11-03 NOTE — Telephone Encounter (Signed)
SN signed Rx's and they have been faxed to [facility]. Nothing more needed at this time.

## 2013-01-09 ENCOUNTER — Ambulatory Visit (INDEPENDENT_AMBULATORY_CARE_PROVIDER_SITE_OTHER): Payer: Medicare Other | Admitting: Pulmonary Disease

## 2013-01-09 ENCOUNTER — Other Ambulatory Visit (INDEPENDENT_AMBULATORY_CARE_PROVIDER_SITE_OTHER): Payer: Medicare Other

## 2013-01-09 ENCOUNTER — Ambulatory Visit (INDEPENDENT_AMBULATORY_CARE_PROVIDER_SITE_OTHER)
Admission: RE | Admit: 2013-01-09 | Discharge: 2013-01-09 | Disposition: A | Discharge status: Home / Self Care | Payer: Medicare Other | Source: Ambulatory Visit | Attending: Pulmonary Disease | Admitting: Pulmonary Disease

## 2013-01-09 ENCOUNTER — Encounter: Payer: Self-pay | Admitting: Pulmonary Disease

## 2013-01-09 VITALS — BP 128/84 | HR 73 | Temp 97.5°F | Ht 74.0 in | Wt 259.6 lb

## 2013-01-09 DIAGNOSIS — E785 Hyperlipidemia, unspecified: Secondary | ICD-10-CM

## 2013-01-09 DIAGNOSIS — R7309 Other abnormal glucose: Secondary | ICD-10-CM

## 2013-01-09 DIAGNOSIS — I10 Essential (primary) hypertension: Secondary | ICD-10-CM

## 2013-01-09 DIAGNOSIS — K5730 Diverticulosis of large intestine without perforation or abscess without bleeding: Secondary | ICD-10-CM

## 2013-01-09 DIAGNOSIS — F411 Generalized anxiety disorder: Secondary | ICD-10-CM

## 2013-01-09 DIAGNOSIS — E669 Obesity, unspecified: Secondary | ICD-10-CM

## 2013-01-09 DIAGNOSIS — K219 Gastro-esophageal reflux disease without esophagitis: Secondary | ICD-10-CM

## 2013-01-09 DIAGNOSIS — M545 Low back pain: Secondary | ICD-10-CM

## 2013-01-09 DIAGNOSIS — J45909 Unspecified asthma, uncomplicated: Secondary | ICD-10-CM

## 2013-01-09 DIAGNOSIS — N320 Bladder-neck obstruction: Secondary | ICD-10-CM

## 2013-01-09 DIAGNOSIS — I872 Venous insufficiency (chronic) (peripheral): Secondary | ICD-10-CM

## 2013-01-09 DIAGNOSIS — D126 Benign neoplasm of colon, unspecified: Secondary | ICD-10-CM

## 2013-01-09 DIAGNOSIS — J309 Allergic rhinitis, unspecified: Secondary | ICD-10-CM

## 2013-01-09 LAB — LIPID PANEL
Cholesterol: 132 mg/dL (ref 0–200)
HDL: 29.9 mg/dL — ABNORMAL LOW (ref >39.00)
Total CHOL/HDL Ratio: 4
Triglycerides: 205 mg/dL — ABNORMAL HIGH (ref 0.0–149.0)
VLDL: 41 mg/dL — ABNORMAL HIGH (ref 0.0–40.0)

## 2013-01-09 LAB — CBC WITH DIFFERENTIAL/PLATELET
Basophils Relative: 0.6 % (ref 0.0–3.0)
Eosinophils Absolute: 0.2 10*3/uL (ref 0.0–0.7)
MCHC: 35.4 g/dL (ref 30.0–36.0)
MCV: 89.7 fl (ref 78.0–100.0)
Monocytes Absolute: 0.6 10*3/uL (ref 0.1–1.0)
Neutrophils Relative %: 69.1 % (ref 43.0–77.0)
RBC: 5.5 Mil/uL (ref 4.22–5.81)
RDW: 12.9 % (ref 11.5–14.6)

## 2013-01-09 LAB — HEPATIC FUNCTION PANEL
ALT: 28 U/L (ref 0–53)
AST: 24 U/L (ref 0–37)
Alkaline Phosphatase: 70 U/L (ref 39–117)
Bilirubin, Direct: 0.3 mg/dL (ref 0.0–0.3)
Total Bilirubin: 1.2 mg/dL (ref 0.3–1.2)

## 2013-01-09 LAB — BASIC METABOLIC PANEL
BUN: 13 mg/dL (ref 6–23)
Calcium: 9.2 mg/dL (ref 8.4–10.5)
Chloride: 105 mEq/L (ref 96–112)
Creatinine, Ser: 1 mg/dL (ref 0.4–1.5)
GFR: 77.97 mL/min (ref >60.00)

## 2013-01-09 LAB — PSA: PSA: 0.63 ng/mL (ref 0.10–4.00)

## 2013-01-09 NOTE — Progress Notes (Signed)
Subjective:    Patient ID: [name] [name], male    DOB: 01-14-1951, 62 y.o.   MRN: [id number]  HPI 62 y/o WM here for a follow up visit... he has mult med issues as noted below... he has a hx of a chronic pain syndrome and mult somatic complaints... he is on disability due to LBP & chr pain syndrome- followed by DrYates & Otelia Sergeant, DrBeane & [place], DrTruslow... we fill out his [employer] disability paper yearly (in May).  ~  July 09, 2011:  48mo ROV> he has had a recent URI treated w/ Omnicef, Mucinex etc & subseq eval by DrCrossley, ENT w/ sinusitis, drainage, poor energy, SOB, & ear symptoms; DrC put tube in left ear & CT Temporal bones showed opac of left mastoid air cells, middle ear, & ext ear w/ plus part opac on right as well> he has urgent f/u appt w/ ENT today for further eval & Rx...    His breathing is stable- chest clear, no wheezing; BP controlled on meds; Chol ok on diet Rx but he's had elev TGs & working on low fat diet; weight is actually up 5# & he is admonished to get on diet & exercise program; See prob list below>>   ~  Jan 06, 2012:  48mo ROV & [name] [name] gives a long rambling hx w/ mult somatic complaints; when last seen he had CT Temporal bones w/ opacification of left mastoid, middle ear, & ext ear; pt followed up w/ DrCrossley 11/12 but we do not have notes & will call for same to be sent to [place] to review...     He needs a TDAP but he wants [place] to put a query into the system first to see when his last shot was given...    We reviewed his problem list, meds, xrays, & labs> see below>> LABS 5/13:  FLP- not at goals on diet alone;  Chems- ok x FBS=117 A1c=5.4;  CBC- wnl;  TSH=0.48;  PSA=0.70  ~  July 11, 2012:  48mo ROV & [name] [name] has had a good interval- no new complaints or concerns; We reviewed the following problems for today's visit>>    AR, ENT, AB> followed by DrCrossley & gets his ears lavaged Q22mo; no recent resp problems but mult somatic complaints & hi anxiety...    HBP> on  ASA81, Amlod5, Losar50; BP= 124/88 & he denies angina, palpit, syncope, ch in SOB, edema, etc...     VI> he knows to avoid sodium, elev legs, wear support hose, etc...    Hyperlipid> on diet alone; last FLP 5/13 showed TChol 124, TG 202, HDL 30, LDL 66     Borderline DM> on diet alone; last labs 5/13 showed BS= 117, A1c= 5.4    Overweight> wt=261# which is up 5# despite diet efforts and he is not nearly active enough; we reviewed diet, exercise, wt reduction strategies...    GI- GERD, Divertics, Polyp, Hems> on Omep20 & stable; last colon7/11 by DrStark showed divertics & 4mm tubular adenoma removed...    LBP> on Flex10prn & Tramadol50tid; followed by DrTooke...    Anxiety, Depression> on alpraz prn & he has been offered Psychiatric consult but he continues to decline... We reviewed prob list, meds, xrays and labs> see below for updates >>   ~  Jan 09, 2013:  48mo ROV & [name] [name] is c/o "feeling puny w/ the weather & stuff in the air"; also notes LBP, hip pain, knee pain yet he says he mows weekly &  walks some- encouraged to f/u w/ Ortho vs sports med...  We reviewed the following medical problems during today's office visit >>     AR, ENT, AB> followed by DrCrossley & gets his ears lavaged Q62mo; no recent resp problems but mult somatic complaints & hi anxiety...    HBP> on ASA81, Amlod5, Losar50; BP= 128/84 & he denies angina, palpit, syncope, ch in SOB, edema, etc...     VI> he knows to avoid sodium, elev legs, wear support hose, etc...    Hyperlipid> on diet alone; FLP 5/14 shows TChol 132, TG 205, HDL 30, LDL 71... Needs better low fat diet & wt reduction.     Borderline DM> on diet alone; Labs 5/14 showed BS= 121, & we reviewed low carb, get wt down...    Overweight> wt=260# which is unchanged despite diet efforts and he is not nearly active enough; we reviewed diet, exercise, wt reduction strategies...    GI- GERD, Divertics, Polyp, Hems> on Omep20 & stable; last colon 7/11 by DrStark showed  divertics & 4mm tubular adenoma removed...    LBP> on Flex10prn & Tramadol50tid; followed by DrTooke...    Anxiety, Depression> on Alpraz0.5 prn & he has been offered Psychiatric consult but he continues to decline; mult somatic complaints & pressure of speech...    Derm> he has seborrhea but shampoos make his scalp sensitive, encouraged to f/u w/ Derm... We reviewed prob list, meds, xrays and labs> see below for updates >>  CXR 5/14 showed norm heart size, clear lungs, NAD... LABS 5/14:  FLP- ok x TG=205 HDL=30;  Chems- ok x BS=121;  CBC- ok w/ Hg=17.5;  TSH=0.53;  PSA=0.63...          Problem List:  Hx left ear pain/ ?left mastoiditis >> he was seen by DrCrossley 11/12 & he had [place] order CT scan & pt was to follow up w/ ENT but we don't have notes... ~  CT Temporal bones 11/12 showed opacification of the left mastoid air cells, left middle ear, & external auditory canal- cholesteotoma not excluded  ALLERGIC RHINITIS (ICD-477.9) - hx mild AR and sinusitis in past... prev eval from DrESL.  ASTHMA, MILD (ICD-493.90) - hx mild AB in the past... prev on Advair & Singulair... Recent URI w/ mild exac only & improved...  HYPERTENSION (ICD-401.9) - controlled on diet, low sodium, ASA 81mg , AMLODIPINE 5mg /d, & LOSARTAN 50mg /d... ~  CXR 5/11 was clear & WNL... ~  EKG 5/11 showed NSR, WNL, NAD... ~  11/12:  BP= 124/84 & similar at home now> taking meds regularly & tol well- his ROS is generally pos but no signif CP, palpit, dyspnea, edema. ~  5/13:  BP= 130/82 & he remains largely asymptomatic... ~  11/13: on ASA81, Amlod5, Losar50; BP= 124/88 & he denies angina, palpit, syncope, ch in SOB, edema, etc...  ~  5/14: on ASA81, Amlod5, Losar50; BP= 128/84 & he has mult somatic complaints...  VENOUS INSUFFICIENCY (ICD-459.81) - only tr edema as noted... REC~ low sodium/ elevate/ TED's...  HYPERLIPIDEMIA (ICD-272.4) - on diet alone... ~  FLP 10/08 showed TChol 153, TG 230, HDL 29, LDL 93... needs better  diet, consider fibrate Rx. ~  FLP 10/09 (wt= 250#) showed TChol 133, TG 208, HDL 31, LDL 65... may need fibrate Rx. ~  FLP 11/10 showed TChol 131, TG 184, HDL 29, LDL 65... rec> better low fat diet, get wt down! ~  FLP 5/11 showed TChol 110, TG 174, HDL 28, LDL 47 ~  FLP  5/12 showed TChol 121, TG 169, HDL 29, LDL 59... Needs better low fat diet. ~  FLP 5/13 showed TChol 124, TG 202, HDL 30, LSL 66... We reviewed low fat diet & exercise program... ~  FLP 5/14 showed TChol 132, TG 205, HDL 30, LDL 71... Needs better low fat diet & wt reduction.  DIABETES MELLITUS, BORDERLINE (ICD-790.29) - on diet alone w/ weight reduction as noted... ~  prev labs w/ BS as high as 136... no problem since then... ~  labs in 2007 showed BS~ 100, HgA1c=5.1.Marland KitchenMarland Kitchen diet Rx alone, pt encouraged to lose weight... ~  labs 10/08 showed BS=107 ~  labs 10/09 showed BS= 81, A1c= 5.1 ~  labs 11/10 showed BS= 124 ~  labs 5/11 showed BS= 110, A1c= 5.2 ~  Labs 5/12 showed BS= 100 ~  Labs 5/13 showed BS= 117, A1c= 5.4 ~  Labs 5/14 showed BS= 121  OBESITY (ICD-278.00) - not really on diet or exercise program... states he's walking some and mowing the yard slowly...  ~  weight 5/09 = 269# ~  weight 10/09 = 250# ~  weight 5/10 = 262# ~  weight 11/10 = 273#... we discussed diet + exercise! ~  weight 5/11 = 250#... on diet & walking. ~  weight 11/11 = 250# ~  Weight 5/12 = 246# ~  Weight 11/12 = 252# ~  Weight 5/13 = 256# ~  Weight 11/13 = 261# ~  Weight 5/14 = 260#  GERD (ICD-530.81) - on OMEPRAZOLE 20mg /d... it controls his symptoms well...  DIVERTICULOSIS OF COLON (ICD-562.10), COLONIC POLYPS (ICD-211.3), HEMORRHOIDS (ICD-455.6) - f/u colon due & we will refer to DrStark. ~  colonoscopy 12/05 by DrStark showed divertics & several polyps= ?path... ~  colonoscopy 7/11 by DrStark showed divertics, hems, & 4mm polyp= tub adenoma... f/u 49yrs.  BACK PAIN, LUMBAR (ICD-724.2) - he takes FLEXERIL 10mg Qhs & TRAMADOL 50mg  Tid for  pain... he notes that he has new insurance and [facility] & [facility] are not on the list... he is going to see DrTooke for his opinion... ~  he notes that DrYates wanted to do surg;  DrTooke disagreed.  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 DEPRESSION (ICD-311) - I offered psychiatric referral but he declines...  Health Maintenance:  he gets the seasonal Flu vaccine yearly... He is due for a TDAP but wants to wait til next visit...   Past Surgical History  Procedure Laterality Date  . Lumbar laminectomy  [id number]    Outpatient Encounter Prescriptions as of 01/09/2013  Medication Sig Dispense Refill  . ALPRAZolam (XANAX) 0.5 MG tablet Take 1 tablet (0.5 mg total) by mouth 3 (three) times daily as needed for anxiety.  270 tablet  2  . amLODipine (NORVASC) 5 MG tablet Take 1 tablet (5 mg total) by mouth daily.  90 tablet  3  . aspirin 81 MG tablet Take 81 mg by mouth daily.        Marland Kitchen losartan (COZAAR) 50 MG tablet Take 1 tablet (50 mg total) by mouth daily.  90 tablet  3  . omeprazole (PRILOSEC) 20 MG capsule Take 1 capsule (20 mg total) by mouth daily.  90 capsule  3  . traMADol (ULTRAM) 50 MG tablet Take 1/2 to 1 tablet three times daily as needed for pain  270 tablet  2  . [DISCONTINUED] cyclobenzaprine (FLEXERIL) 10 MG tablet Take 10 mg by mouth 3 (three) times daily as needed.        . [  DISCONTINUED] Lactobacillus (ACIDOPHILUS) 100 MG CAPS Take 1 capsule by mouth daily.         No facility-administered encounter medications on file as of 01/09/2013.    No Known Allergies   Current Medications, Allergies, Past Medical History, Past Surgical History, Family History, and Social History were reviewed in [employer] record.   Review of Systems         See HPI - all other systems neg except as noted... The patient complains of decreased hearing and dyspnea on exertion.  The patient denies anorexia, fever, weight loss,  weight gain, vision loss, hoarseness, chest pain, syncope, peripheral edema, prolonged cough, headaches, hemoptysis, abdominal pain, melena, hematochezia, severe indigestion/heartburn, hematuria, incontinence, muscle weakness, suspicious skin lesions, transient blindness, difficulty walking, depression, unusual weight change, abnormal bleeding, enlarged lymph nodes, and angioedema.     Objective:   Physical Exam     WD, Overweight, 62 y/o WM in NAD... GENERAL:  Alert & oriented; he has a depressed mood & sl flat affect... HEENT:  [place]/AT, EOM-wnl, PERRLA, EACs- exudate present, TMs- obscured on left, NOSE- sl congestion, THROAT-clear & wnl. NECK:  Supple w/ fairROM; no JVD; normal carotid impulses w/o bruits; no thyromegaly or nodules palpated; no lymphadenopathy. CHEST:  Clear to P & A; without wheezes/ rales/ or rhonchi; he has mult trigger points... HEART:  Regular Rhythm;  gr1/6 SEM, no rubs or gallops heard... ABDOMEN:  Soft & nontender; normal bowel sounds; no organomegaly or masses detected. EXT: without deformities, mild arthritic changes; no varicose veins/ +venous insuffic/ tr edema. NEURO:  CN's intact;  no focal neuro deficits... DERM:  No lesions noted; no rash etc...  RADIOLOGY DATA:  Reviewed in the EPIC EMR & discussed w/ the patient...  LABORATORY DATA:  Reviewed in the EPIC EMR & discussed w/ the patient...   Assessment & Plan:    LEFT EAR>> CT w/ opac in mastoid, middle & ext ear;  Sent to DrCrossley to r/o cholesteotoma & see if surg needed, we do not have his notes & will call for these to review...  Hx Asthma & AR>  No problems, w/o recent exac & no meds required...  HBP>  Controlled on CCB & ARB, continue same...  LIPIDS>  On diet alone but TG still sl elev, needs better low fat diet...  DM, borderline>  BS's are ok on diet alone...  Obesity>  Wt is stable at 260# & we discussed diet + exercise...  GI>  GERD, Divertics, Polyps, Hems>  He is up to date &  stable...  LBP>  States he is disabled due to LBP, seen by Benjiman Core, et al...  Anxiety/ depression> on Alprazolam prn, he has declined Psyche referral in the past...   Patient's Medications  New Prescriptions   No medications on file  Previous Medications   ALPRAZOLAM (XANAX) 0.5 MG TABLET    Take 1 tablet (0.5 mg total) by mouth 3 (three) times daily as needed for anxiety.   AMLODIPINE (NORVASC) 5 MG TABLET    Take 1 tablet (5 mg total) by mouth daily.   ASPIRIN 81 MG TABLET    Take 81 mg by mouth daily.     LOSARTAN (COZAAR) 50 MG TABLET    Take 1 tablet (50 mg total) by mouth daily.   OMEPRAZOLE (PRILOSEC) 20 MG CAPSULE    Take 1 capsule (20 mg total) by mouth daily.   TRAMADOL (ULTRAM) 50 MG TABLET    Take 1/2 to 1 tablet  three times daily as needed for pain  Modified Medications   No medications on file  Discontinued Medications   CYCLOBENZAPRINE (FLEXERIL) 10 MG TABLET    Take 10 mg by mouth 3 (three) times daily as needed.     LACTOBACILLUS (ACIDOPHILUS) 100 MG CAPS    Take 1 capsule by mouth daily.

## 2013-01-09 NOTE — Patient Instructions (Addendum)
Today we updated your med list in our EPIC system...    Continue your current medications the same...  Today we did your follow up CXR & FASTING blood work...    We will contact you w/ the results when available...   Let's get on track w/ our diet & exercise program...    The goal is to lose 15-20 lbs...  Call for any questions...  Let's plan a follow up visit in 6mo, sooner if needed for problems...   

## 2013-05-24 ENCOUNTER — Ambulatory Visit (INDEPENDENT_AMBULATORY_CARE_PROVIDER_SITE_OTHER): Payer: Medicare Other

## 2013-05-24 DIAGNOSIS — Z23 Encounter for immunization: Secondary | ICD-10-CM

## 2013-06-19 ENCOUNTER — Telehealth: Payer: Self-pay | Admitting: Pulmonary Disease

## 2013-06-19 NOTE — Telephone Encounter (Signed)
Returning call can be reached [phone].[name] [name]

## 2013-06-19 NOTE — Telephone Encounter (Signed)
Forms have been faxed back [facility] to get the medications refilled for the pt.

## 2013-06-19 NOTE — Telephone Encounter (Signed)
lmomtcb x1 for pt 

## 2013-07-12 ENCOUNTER — Encounter (INDEPENDENT_AMBULATORY_CARE_PROVIDER_SITE_OTHER): Payer: Self-pay

## 2013-07-12 ENCOUNTER — Encounter: Payer: Self-pay | Admitting: Pulmonary Disease

## 2013-07-12 ENCOUNTER — Ambulatory Visit (INDEPENDENT_AMBULATORY_CARE_PROVIDER_SITE_OTHER): Payer: Medicare Other | Admitting: Pulmonary Disease

## 2013-07-12 VITALS — BP 126/82 | HR 75 | Temp 98.0°F | Ht 74.0 in | Wt 253.2 lb

## 2013-07-12 DIAGNOSIS — K219 Gastro-esophageal reflux disease without esophagitis: Secondary | ICD-10-CM

## 2013-07-12 DIAGNOSIS — J309 Allergic rhinitis, unspecified: Secondary | ICD-10-CM

## 2013-07-12 DIAGNOSIS — J45909 Unspecified asthma, uncomplicated: Secondary | ICD-10-CM

## 2013-07-12 DIAGNOSIS — E669 Obesity, unspecified: Secondary | ICD-10-CM

## 2013-07-12 DIAGNOSIS — I872 Venous insufficiency (chronic) (peripheral): Secondary | ICD-10-CM

## 2013-07-12 DIAGNOSIS — E785 Hyperlipidemia, unspecified: Secondary | ICD-10-CM

## 2013-07-12 DIAGNOSIS — K5730 Diverticulosis of large intestine without perforation or abscess without bleeding: Secondary | ICD-10-CM

## 2013-07-12 DIAGNOSIS — F411 Generalized anxiety disorder: Secondary | ICD-10-CM

## 2013-07-12 DIAGNOSIS — I10 Essential (primary) hypertension: Secondary | ICD-10-CM

## 2013-07-12 DIAGNOSIS — M545 Low back pain: Secondary | ICD-10-CM

## 2013-07-12 DIAGNOSIS — D126 Benign neoplasm of colon, unspecified: Secondary | ICD-10-CM

## 2013-07-12 DIAGNOSIS — R7309 Other abnormal glucose: Secondary | ICD-10-CM

## 2013-07-12 NOTE — Patient Instructions (Signed)
Today we updated your med list in our EPIC system...    Continue your current medications the same...  Remember to take a probiotic like ALIGN daily 7 the Activia yogurt as needed...  Call for any questions...  Let's plan a follow up visit in 75mo, sooner if needed for problems.Marland KitchenMarland Kitchen

## 2013-07-12 NOTE — Progress Notes (Signed)
Subjective:    Patient ID: [name] [name], male    DOB: Nov 14, 1950, 62 y.o.   MRN: [id number]  HPI 62 y/o WM here for a follow up visit... he has mult med issues as noted below... he has a hx of a chronic pain syndrome and mult somatic complaints... he is on disability due to LBP & chr pain syndrome- followed by DrYates & Otelia Sergeant, DrBeane & [place], DrTruslow... we fill out his [employer] disability paper yearly (in May).  ~  Jan 06, 2012:  54mo ROV & [name] [name] gives a long rambling hx w/ mult somatic complaints; when last seen he had CT Temporal bones w/ opacification of left mastoid, middle ear, & ext ear; pt followed up w/ DrCrossley 11/12 but we do not have notes & will call for same to be sent to [place] to review...     He needs a TDAP but he wants [place] to put a query into the system first to see when his last shot was given...    We reviewed his problem list, meds, xrays, & labs> see below>> LABS 5/13:  FLP- not at goals on diet alone;  Chems- ok x FBS=117 A1c=5.4;  CBC- wnl;  TSH=0.48;  PSA=0.70  ~  July 11, 2012:  54mo ROV & [name] [name] has had a good interval- no new complaints or concerns; We reviewed the following problems for today's visit>>    AR, ENT, AB> followed by DrCrossley & gets his ears lavaged Q74mo; no recent resp problems but mult somatic complaints & hi anxiety...    HBP> on ASA81, Amlod5, Losar50; BP= 124/88 & he denies angina, palpit, syncope, ch in SOB, edema, etc...     VI> he knows to avoid sodium, elev legs, wear support hose, etc...    Hyperlipid> on diet alone; last FLP 5/13 showed TChol 124, TG 202, HDL 30, LDL 66     Borderline DM> on diet alone; last labs 5/13 showed BS= 117, A1c= 5.4    Overweight> wt=261# which is up 5# despite diet efforts and he is not nearly active enough; we reviewed diet, exercise, wt reduction strategies...    GI- GERD, Divertics, Polyp, Hems> on Omep20 & stable; last colon7/11 by DrStark showed divertics & 4mm tubular adenoma removed...    LBP> on  Flex10prn & Tramadol50tid; followed by DrTooke...    Anxiety, Depression> on alpraz prn & he has been offered Psychiatric consult but he continues to decline... We reviewed prob list, meds, xrays and labs> see below for updates >>   ~  Jan 09, 2013:  54mo ROV & [name] [name] is c/o "feeling puny w/ the weather & stuff in the air"; also notes LBP, hip pain, knee pain yet he says he mows weekly & walks some- encouraged to f/u w/ Ortho vs sports med...  We reviewed the following medical problems during today's office visit >>     AR, ENT, AB> followed by DrCrossley & gets his ears lavaged Q54mo; no recent resp problems but mult somatic complaints & hi anxiety...    HBP> on ASA81, Amlod5, Losar50; BP= 128/84 & he denies angina, palpit, syncope, ch in SOB, edema, etc...     VI> he knows to avoid sodium, elev legs, wear support hose, etc...    Hyperlipid> on diet alone; FLP 5/14 shows TChol 132, TG 205, HDL 30, LDL 71... Needs better low fat diet & wt reduction.     Borderline DM> on diet alone; Labs 5/14 showed BS= 121, & we reviewed low  carb, get wt down...    Overweight> wt=260# which is unchanged despite diet efforts and he is not nearly active enough; we reviewed diet, exercise, wt reduction strategies...    GI- GERD, Divertics, Polyp, Hems> on Omep20 & stable; last colon 7/11 by DrStark showed divertics & 4mm tubular adenoma removed...    LBP> on Flex10prn & Tramadol50tid; followed by DrTooke...    Anxiety, Depression> on Alpraz0.5 prn & he has been offered Psychiatric consult but he continues to decline; mult somatic complaints & pressure of speech...    Derm> he has seborrhea but shampoos make his scalp sensitive, encouraged to f/u w/ Derm... We reviewed prob list, meds, xrays and labs> see below for updates >>  CXR 5/14 showed norm heart size, clear lungs, NAD... LABS 5/14:  FLP- ok x TG=205 HDL=30;  Chems- ok x BS=121;  CBC- ok w/ Hg=17.5;  TSH=0.53;  PSA=0.63...  ~  July 12, 2013:  34mo ROV &  [name] [name] notes "I'm getting older and in more pain", persists w/ mult somatic complaints- eg he thinks he has circ issues because crossing his legs cuts off his circulation; athritis is bothering him, siatic nerve bothers him, constip, etc...  We reviewed the following medical problems during today's office visit >>     AR, ENT, AB> followed by DrCrossley & gets his ears lavaged Q34mo; no recent resp problems but mult somatic complaints & hi anxiety...    HBP> on ASA81, Amlod5, Losar50; BP= 126/82 & he denies angina, palpit, syncope, ch in SOB, edema, etc...     VI> he knows to avoid sodium, elev legs, wear support hose, etc...    Hyperlipid> on diet alone; FLP 5/14 shows TChol 132, TG 205, HDL 30, LDL 71... Needs better low fat diet & wt reduction.     Borderline DM> on diet alone; Labs 5/14 showed BS= 121, & we reviewed low carb, get wt down...    Overweight> wt=253# which is down 7# on diet efforts but he is not nearly active enough; we reviewed diet, exercise, wt reduction strategies...    GI- GERD, Divertics, Polyp, Hems> on Omep20 & stable; last colon 7/11 by DrStark showed divertics & 4mm tubular adenoma removed...    LBP> on Flex10prn & Tramadol50tid; followed by DrTooke...    Anxiety, Depression> on Alpraz0.5 prn & aske to take it more regularly; he has been offered Psychiatric consult but he continues to decline; mult somatic complaints & pressure of speech...    Derm> he has seborrhea but shampoos make his scalp sensitive, encouraged to f/u w/ Derm... We reviewed prob list, meds, xrays and labs> see below for updates >> he had the 2014 Flu vaccine in Sept...          Problem List:  Hx left ear pain/ ?left mastoiditis >> he was seen by DrCrossley 11/12 & he had [place] order CT scan & pt was to follow up w/ ENT but we don't have notes... ~  CT Temporal bones 11/12 showed opacification of the left mastoid air cells, left middle ear, & external auditory canal- cholesteotoma not excluded ~  He  tells me that drCrossley continues to check his ears and lavages wax every 34mo or so...  ALLERGIC RHINITIS (ICD-477.9) - hx mild AR and sinusitis in past... prev eval from DrESL.  ASTHMA, MILD (ICD-493.90) - hx mild AB in the past... prev on Advair & Singulair...  ~  His main trigger has been upper resp infections; none recently & breathing is good.Marland KitchenMarland Kitchen  HYPERTENSION (ICD-401.9) - controlled on diet, low sodium, ASA 81mg , AMLODIPINE 5mg /d, & LOSARTAN 50mg /d... ~  CXR 5/11 was clear & WNL... ~  EKG 5/11 showed NSR, WNL, NAD... ~  11/12:  BP= 124/84 & similar at home now> taking meds regularly & tol well- his ROS is generally pos but no signif CP, palpit, dyspnea, edema. ~  5/13:  BP= 130/82 & he remains largely asymptomatic... ~  11/13: on ASA81, Amlod5, Losar50; BP= 124/88 & he denies angina, palpit, syncope, ch in SOB, edema, etc...  ~  5/14: on ASA81, Amlod5, Losar50; BP= 128/84 & he has mult somatic complaints... ~  11/14: on ASA81, Amlod5, Losar50; BP= 126/82 & he denies angina, palpit, syncope, ch in SOB, edema, etc  VENOUS INSUFFICIENCY (ICD-459.81) - only tr edema as noted... REC~ low sodium/ elevate/ TED's...  HYPERLIPIDEMIA (ICD-272.4) - on diet alone... ~  FLP 10/08 showed TChol 153, TG 230, HDL 29, LDL 93... needs better diet, consider fibrate Rx. ~  FLP 10/09 (wt= 250#) showed TChol 133, TG 208, HDL 31, LDL 65... may need fibrate Rx. ~  FLP 11/10 showed TChol 131, TG 184, HDL 29, LDL 65... rec> better low fat diet, get wt down! ~  FLP 5/11 showed TChol 110, TG 174, HDL 28, LDL 47 ~  FLP 5/12 showed TChol 121, TG 169, HDL 29, LDL 59... Needs better low fat diet. ~  FLP 5/13 showed TChol 124, TG 202, HDL 30, LSL 66... We reviewed low fat diet & exercise program... ~  FLP 5/14 showed TChol 132, TG 205, HDL 30, LDL 71... Needs better low fat diet & wt reduction.  DIABETES MELLITUS, BORDERLINE (ICD-790.29) - on diet alone w/ weight reduction as noted... ~  prev labs w/ BS as high as  136... no problem since then... ~  labs in 2007 showed BS~ 100, HgA1c=5.1.Marland KitchenMarland Kitchen diet Rx alone, pt encouraged to lose weight... ~  labs 10/08 showed BS=107 ~  labs 10/09 showed BS= 81, A1c= 5.1 ~  labs 11/10 showed BS= 124 ~  labs 5/11 showed BS= 110, A1c= 5.2 ~  Labs 5/12 showed BS= 100 ~  Labs 5/13 showed BS= 117, A1c= 5.4 ~  Labs 5/14 showed BS= 121  OBESITY (ICD-278.00) - not really on diet or exercise program... states he's walking some and mowing the yard slowly...  ~  weight 5/09 = 269# ~  weight 10/09 = 250# ~  weight 5/10 = 262# ~  weight 11/10 = 273#... we discussed diet + exercise! ~  weight 5/11 = 250#... on diet & walking. ~  weight 11/11 = 250# ~  Weight 5/12 = 246# ~  Weight 11/12 = 252# ~  Weight 5/13 = 256# ~  Weight 11/13 = 261# ~  Weight 5/14 = 260# ~  Weight 11/14 = 253#... We reviewed diet, exercise, & wt reducing strategies...  GERD (ICD-530.81) - on OMEPRAZOLE 20mg /d... it controls his symptoms well...  DIVERTICULOSIS OF COLON (ICD-562.10), COLONIC POLYPS (ICD-211.3), HEMORRHOIDS (ICD-455.6) - f/u colon due & we will refer to DrStark. ~  colonoscopy 12/05 by DrStark showed divertics & several polyps= ?path... ~  colonoscopy 7/11 by DrStark showed divertics, hems, & 4mm polyp= tub adenoma... f/u 3yrs.  BACK PAIN, LUMBAR (ICD-724.2) - he takes FLEXERIL 10mg Qhs & TRAMADOL 50mg  Tid for pain... he notes that he has new insurance and [facility] & [facility] are not on the list... he is going to see DrTooke for his opinion... ~  he notes that DrYates wanted to  do surg;  DrTooke disagreed.  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 DEPRESSION (ICD-311) - I offered psychiatric referral but he declines...  Health Maintenance:  he gets the seasonal Flu vaccine yearly... He is due for a TDAP but wants to wait til next visit...   Past Surgical History  Procedure Laterality Date  . Lumbar laminectomy  [id number]    Outpatient  Encounter Prescriptions as of 07/12/2013  Medication Sig  . ALPRAZolam (XANAX) 0.5 MG tablet Take 1 tablet (0.5 mg total) by mouth 3 (three) times daily as needed for anxiety.  Marland Kitchen amLODipine (NORVASC) 5 MG tablet Take 1 tablet (5 mg total) by mouth daily.  Marland Kitchen aspirin 81 MG tablet Take 81 mg by mouth daily.    Marland Kitchen losartan (COZAAR) 50 MG tablet Take 1 tablet (50 mg total) by mouth daily.  Marland Kitchen omeprazole (PRILOSEC) 20 MG capsule Take 1 capsule (20 mg total) by mouth daily.  . traMADol (ULTRAM) 50 MG tablet Take 1/2 to 1 tablet three times daily as needed for pain    No Known Allergies   Current Medications, Allergies, Past Medical History, Past Surgical History, Family History, and Social History were reviewed in [employer] record.   Review of Systems         See HPI - all other systems neg except as noted... The patient complains of decreased hearing and dyspnea on exertion.  The patient denies anorexia, fever, weight loss, weight gain, vision loss, hoarseness, chest pain, syncope, peripheral edema, prolonged cough, headaches, hemoptysis, abdominal pain, melena, hematochezia, severe indigestion/heartburn, hematuria, incontinence, muscle weakness, suspicious skin lesions, transient blindness, difficulty walking, depression, unusual weight change, abnormal bleeding, enlarged lymph nodes, and angioedema.     Objective:   Physical Exam     WD, Overweight, 62 y/o WM in NAD... GENERAL:  Alert & oriented; he has a depressed mood & sl flat affect... HEENT:  [place]/AT, EOM-wnl, PERRLA, EACs- exudate present, TMs- obscured on left, NOSE- sl congestion, THROAT-clear & wnl. NECK:  Supple w/ fairROM; no JVD; normal carotid impulses w/o bruits; no thyromegaly or nodules palpated; no lymphadenopathy. CHEST:  Clear to P & A; without wheezes/ rales/ or rhonchi; he has mult trigger points... HEART:  Regular Rhythm;  gr1/6 SEM, no rubs or gallops heard... ABDOMEN:  Soft & nontender; normal  bowel sounds; no organomegaly or masses detected. EXT: without deformities, mild arthritic changes; no varicose veins/ +venous insuffic/ tr edema. NEURO:  CN's intact;  no focal neuro deficits... DERM:  No lesions noted; no rash etc...  RADIOLOGY DATA:  Reviewed in the EPIC EMR & discussed w/ the patient...  LABORATORY DATA:  Reviewed in the EPIC EMR & discussed w/ the patient...   Assessment & Plan:    LEFT EAR>> CT w/ opac in mastoid, middle & ext ear;  Sent to DrCrossley to r/o cholesteotoma & he continues to f/u w/ ENT every 6mo or so...  Hx Asthma & AR>  No problems, w/o recent exac & no meds required...  HBP>  Controlled on CCB & ARB, continue same...  LIPIDS>  On diet alone but TG still sl elev, needs better low fat diet & wt reduction...Marland KitchenMarland Kitchen.  DM, borderline>  BS's are ok on diet alone...  Obesity>  Wt is sl improved at 253# & we discussed diet + exercise...  GI>  GERD, Divertics, Polyps, Hems>  He is up to date & stable...  LBP>  States he is disabled due to LBP, seen by DrYates,  Alveda Reasons, et al...  Anxiety/ depression> on Alprazolam prn, he has declined Psyche referral in the past...   Patient's Medications  New Prescriptions   No medications on file  Previous Medications   ALPRAZOLAM (XANAX) 0.5 MG TABLET    Take 1 tablet (0.5 mg total) by mouth 3 (three) times daily as needed for anxiety.   AMLODIPINE (NORVASC) 5 MG TABLET    Take 1 tablet (5 mg total) by mouth daily.   ASPIRIN 81 MG TABLET    Take 81 mg by mouth daily.     LOSARTAN (COZAAR) 50 MG TABLET    Take 1 tablet (50 mg total) by mouth daily.   OMEPRAZOLE (PRILOSEC) 20 MG CAPSULE    Take 1 capsule (20 mg total) by mouth daily.   TRAMADOL (ULTRAM) 50 MG TABLET    Take 1/2 to 1 tablet three times daily as needed for pain  Modified Medications   No medications on file  Discontinued Medications   No medications on file

## 2013-09-21 ENCOUNTER — Telehealth: Payer: Self-pay | Admitting: Pulmonary Disease

## 2013-09-21 NOTE — Telephone Encounter (Signed)
LMTCB- need the specific name of pharm and let him know SN is retiring

## 2013-09-22 MED ORDER — OMEPRAZOLE 20 MG PO CPDR: 20.0000 mg | DELAYED_RELEASE_CAPSULE | Freq: Every day | ORAL | Status: DC

## 2013-09-22 MED ORDER — AMLODIPINE BESYLATE 5 MG PO TABS: 5.0000 mg | ORAL_TABLET | Freq: Every day | ORAL | Status: DC

## 2013-09-22 MED ORDER — LOSARTAN POTASSIUM 50 MG PO TABS: 50.0000 mg | ORAL_TABLET | Freq: Every day | ORAL | Status: DC

## 2013-09-22 NOTE — Telephone Encounter (Signed)
LMTCBx2. Jennifer Castillo, CMA  

## 2013-09-22 NOTE — Telephone Encounter (Signed)
Spoke with pt and he needed RX's sent [facility]. I advised pt will do so. Also is aware SN is retiring this year. He reports he is going to look in this and see who he wants to see and will let us know. Nothing further needed

## 2014-01-09 ENCOUNTER — Ambulatory Visit: Payer: Medicare Other | Admitting: Pulmonary Disease

## 2014-01-09 ENCOUNTER — Encounter: Payer: Self-pay | Admitting: Internal Medicine

## 2014-01-09 ENCOUNTER — Ambulatory Visit (INDEPENDENT_AMBULATORY_CARE_PROVIDER_SITE_OTHER): Payer: Commercial Managed Care - HMO | Admitting: Internal Medicine

## 2014-01-09 ENCOUNTER — Other Ambulatory Visit (INDEPENDENT_AMBULATORY_CARE_PROVIDER_SITE_OTHER): Payer: Commercial Managed Care - HMO

## 2014-01-09 VITALS — BP 120/80 | HR 59 | Temp 98.1°F | Ht 74.5 in | Wt 245.5 lb

## 2014-01-09 DIAGNOSIS — R7309 Other abnormal glucose: Secondary | ICD-10-CM

## 2014-01-09 DIAGNOSIS — M545 Low back pain, unspecified: Secondary | ICD-10-CM

## 2014-01-09 DIAGNOSIS — Z0000 Encounter for general adult medical examination without abnormal findings: Secondary | ICD-10-CM

## 2014-01-09 DIAGNOSIS — Z0001 Encounter for general adult medical examination with abnormal findings: Secondary | ICD-10-CM | POA: Insufficient documentation

## 2014-01-09 DIAGNOSIS — G8929 Other chronic pain: Secondary | ICD-10-CM | POA: Insufficient documentation

## 2014-01-09 LAB — BASIC METABOLIC PANEL
BUN: 12 mg/dL (ref 6–23)
CO2: 29 mEq/L (ref 19–32)
Calcium: 9.1 mg/dL (ref 8.4–10.5)
Chloride: 104 mEq/L (ref 96–112)
Creatinine, Ser: 1 mg/dL (ref 0.4–1.5)
GFR: 77.71 mL/min (ref >60.00)
Glucose, Bld: 98 mg/dL (ref 70–99)
POTASSIUM: 4 meq/L (ref 3.5–5.1)
SODIUM: 139 meq/L (ref 135–145)

## 2014-01-09 LAB — LIPID PANEL
Cholesterol: 128 mg/dL (ref 0–200)
HDL: 30.2 mg/dL — ABNORMAL LOW (ref >39.00)
LDL CALC: 68 mg/dL (ref 0–99)
Total CHOL/HDL Ratio: 4
Triglycerides: 151 mg/dL — ABNORMAL HIGH (ref 0.0–149.0)
VLDL: 30.2 mg/dL (ref 0.0–40.0)

## 2014-01-09 LAB — URINALYSIS, ROUTINE W REFLEX MICROSCOPIC
BILIRUBIN URINE: NEGATIVE
Hgb urine dipstick: NEGATIVE
Ketones, ur: NEGATIVE
LEUKOCYTES UA: NEGATIVE
Nitrite: NEGATIVE
PH: 5.5 (ref 5.0–8.0)
Specific Gravity, Urine: >=1.03 — AB (ref 1.000–1.030)
TOTAL PROTEIN, URINE-UPE24: NEGATIVE
Urine Glucose: NEGATIVE
Urobilinogen, UA: 0.2 (ref 0.0–1.0)

## 2014-01-09 LAB — CBC WITH DIFFERENTIAL/PLATELET
Basophils Absolute: 0 10*3/uL (ref 0.0–0.1)
Basophils Relative: 0.5 % (ref 0.0–3.0)
EOS PCT: 3.2 % (ref 0.0–5.0)
Eosinophils Absolute: 0.2 10*3/uL (ref 0.0–0.7)
HEMATOCRIT: 47.2 % (ref 39.0–52.0)
Hemoglobin: 16.4 g/dL (ref 13.0–17.0)
LYMPHS ABS: 2.2 10*3/uL (ref 0.7–4.0)
Lymphocytes Relative: 29.2 % (ref 12.0–46.0)
MCHC: 34.6 g/dL (ref 30.0–36.0)
MCV: 91.1 fl (ref 78.0–100.0)
MONO ABS: 0.5 10*3/uL (ref 0.1–1.0)
Monocytes Relative: 6.5 % (ref 3.0–12.0)
Neutro Abs: 4.6 10*3/uL (ref 1.4–7.7)
Neutrophils Relative %: 60.6 % (ref 43.0–77.0)
PLATELETS: 247 10*3/uL (ref 150.0–400.0)
RBC: 5.19 Mil/uL (ref 4.22–5.81)
RDW: 13 % (ref 11.5–15.5)
WBC: 7.6 10*3/uL (ref 4.0–10.5)

## 2014-01-09 LAB — HEPATIC FUNCTION PANEL
ALK PHOS: 57 U/L (ref 39–117)
ALT: 22 U/L (ref 0–53)
AST: 18 U/L (ref 0–37)
Albumin: 4 g/dL (ref 3.5–5.2)
Bilirubin, Direct: 0.2 mg/dL (ref 0.0–0.3)
Total Bilirubin: 1.1 mg/dL (ref 0.2–1.2)
Total Protein: 6.5 g/dL (ref 6.0–8.3)

## 2014-01-09 LAB — PSA: PSA: 0.74 ng/mL (ref 0.10–4.00)

## 2014-01-09 LAB — HEMOGLOBIN A1C: Hgb A1c MFr Bld: 5.5 % (ref 4.6–6.5)

## 2014-01-09 LAB — TSH: TSH: 0.58 u[IU]/mL (ref 0.35–4.50)

## 2014-01-09 MED ORDER — OMEPRAZOLE 20 MG PO CPDR: 20.0000 mg | DELAYED_RELEASE_CAPSULE | Freq: Every day | ORAL | Status: DC

## 2014-01-09 MED ORDER — TRAMADOL HCL 50 MG PO TABS: 50.0000 mg | ORAL_TABLET | Freq: Three times a day (TID) | ORAL | Status: DC | PRN

## 2014-01-09 MED ORDER — AMLODIPINE BESYLATE 5 MG PO TABS: 5.0000 mg | ORAL_TABLET | Freq: Every day | ORAL | Status: DC

## 2014-01-09 MED ORDER — LOSARTAN POTASSIUM 50 MG PO TABS: 50.0000 mg | ORAL_TABLET | Freq: Every day | ORAL | Status: DC

## 2014-01-09 MED ORDER — ALPRAZOLAM 0.5 MG PO TBDP: 0.5000 mg | ORAL_TABLET | Freq: Three times a day (TID) | ORAL | Status: DC | PRN

## 2014-01-09 MED ORDER — ALPRAZOLAM 0.5 MG PO TABS: 0.5000 mg | ORAL_TABLET | Freq: Every evening | ORAL | Status: DC | PRN

## 2014-01-09 NOTE — Progress Notes (Signed)
Pre visit review using our clinic review tool, if applicable. No additional management support is needed unless otherwise documented below in the visit note. 

## 2014-01-09 NOTE — Assessment & Plan Note (Signed)
Cont same tx, refill done

## 2014-01-09 NOTE — Assessment & Plan Note (Signed)
For a1c also, o/w stable

## 2014-01-09 NOTE — Patient Instructions (Signed)

## 2014-01-09 NOTE — Assessment & Plan Note (Signed)

## 2014-01-09 NOTE — Progress Notes (Signed)
Subjective:    Patient ID: [name] [name], male    DOB: [birth year], 63 y.o.   MRN: [id number]  HPI   Here for wellness and f/u;  Overall doing ok;  Pt denies CP, worsening SOB, DOE, wheezing, orthopnea, PND, worsening LE edema, palpitations, dizziness or syncope.  Pt denies neurological change such as new headache, facial or extremity weakness.  Pt denies polydipsia, polyuria, or low sugar symptoms. Pt states overall good compliance with treatment and medications, good tolerability, and has been trying to follow lower cholesterol diet.  Pt denies worsening depressive symptoms, suicidal ideation or panic. No fever, night sweats, wt loss, loss of appetite, or other constitutional symptoms.  Pt states good ability with ADL's, has low fall risk, home safety reviewed and adequate, no other significant changes in hearing or vision, and only occasionally active with exercise.  Pt continues to have recurring LBP without change in severity, bowel or bladder change, fever, wt loss,  worsening LE pain/numbness/weakness, gait change or falls. Hsa hx of mult lumbar surguries, rec'd for other surgury but has declined. Does Ok with tramadol .   Past Medical History  Diagnosis Date  . Allergic rhinitis   . Asthma, mild   . Hypertension   . Venous insufficiency   . Hyperlipidemia   . Borderline diabetes mellitus   . Obesity   . GERD (gastroesophageal reflux disease)   . Diverticulosis of colon   . History of colonic polyps   . Hemorrhoids   . Lumbar back pain   . Generalized anxiety disorder   . Depression    Past Surgical History  Procedure Laterality Date  . Lumbar laminectomy  [id number]    reports that he has never smoked. He has never used smokeless tobacco. He reports that he does not drink alcohol or use illicit drugs. family history is not on file. No Known Allergies Current Outpatient Prescriptions on File Prior to Visit  Medication Sig Dispense Refill  . aspirin 81 MG tablet Take 81 mg by  mouth daily.         No current facility-administered medications on file prior to visit.   Review of Systems Constitutional: Negative for increased diaphoresis, other activity, appetite or other siginficant weight change  HENT: Negative for worsening hearing loss, ear pain, facial swelling, mouth sores and neck stiffness.   Eyes: Negative for other worsening pain, redness or visual disturbance.  Respiratory: Negative for shortness of breath and wheezing.   Cardiovascular: Negative for chest pain and palpitations.  Gastrointestinal: Negative for diarrhea, blood in stool, abdominal distention or other pain Genitourinary: Negative for hematuria, flank pain or change in urine volume.  Musculoskeletal: Negative for myalgias or other joint complaints.  Skin: Negative for color change and wound.  Neurological: Negative for syncope and numbness. other than noted Hematological: Negative for adenopathy. or other swelling Psychiatric/Behavioral: Negative for hallucinations, self-injury, decreased concentration or other worsening agitation.      Objective:   Physical Exam BP 120/80  Pulse 59  Temp(Src) 98.1 F (36.7 C) (Oral)  Ht 6' 2.5" (1.892 m)  Wt 245 lb 8 oz (111.358 kg)  BMI 31.11 kg/m2  SpO2 98% VS noted,  Constitutional: Pt is oriented to person, place, and time. Appears well-developed and well-nourished.  Head: Normocephalic and atraumatic.  Right Ear: External ear normal.  Left Ear: External ear normal.  Nose: Nose normal.  Mouth/Throat: Oropharynx is clear and moist.  Eyes: Conjunctivae and EOM are normal. Pupils are equal, round, and reactive to  light.  Neck: Normal range of motion. Neck supple. No JVD present. No tracheal deviation present.  Cardiovascular: Normal rate, regular rhythm, normal heart sounds and intact distal pulses.   Pulmonary/Chest: Effort normal and breath sounds without rales or wheezing  Abdominal: Soft. Bowel sounds are normal. NT. No HSM    Musculoskeletal: Normal range of motion. Exhibits no edema.  Lymphadenopathy:  Has no cervical adenopathy.  Neurological: Pt is alert and oriented to person, place, and time. Pt has normal reflexes. No cranial nerve deficit. Motor grossly intact Skin: Skin is warm and dry. No rash noted.  Psychiatric:  Has mild nervous mood and affect. Behavior is normal.     Assessment & Plan:

## 2014-02-26 ENCOUNTER — Telehealth: Payer: Self-pay | Admitting: *Deleted

## 2014-02-26 NOTE — Telephone Encounter (Signed)
Left msg on triage stating at ov md renewed all medicines, but he change the alprazolam. He was suppose to take one three times a day. MD change for him to take only one a day. Wanting to know why directions was change. Called pt back inform him md is out of office today will be tomorrow before he address msg....Johny Chess

## 2014-02-27 MED ORDER — ALPRAZOLAM 0.5 MG PO TABS: 0.5000 mg | ORAL_TABLET | Freq: Three times a day (TID) | ORAL | Status: DC | PRN

## 2014-02-27 NOTE — Telephone Encounter (Signed)
This appears to be an error, as the quantity was #270 he should be ok with taking tid prn

## 2014-02-27 NOTE — Telephone Encounter (Signed)
Notified pt with md response. He stated that he didn't get #270 they only dispense #90. He states he will be out in 2 weeks or so would like a updated script for three times a day to take to pharmacy then...Johny Chess

## 2014-02-27 NOTE — Telephone Encounter (Signed)
Done hardcopy to robin  

## 2014-02-28 NOTE — Telephone Encounter (Signed)
Notified pt md corrected rx want [facility] inform pt will send to mail service...[name] [name]

## 2014-03-28 ENCOUNTER — Telehealth: Payer: Self-pay | Admitting: *Deleted

## 2014-03-28 NOTE — Telephone Encounter (Signed)
Left msg on triage wanting to get 90 day supply # 270 on his alprazolam to send [facility]. For 3 months he only has to pay !17.20 which is a lot cheaper than getting monthly at local pharmacy...Johny Chess

## 2014-03-28 NOTE — Telephone Encounter (Signed)
Very sorry, but I would not be able to write for such a large quantity for a controlled substance, as this is not normally done

## 2014-03-29 NOTE — Telephone Encounter (Signed)
Notified pt with md response.../lmb 

## 2014-05-31 ENCOUNTER — Ambulatory Visit (INDEPENDENT_AMBULATORY_CARE_PROVIDER_SITE_OTHER): Payer: Commercial Managed Care - HMO

## 2014-05-31 DIAGNOSIS — Z23 Encounter for immunization: Secondary | ICD-10-CM

## 2014-07-12 ENCOUNTER — Ambulatory Visit (INDEPENDENT_AMBULATORY_CARE_PROVIDER_SITE_OTHER): Payer: Commercial Managed Care - HMO | Admitting: Internal Medicine

## 2014-07-12 ENCOUNTER — Encounter: Payer: Self-pay | Admitting: Internal Medicine

## 2014-07-12 VITALS — BP 112/70 | HR 60 | Temp 97.8°F | Ht 74.5 in | Wt 239.1 lb

## 2014-07-12 DIAGNOSIS — R7303 Prediabetes: Secondary | ICD-10-CM

## 2014-07-12 DIAGNOSIS — Z0000 Encounter for general adult medical examination without abnormal findings: Secondary | ICD-10-CM

## 2014-07-12 DIAGNOSIS — M545 Low back pain: Secondary | ICD-10-CM

## 2014-07-12 DIAGNOSIS — G8929 Other chronic pain: Secondary | ICD-10-CM

## 2014-07-12 DIAGNOSIS — I10 Essential (primary) hypertension: Secondary | ICD-10-CM

## 2014-07-12 DIAGNOSIS — M1000 Idiopathic gout, unspecified site: Secondary | ICD-10-CM

## 2014-07-12 DIAGNOSIS — E785 Hyperlipidemia, unspecified: Secondary | ICD-10-CM

## 2014-07-12 DIAGNOSIS — Z0189 Encounter for other specified special examinations: Secondary | ICD-10-CM

## 2014-07-12 DIAGNOSIS — F411 Generalized anxiety disorder: Secondary | ICD-10-CM

## 2014-07-12 DIAGNOSIS — R7309 Other abnormal glucose: Secondary | ICD-10-CM

## 2014-07-12 MED ORDER — LOSARTAN POTASSIUM 50 MG PO TABS
50.0000 mg | ORAL_TABLET | Freq: Every day | ORAL | Status: DC
Start: 2014-07-12 — End: 2015-01-10

## 2014-07-12 MED ORDER — OMEPRAZOLE 20 MG PO CPDR: 20.0000 mg | DELAYED_RELEASE_CAPSULE | Freq: Every day | ORAL | Status: DC

## 2014-07-12 NOTE — Assessment & Plan Note (Signed)
stable overall by history and exam, recent data reviewed with pt, and pt to continue medical treatment as before,  to f/u any worsening symptoms or concerns BP Readings from Last 3 Encounters:  07/12/14 112/70  01/09/14 120/80  07/12/13 126/82

## 2014-07-12 NOTE — Progress Notes (Signed)
Subjective:    Patient ID: [name] [name], male    DOB: Aug 28, 1951, 63 y.o.   MRN: [id number]  HPI  Here to f/u, Here to f/u; overall doing ok,  Pt denies chest pain, increased sob or doe, wheezing, orthopnea, PND, increased LE swelling, palpitations, dizziness or syncope.  Pt denies polydipsia, polyuria, or low sugar symptoms such as weakness or confusion improved with po intake.  Pt denies new neurological symptoms such as new headache, or facial or extremity weakness or numbness.   Pt states overall good compliance with meds, has been trying to follow lower cholesterol diet, with wt overall stable,  but little exercise however.Has occas occasional constipation in the AM, also Does have several mos ongoing nasal allergy symptoms with clearish congestion, itch and sneezing, without fever, pain, ST, cough, swelling or wheezing.  Used to take allergy shots, but did not seem to help. Used to take atithist and nasal steroid but quit when got too expensive.  Also was on allopuririnol for several years in past , but no recent gout attacks. Pt continues to have recurring LBP without change in severity, bowel or bladder change, fever, wt loss,  worsening LE pain/numbness/weakness, gait change or falls, has had series ESI in past as well, not worse overall recent. Denies worsening depressive symptoms, suicidal ideation, or panic; has ongoing anxiety, not increased recently.  Past Medical History  Diagnosis Date  . Allergic rhinitis   . Asthma, mild   . Hypertension   . Venous insufficiency   . Hyperlipidemia   . Borderline diabetes mellitus   . Obesity   . GERD (gastroesophageal reflux disease)   . Diverticulosis of colon   . History of colonic polyps   . Hemorrhoids   . Lumbar back pain   . Generalized anxiety disorder   . Depression    Past Surgical History  Procedure Laterality Date  . Lumbar laminectomy  [id number]    reports that he has never smoked. He has never used smokeless tobacco. He  reports that he does not drink alcohol or use illicit drugs. family history is not on file. No Known Allergies Current Outpatient Prescriptions on File Prior to Visit  Medication Sig Dispense Refill  . ALPRAZolam (XANAX) 0.5 MG tablet Take 1 tablet (0.5 mg total) by mouth 3 (three) times daily as needed for anxiety or sleep. 90 tablet 2  . amLODipine (NORVASC) 5 MG tablet Take 1 tablet (5 mg total) by mouth daily. 90 tablet 3  . aspirin 81 MG tablet Take 81 mg by mouth daily.      . traMADol (ULTRAM) 50 MG tablet Take 1 tablet (50 mg total) by mouth every 8 (eight) hours as needed. 360 tablet 1   No current facility-administered medications on file prior to visit.   Review of Systems  Constitutional: Negative for unusual diaphoresis or other sweats  HENT: Negative for ringing in ear Eyes: Negative for double vision or worsening visual disturbance.  Respiratory: Negative for choking and stridor.   Gastrointestinal: Negative for vomiting or other signifcant bowel change Genitourinary: Negative for hematuria or decreased urine volume.  Musculoskeletal: Negative for other MSK pain or swelling Skin: Negative for color change and worsening wound.  Neurological: Negative for tremors and numbness other than noted  Psychiatric/Behavioral: Negative for decreased concentration or agitation other than above       Objective:   Physical Exam BP 112/70 mmHg  Pulse 60  Temp(Src) 97.8 F (36.6 C) (Oral)  Ht 6' 2.5" (  1.892 m)  Wt 239 lb 2 oz (108.466 kg)  BMI 30.30 kg/m2  SpO2 95% VS noted,  Constitutional: Pt appears well-developed, well-nourished.  HENT: Head: NCAT.  Right Ear: External ear normal.  Left Ear: External ear normal.  Eyes: . Pupils are equal, round, and reactive to light. Conjunctivae and EOM are normal Neck: Normal range of motion. Neck supple.  Cardiovascular: Normal rate and regular rhythm.   Pulmonary/Chest: Effort normal and breath sounds normal.  Neurological: Pt is  alert. Not confused , motor grossly intact Skin: Skin is warm. No rash Psychiatric: Pt behavior is normal. No agitation. mod nervous- chronic persistent    Assessment & Plan:

## 2014-07-12 NOTE — Assessment & Plan Note (Signed)
stable overall by history and exam, recent data reviewed with pt, and pt to continue medical treatment as before,  to f/u any worsening symptoms or concerns Lab Results  Component Value Date   HGBA1C 5.5 01/09/2014

## 2014-07-12 NOTE — Progress Notes (Signed)
Pre visit review using our clinic review tool, if applicable. No additional management support is needed unless otherwise documented below in the visit note. 

## 2014-07-12 NOTE — Assessment & Plan Note (Signed)
stable overall by history and exam, recent data reviewed with pt, and pt to continue medical treatment as before,  to f/u any worsening symptoms or concerns, for med refill 

## 2014-07-12 NOTE — Assessment & Plan Note (Addendum)
stable overall by history and exam, recent data reviewed with pt, and pt to continue medical treatment as before,  to f/u any worsening symptoms or concerns Lab Results  Component Value Date   WBC 7.6 01/09/2014   HGB 16.4 01/09/2014   HCT 47.2 01/09/2014   PLT 247.0 01/09/2014   GLUCOSE 98 01/09/2014   CHOL 128 01/09/2014   TRIG 151.0* 01/09/2014   HDL 30.20* 01/09/2014   LDLDIRECT 71.0 01/09/2013   LDLCALC 68 01/09/2014   ALT 22 01/09/2014   AST 18 01/09/2014   NA 139 01/09/2014   K 4.0 01/09/2014   CL 104 01/09/2014   CREATININE 1.0 01/09/2014   BUN 12 01/09/2014   CO2 29 01/09/2014   TSH 0.58 01/09/2014   PSA 0.74 01/09/2014   HGBA1C 5.5 01/09/2014   For med refill

## 2014-07-12 NOTE — Patient Instructions (Signed)
Please continue all other medications as before, and refills have been done if requested.  Please have the pharmacy call with any other refills you may need.  Please continue your efforts at being more active, low cholesterol diet, and weight control.  You are otherwise up to date with prevention measures today.  Please keep your appointments with your specialists as you may have planned  No further lab work needed today  Please return in 6 months, or sooner if needed, with Lab testing done 3-5 days before  

## 2014-07-12 NOTE — Assessment & Plan Note (Signed)
stable overall by history and exam, recent data reviewed with pt, and pt to continue medical treatment as before,  to f/u any worsening symptoms or concerns Lab Results  Component Value Date   LDLCALC 68 01/09/2014    

## 2014-07-18 ENCOUNTER — Telehealth: Payer: Self-pay | Admitting: Internal Medicine

## 2014-07-18 NOTE — Telephone Encounter (Signed)
emmi mailed  °

## 2014-07-31 ENCOUNTER — Other Ambulatory Visit: Payer: Self-pay

## 2014-07-31 MED ORDER — ALPRAZOLAM 0.5 MG PO TABS: 0.5000 mg | ORAL_TABLET | Freq: Three times a day (TID) | ORAL | Status: DC | PRN

## 2014-07-31 MED ORDER — TRAMADOL HCL 50 MG PO TABS: 50.0000 mg | ORAL_TABLET | Freq: Three times a day (TID) | ORAL | Status: DC | PRN

## 2014-07-31 NOTE — Telephone Encounter (Signed)
Done hardcopy to robin  

## 2014-07-31 NOTE — Telephone Encounter (Signed)
Faxed hardcopy for [employer] order for Alprazolam and Tramadol

## 2014-09-07 ENCOUNTER — Encounter: Payer: Self-pay | Admitting: Gastroenterology

## 2014-12-21 ENCOUNTER — Other Ambulatory Visit: Payer: Self-pay | Admitting: Internal Medicine

## 2014-12-21 NOTE — Telephone Encounter (Signed)
Done hardcopy to Cherina  

## 2014-12-21 NOTE — Telephone Encounter (Signed)
Faxed script back to [employer].../lmb 

## 2015-01-10 ENCOUNTER — Ambulatory Visit (INDEPENDENT_AMBULATORY_CARE_PROVIDER_SITE_OTHER): Payer: Commercial Managed Care - HMO | Admitting: Internal Medicine

## 2015-01-10 ENCOUNTER — Encounter: Payer: Self-pay | Admitting: Internal Medicine

## 2015-01-10 ENCOUNTER — Other Ambulatory Visit (INDEPENDENT_AMBULATORY_CARE_PROVIDER_SITE_OTHER): Payer: Commercial Managed Care - HMO

## 2015-01-10 VITALS — BP 110/62 | HR 56 | Temp 97.9°F | Wt 232.1 lb

## 2015-01-10 DIAGNOSIS — R7309 Other abnormal glucose: Secondary | ICD-10-CM

## 2015-01-10 DIAGNOSIS — R7303 Prediabetes: Secondary | ICD-10-CM

## 2015-01-10 DIAGNOSIS — Z0000 Encounter for general adult medical examination without abnormal findings: Secondary | ICD-10-CM | POA: Diagnosis not present

## 2015-01-10 LAB — CBC WITH DIFFERENTIAL/PLATELET
BASOS PCT: 0.3 % (ref 0.0–3.0)
Basophils Absolute: 0 10*3/uL (ref 0.0–0.1)
Eosinophils Absolute: 0.2 10*3/uL (ref 0.0–0.7)
Eosinophils Relative: 2.6 % (ref 0.0–5.0)
HCT: 48.8 % (ref 39.0–52.0)
Hemoglobin: 16.8 g/dL (ref 13.0–17.0)
LYMPHS ABS: 1.6 10*3/uL (ref 0.7–4.0)
LYMPHS PCT: 21.8 % (ref 12.0–46.0)
MCHC: 34.5 g/dL (ref 30.0–36.0)
MCV: 89.1 fl (ref 78.0–100.0)
MONO ABS: 0.4 10*3/uL (ref 0.1–1.0)
Monocytes Relative: 5.7 % (ref 3.0–12.0)
NEUTROS ABS: 5.2 10*3/uL (ref 1.4–7.7)
Neutrophils Relative %: 69.6 % (ref 43.0–77.0)
Platelets: 263 10*3/uL (ref 150.0–400.0)
RBC: 5.48 Mil/uL (ref 4.22–5.81)
RDW: 13.1 % (ref 11.5–15.5)
WBC: 7.5 10*3/uL (ref 4.0–10.5)

## 2015-01-10 LAB — URINALYSIS, ROUTINE W REFLEX MICROSCOPIC
BILIRUBIN URINE: NEGATIVE
KETONES UR: NEGATIVE
Leukocytes, UA: NEGATIVE
Nitrite: NEGATIVE
TOTAL PROTEIN, URINE-UPE24: NEGATIVE
URINE GLUCOSE: NEGATIVE
Urobilinogen, UA: 0.2 (ref 0.0–1.0)
pH: 5.5 (ref 5.0–8.0)

## 2015-01-10 LAB — LIPID PANEL
CHOL/HDL RATIO: 4
Cholesterol: 115 mg/dL (ref 0–200)
HDL: 30.2 mg/dL — AB (ref >39.00)
LDL Cholesterol: 53 mg/dL (ref 0–99)
NonHDL: 84.8
TRIGLYCERIDES: 160 mg/dL — AB (ref 0.0–149.0)
VLDL: 32 mg/dL (ref 0.0–40.0)

## 2015-01-10 LAB — BASIC METABOLIC PANEL
BUN: 15 mg/dL (ref 6–23)
CO2: 31 meq/L (ref 19–32)
Calcium: 9.8 mg/dL (ref 8.4–10.5)
Chloride: 105 mEq/L (ref 96–112)
Creatinine, Ser: 1.09 mg/dL (ref 0.40–1.50)
GFR: 72.56 mL/min (ref >60.00)
GLUCOSE: 122 mg/dL — AB (ref 70–99)
POTASSIUM: 4.8 meq/L (ref 3.5–5.1)
SODIUM: 142 meq/L (ref 135–145)

## 2015-01-10 LAB — HEPATIC FUNCTION PANEL
ALBUMIN: 4.2 g/dL (ref 3.5–5.2)
ALK PHOS: 66 U/L (ref 39–117)
ALT: 14 U/L (ref 0–53)
AST: 15 U/L (ref 0–37)
BILIRUBIN DIRECT: 0.2 mg/dL (ref 0.0–0.3)
TOTAL PROTEIN: 6.6 g/dL (ref 6.0–8.3)
Total Bilirubin: 0.9 mg/dL (ref 0.2–1.2)

## 2015-01-10 LAB — PSA: PSA: 0.8 ng/mL (ref 0.10–4.00)

## 2015-01-10 LAB — HEMOGLOBIN A1C: Hgb A1c MFr Bld: 5.2 % (ref 4.6–6.5)

## 2015-01-10 LAB — TSH: TSH: 0.37 u[IU]/mL (ref 0.35–4.50)

## 2015-01-10 MED ORDER — OMEPRAZOLE 20 MG PO CPDR: 20.0000 mg | DELAYED_RELEASE_CAPSULE | Freq: Every day | ORAL | Status: DC

## 2015-01-10 MED ORDER — LOSARTAN POTASSIUM 50 MG PO TABS: 50.0000 mg | ORAL_TABLET | Freq: Every day | ORAL | Status: DC

## 2015-01-10 MED ORDER — AMLODIPINE BESYLATE 5 MG PO TABS: 5.0000 mg | ORAL_TABLET | Freq: Every day | ORAL | Status: DC

## 2015-01-10 NOTE — Patient Instructions (Signed)

## 2015-01-10 NOTE — Progress Notes (Signed)
Subjective:    Patient ID: [name] [name], male    DOB: July 27, 1951, 64 y.o.   MRN: [id number]  HPI  Here for wellness and f/u;  Overall doing ok;  Pt denies Chest pain, worsening SOB, DOE, wheezing, orthopnea, PND, worsening LE edema, palpitations, dizziness or syncope.  Pt denies neurological change such as new headache, facial or extremity weakness.  Pt denies polydipsia, polyuria, or low sugar symptoms. Pt states overall good compliance with treatment and medications, good tolerability, and has been trying to follow appropriate diet.  Pt denies worsening depressive symptoms, suicidal ideation or panic. No fever, night sweats, wt loss, loss of appetite, or other constitutional symptoms.  Pt states good ability with ADL's, has low fall risk, home safety reviewed and adequate, no other significant changes in hearing or vision, and only occasionally active with exercise. Has had some increased balance and wobbly when standing too quickly. Pt continues to have recurring LBP without change in severity, bowel or bladder change, fever, wt loss,  worsening LE pain/numbness/weakness, gait change or falls. - cont's disabled x 11 yrs. Has lost significant wt intentionally with better diet. Wt Readings from Last 3 Encounters:  01/10/15 232 lb 1.9 oz (105.289 kg)  07/12/14 239 lb 2 oz (108.466 kg)  01/09/14 245 lb 8 oz (111.358 kg)  Did have polyp with last colonscopy 2011 but delcines f/u colonoscoy at this time. Past Medical History  Diagnosis Date  . Allergic rhinitis   . Asthma, mild   . Hypertension   . Venous insufficiency   . Hyperlipidemia   . Borderline diabetes mellitus   . Obesity   . GERD (gastroesophageal reflux disease)   . Diverticulosis of colon   . History of colonic polyps   . Hemorrhoids   . Lumbar back pain   . Generalized anxiety disorder   . Depression    Past Surgical History  Procedure Laterality Date  . Lumbar laminectomy  [id number]    reports that he has never smoked.  He has never used smokeless tobacco. He reports that he does not drink alcohol or use illicit drugs. family history is not on file. No Known Allergies Current Outpatient Prescriptions on File Prior to Visit  Medication Sig Dispense Refill  . ALPRAZolam (XANAX) 0.5 MG tablet TAKE 1 TABLET THREE TIMES DAILY AS NEEDED FOR ANXIETY OR SLEEP 90 tablet 2  . aspirin 81 MG tablet Take 81 mg by mouth daily.      . traMADol (ULTRAM) 50 MG tablet Take 1 tablet (50 mg total) by mouth every 8 (eight) hours as needed. 360 tablet 1   No current facility-administered medications on file prior to visit.   Review of Systems Constitutional: Negative for increased diaphoresis, other activity, appetite or siginficant weight change other than noted HENT: Negative for worsening hearing loss, ear pain, facial swelling, mouth sores and neck stiffness.   Eyes: Negative for other worsening pain, redness or visual disturbance.  Respiratory: Negative for shortness of breath and wheezing  Cardiovascular: Negative for chest pain and palpitations.  Gastrointestinal: Negative for diarrhea, blood in stool, abdominal distention or other pain Genitourinary: Negative for hematuria, flank pain or change in urine volume.  Musculoskeletal: Negative for myalgias or other joint complaints.  Skin: Negative for color change and wound or drainage.  Neurological: Negative for syncope and numbness. other than noted Hematological: Negative for adenopathy. or other swelling Psychiatric/Behavioral: Negative for hallucinations, SI, self-injury, decreased concentration or other worsening agitation.      Objective:  Physical Exam BP 110/62 mmHg  Pulse 56  Temp(Src) 97.9 F (36.6 C) (Oral)  Wt 232 lb 1.9 oz (105.289 kg)  SpO2 96% VS noted,  Constitutional: Pt is oriented to person, place, and time. Appears well-developed and well-nourished, in no significant distress Head: Normocephalic and atraumatic.  Right Ear: External ear  normal.  Left Ear: External ear normal.  Nose: Nose normal.  Mouth/Throat: Oropharynx is clear and moist.  Eyes: Conjunctivae and EOM are normal. Pupils are equal, round, and reactive to light.  Neck: Normal range of motion. Neck supple. No JVD present. No tracheal deviation present or significant neck LA or mass Cardiovascular: Normal rate, regular rhythm, normal heart sounds and intact distal pulses.   Pulmonary/Chest: Effort normal and breath sounds without rales or wheezing  Abdominal: Soft. Bowel sounds are normal. NT. No HSM  Musculoskeletal: Normal range of motion. Exhibits no edema.  Lymphadenopathy:  Has no cervical adenopathy.  Neurological: Pt is alert and oriented to person, place, and time. Pt has normal reflexes. No cranial nerve deficit. Motor grossly intact Skin: Skin is warm and dry. No rash noted.  Psychiatric:  Has irritable ? dysphoric mood and affect. Behavior is normal.     Assessment & Plan:

## 2015-01-10 NOTE — Assessment & Plan Note (Signed)
For a1c today, cont wt loss

## 2015-01-10 NOTE — Assessment & Plan Note (Signed)

## 2015-01-10 NOTE — Progress Notes (Signed)
Pre visit review using our clinic review tool, if applicable. No additional management support is needed unless otherwise documented below in the visit note. 

## 2015-01-18 ENCOUNTER — Encounter: Payer: Self-pay | Admitting: Gastroenterology

## 2015-01-25 ENCOUNTER — Other Ambulatory Visit: Payer: Self-pay | Admitting: Internal Medicine

## 2015-01-25 NOTE — Telephone Encounter (Signed)
MD out of office pls advise.../lmb 

## 2015-01-25 NOTE — Telephone Encounter (Signed)
Dr [name] gone  #90

## 2015-01-25 NOTE — Telephone Encounter (Signed)
MD only ok # 90 absence for Dr. [name] fax script to [facility]...Johny Chess

## 2015-03-07 ENCOUNTER — Other Ambulatory Visit: Payer: Self-pay | Admitting: Internal Medicine

## 2015-03-08 NOTE — Telephone Encounter (Signed)
Rx faxed to pharmacy  

## 2015-03-08 NOTE — Telephone Encounter (Signed)
Done hardcopy to Dahlia  

## 2015-04-13 ENCOUNTER — Telehealth: Payer: Self-pay | Admitting: Internal Medicine

## 2015-04-16 NOTE — Telephone Encounter (Signed)
Rx faxed to pharmacy  

## 2015-04-16 NOTE — Telephone Encounter (Signed)
Done hardcopy to Dahlia  

## 2015-04-17 NOTE — Telephone Encounter (Signed)
Rx re-faxed.

## 2015-04-17 NOTE — Telephone Encounter (Signed)
Pt called and he received an automated call from [facility] regarding the xanax. I do see that it was sent yesterday. Not sure if its necessary to resend to [facility]

## 2015-04-24 ENCOUNTER — Telehealth: Payer: Self-pay | Admitting: Internal Medicine

## 2015-04-24 DIAGNOSIS — M25561 Pain in right knee: Secondary | ICD-10-CM

## 2015-04-24 NOTE — Telephone Encounter (Signed)
He fell and injured his right knee last week and it's not getting better. It is very hard for him to walk. He is wanting you to send a referral over to Dr. Theda Sers at [place], because he seen him before about 20 yrs ago.

## 2015-04-24 NOTE — Telephone Encounter (Signed)
Referral done

## 2015-06-07 ENCOUNTER — Ambulatory Visit (INDEPENDENT_AMBULATORY_CARE_PROVIDER_SITE_OTHER): Payer: Commercial Managed Care - HMO

## 2015-06-07 DIAGNOSIS — Z23 Encounter for immunization: Secondary | ICD-10-CM | POA: Diagnosis not present

## 2015-07-22 ENCOUNTER — Telehealth: Payer: Self-pay | Admitting: *Deleted

## 2015-07-22 NOTE — Telephone Encounter (Signed)
Received call pt states he is needing refills on his alprazolam & Tramadol sent to [facility]. Inform pt md is out of office . Will give him call back tomorrow once md address msg...[name] [name]

## 2015-07-23 MED ORDER — ALPRAZOLAM 0.5 MG PO TABS: ORAL_TABLET | ORAL | Status: DC

## 2015-07-23 MED ORDER — TRAMADOL HCL 50 MG PO TABS: ORAL_TABLET | ORAL | Status: DC

## 2015-07-23 NOTE — Telephone Encounter (Signed)
Done hardcopy to Dahlia  

## 2015-07-23 NOTE — Telephone Encounter (Signed)
Rx faxed to pharmacy  

## 2015-07-24 NOTE — Telephone Encounter (Signed)
Called pt no answer LMOM rx's fax to [facility]...Johny Chess

## 2015-09-26 ENCOUNTER — Ambulatory Visit (INDEPENDENT_AMBULATORY_CARE_PROVIDER_SITE_OTHER): Payer: Commercial Managed Care - HMO | Admitting: Internal Medicine

## 2015-09-26 ENCOUNTER — Encounter: Payer: Self-pay | Admitting: Internal Medicine

## 2015-09-26 VITALS — BP 128/82 | HR 67 | Temp 98.6°F | Resp 20 | Wt 243.0 lb

## 2015-09-26 DIAGNOSIS — N179 Acute kidney failure, unspecified: Secondary | ICD-10-CM | POA: Insufficient documentation

## 2015-09-26 DIAGNOSIS — I10 Essential (primary) hypertension: Secondary | ICD-10-CM

## 2015-09-26 DIAGNOSIS — Z0189 Encounter for other specified special examinations: Secondary | ICD-10-CM | POA: Diagnosis not present

## 2015-09-26 DIAGNOSIS — R059 Cough, unspecified: Secondary | ICD-10-CM

## 2015-09-26 DIAGNOSIS — G8929 Other chronic pain: Secondary | ICD-10-CM

## 2015-09-26 DIAGNOSIS — Z0000 Encounter for general adult medical examination without abnormal findings: Secondary | ICD-10-CM

## 2015-09-26 DIAGNOSIS — R05 Cough: Secondary | ICD-10-CM

## 2015-09-26 DIAGNOSIS — M545 Low back pain, unspecified: Secondary | ICD-10-CM

## 2015-09-26 DIAGNOSIS — F329 Major depressive disorder, single episode, unspecified: Secondary | ICD-10-CM

## 2015-09-26 DIAGNOSIS — F32A Depression, unspecified: Secondary | ICD-10-CM

## 2015-09-26 MED ORDER — HYDROCODONE-HOMATROPINE 5-1.5 MG/5ML PO SYRP: 5.0000 mL | ORAL_SOLUTION | Freq: Four times a day (QID) | ORAL | Status: DC | PRN

## 2015-09-26 MED ORDER — AZITHROMYCIN 250 MG PO TABS: ORAL_TABLET | ORAL | Status: DC

## 2015-09-26 NOTE — Progress Notes (Signed)
Pre visit review using our clinic review tool, if applicable. No additional management support is needed unless otherwise documented below in the visit note. 

## 2015-09-26 NOTE — Patient Instructions (Addendum)
Your form was filled out today, and we'll have Dustin in the office working on getting it submitted  Please take all new medication as prescribed - the antibiotic, and cough medicine  Please continue all other medications as before, and refills have been done if requested.  Please have the pharmacy call with any other refills you may need.  Please keep your appointments with your specialists as you may have planned  Please return in 6 months, or sooner if needed, with Lab testing done 3-5 days before

## 2015-09-26 NOTE — Progress Notes (Signed)
Subjective:    Patient ID: [name] [name], male    DOB: [birth year], 65 y.o.   MRN: [id number]  HPI  Pt here to f./u with forms to be filled out for cont'd disability approval as he has had for many years.  Pt continues to have recurring LBP without change in severity, bowel or bladder change, fever, wt loss,  worsening LE pain/numbness/weakness, gait change or falls.  Has long hx over 20 yrs since earliest surgury with inability to work since that time. Incidentally - Here with acute onset mild to mod 2-3 days ST, HA, general weakness and malaise, with prod cough greenish sputum, but Pt denies chest pain, increased sob or doe, wheezing, orthopnea, PND, increased LE swelling, palpitations, dizziness or syncope. Pt denies new neurological symptoms such as new headache, or facial or extremity weakness or numbness other than above.   Pt denies polydipsia, polyuria.  Denies worsening depressive symptoms, suicidal ideation, or panic; has ongoing anxiety, has long verbal tangents today, difficult historian, cannot seem to focus, gets irritated when brought back to needed questioning Past Medical History  Diagnosis Date  . Allergic rhinitis   . Asthma, mild   . Hypertension   . Venous insufficiency   . Hyperlipidemia   . Borderline diabetes mellitus   . Obesity   . GERD (gastroesophageal reflux disease)   . Diverticulosis of colon   . History of colonic polyps   . Hemorrhoids   . Lumbar back pain   . Generalized anxiety disorder   . Depression    Past Surgical History  Procedure Laterality Date  . Lumbar laminectomy  [id number]    reports that he has never smoked. He has never used smokeless tobacco. He reports that he does not drink alcohol or use illicit drugs. family history is not on file. No Known Allergies Current Outpatient Prescriptions on File Prior to Visit  Medication Sig Dispense Refill  . ALPRAZolam (XANAX) 0.5 MG tablet TAKE 1 TABLET THREE TIMES DAILY AS NEEDED FOR ANXIETY  OR   SLEEP 90 tablet 2  . amLODipine (NORVASC) 5 MG tablet Take 1 tablet (5 mg total) by mouth daily. 90 tablet 3  . aspirin 81 MG tablet Take 81 mg by mouth daily.      Marland Kitchen losartan (COZAAR) 50 MG tablet Take 1 tablet (50 mg total) by mouth daily. 90 tablet 3  . omeprazole (PRILOSEC) 20 MG capsule Take 1 capsule (20 mg total) by mouth daily. 90 capsule 3  . traMADol (ULTRAM) 50 MG tablet TAKE 1 TABLET (50MG  TOTAL) EVERY 8 HOURS AS NEEDED 270 tablet 1   No current facility-administered medications on file prior to visit.   Review of Systems  Constitutional: Negative for unusual diaphoresis or night sweats HENT: Negative for ringing in ear or discharge Eyes: Negative for double vision or worsening visual disturbance.  Respiratory: Negative for choking and stridor.   Gastrointestinal: Negative for vomiting or other signifcant bowel change Genitourinary: Negative for hematuria or change in urine volume.  Musculoskeletal: Negative for other MSK pain or swelling Skin: Negative for color change and worsening wound.  Neurological: Negative for tremors and numbness other than noted  Psychiatric/Behavioral: Negative for decreased concentration or agitation other than above       Objective:   Physical Exam BP 128/82 mmHg  Pulse 67  Temp(Src) 98.6 F (37 C) (Oral)  Resp 20  Wt 243 lb (110.224 kg)  SpO2 97% VS noted, mild ill Constitutional: Pt appears in no significant  distress HENT: Head: NCAT.  Right Ear: External ear normal.  Left Ear: External ear normal.  Bilat tm's with mild erythema.  Max sinus areas non tender.  Pharynx with mild erythema, no exudate Eyes: . Pupils are equal, round, and reactive to light. Conjunctivae and EOM are normal Neck: Normal range of motion. Neck supple.  Cardiovascular: Normal rate and regular rhythm.   Pulmonary/Chest: Effort normal and breath sounds without rales or wheezing.  Neurological: Pt is alert. Not confused , motor grossly intact Skin: Skin is  warm. No rash, no LE edema Psychiatric: Pt behavior is normal. No agitation. but 2+ nervous, + depressed affect Spine: diffuse tender lumbar spine, worse lowest level    Assessment & Plan:

## 2015-09-27 ENCOUNTER — Telehealth: Payer: Self-pay

## 2015-09-27 DIAGNOSIS — Z7689 Persons encountering health services in other specified circumstances: Secondary | ICD-10-CM

## 2015-09-27 NOTE — Telephone Encounter (Signed)
Disability paperwork (? First page missing) was completed and faxed back to [phone] on 09/27/2015

## 2015-09-28 DIAGNOSIS — R059 Cough, unspecified: Secondary | ICD-10-CM | POA: Insufficient documentation

## 2015-09-28 DIAGNOSIS — R05 Cough: Secondary | ICD-10-CM | POA: Insufficient documentation

## 2015-09-28 NOTE — Assessment & Plan Note (Signed)
Overall stable, for forms filled out today to continue evidence for his permanent disability, cont all other same tx

## 2015-09-28 NOTE — Assessment & Plan Note (Signed)
Chronic persistent, overall stable, declines counseling referral, or further tx or referral

## 2015-09-28 NOTE — Assessment & Plan Note (Signed)
stable overall by history and exam, recent data reviewed with pt, and pt to continue medical treatment as before,  to f/u any worsening symptoms or concerns BP Readings from Last 3 Encounters:  09/26/15 128/82  01/10/15 110/62  07/12/14 112/70

## 2015-09-28 NOTE — Assessment & Plan Note (Signed)
Mild to mod, c/w bronchitis vs pna, declines cxr, for antibx empiric,  to f/u any worsening symptoms or concerns

## 2015-10-02 ENCOUNTER — Other Ambulatory Visit: Payer: Self-pay | Admitting: Internal Medicine

## 2015-11-21 ENCOUNTER — Other Ambulatory Visit: Payer: Self-pay | Admitting: Internal Medicine

## 2015-11-21 NOTE — Telephone Encounter (Signed)
Done hardcopy to Corinne  

## 2015-11-21 NOTE — Telephone Encounter (Signed)
Please advise 

## 2015-11-22 NOTE — Telephone Encounter (Signed)
Medication faxed to pharmacy 

## 2015-12-06 ENCOUNTER — Emergency Department (HOSPITAL_COMMUNITY): Payer: Commercial Managed Care - HMO

## 2015-12-06 ENCOUNTER — Encounter (HOSPITAL_COMMUNITY): Payer: Self-pay | Admitting: Emergency Medicine

## 2015-12-06 ENCOUNTER — Observation Stay (HOSPITAL_COMMUNITY)
Admission: EM | Admit: 2015-12-06 | Discharge: 2015-12-07 | Disposition: A | Discharge status: Home / Self Care | Payer: Commercial Managed Care - HMO | Attending: Internal Medicine | Admitting: Internal Medicine

## 2015-12-06 DIAGNOSIS — R079 Chest pain, unspecified: Principal | ICD-10-CM | POA: Insufficient documentation

## 2015-12-06 DIAGNOSIS — F411 Generalized anxiety disorder: Secondary | ICD-10-CM | POA: Diagnosis present

## 2015-12-06 DIAGNOSIS — I10 Essential (primary) hypertension: Secondary | ICD-10-CM | POA: Insufficient documentation

## 2015-12-06 DIAGNOSIS — Z79899 Other long term (current) drug therapy: Secondary | ICD-10-CM | POA: Insufficient documentation

## 2015-12-06 DIAGNOSIS — E785 Hyperlipidemia, unspecified: Secondary | ICD-10-CM | POA: Diagnosis present

## 2015-12-06 DIAGNOSIS — F329 Major depressive disorder, single episode, unspecified: Secondary | ICD-10-CM | POA: Diagnosis not present

## 2015-12-06 DIAGNOSIS — Z7982 Long term (current) use of aspirin: Secondary | ICD-10-CM | POA: Diagnosis not present

## 2015-12-06 DIAGNOSIS — R000 Tachycardia, unspecified: Secondary | ICD-10-CM | POA: Diagnosis not present

## 2015-12-06 DIAGNOSIS — F32A Depression, unspecified: Secondary | ICD-10-CM | POA: Diagnosis present

## 2015-12-06 DIAGNOSIS — R072 Precordial pain: Secondary | ICD-10-CM | POA: Diagnosis not present

## 2015-12-06 DIAGNOSIS — K219 Gastro-esophageal reflux disease without esophagitis: Secondary | ICD-10-CM | POA: Diagnosis not present

## 2015-12-06 DIAGNOSIS — J45909 Unspecified asthma, uncomplicated: Secondary | ICD-10-CM | POA: Diagnosis not present

## 2015-12-06 DIAGNOSIS — R7303 Prediabetes: Secondary | ICD-10-CM | POA: Insufficient documentation

## 2015-12-06 DIAGNOSIS — E119 Type 2 diabetes mellitus without complications: Secondary | ICD-10-CM | POA: Diagnosis present

## 2015-12-06 DIAGNOSIS — R0602 Shortness of breath: Secondary | ICD-10-CM | POA: Insufficient documentation

## 2015-12-06 LAB — BASIC METABOLIC PANEL
Anion gap: 12 (ref 5–15)
BUN: 11 mg/dL (ref 6–20)
CHLORIDE: 107 mmol/L (ref 101–111)
CO2: 23 mmol/L (ref 22–32)
Calcium: 9.1 mg/dL (ref 8.9–10.3)
Creatinine, Ser: 1.08 mg/dL (ref 0.61–1.24)
Glucose, Bld: 152 mg/dL — ABNORMAL HIGH (ref 65–99)
Potassium: 3.6 mmol/L (ref 3.5–5.1)
SODIUM: 142 mmol/L (ref 135–145)

## 2015-12-06 LAB — CBC WITH DIFFERENTIAL/PLATELET
BASOS PCT: 0 %
Basophils Absolute: 0 10*3/uL (ref 0.0–0.1)
EOS ABS: 0.1 10*3/uL (ref 0.0–0.7)
EOS PCT: 1 %
HCT: 46.7 % (ref 39.0–52.0)
Hemoglobin: 17 g/dL (ref 13.0–17.0)
LYMPHS ABS: 1 10*3/uL (ref 0.7–4.0)
Lymphocytes Relative: 11 %
MCH: 31.3 pg (ref 26.0–34.0)
MCHC: 36.4 g/dL — AB (ref 30.0–36.0)
MCV: 85.8 fL (ref 78.0–100.0)
MONO ABS: 0.5 10*3/uL (ref 0.1–1.0)
MONOS PCT: 6 %
NEUTROS PCT: 82 %
Neutro Abs: 7.8 10*3/uL — ABNORMAL HIGH (ref 1.7–7.7)
PLATELETS: 272 10*3/uL (ref 150–400)
RBC: 5.44 MIL/uL (ref 4.22–5.81)
RDW: 12.6 % (ref 11.5–15.5)
WBC: 9.5 10*3/uL (ref 4.0–10.5)

## 2015-12-06 LAB — TROPONIN I: Troponin I: <0.03 ng/mL (ref <0.031)

## 2015-12-06 MED ORDER — ACETAMINOPHEN 325 MG PO TABS: 650.0000 mg | ORAL_TABLET | ORAL | Status: DC | PRN

## 2015-12-06 MED ORDER — NITROGLYCERIN 0.4 MG SL SUBL: 0.4000 mg | SUBLINGUAL_TABLET | SUBLINGUAL | Status: DC | PRN

## 2015-12-06 MED ORDER — AMLODIPINE BESYLATE 5 MG PO TABS
5.0000 mg | ORAL_TABLET | Freq: Every day | ORAL | Status: DC
  Administered 2015-12-07: 5 mg via ORAL
  Filled 2015-12-06 (×2): qty 1

## 2015-12-06 MED ORDER — LOSARTAN POTASSIUM 50 MG PO TABS
50.0000 mg | ORAL_TABLET | Freq: Every day | ORAL | Status: DC
  Administered 2015-12-07: 50 mg via ORAL
  Filled 2015-12-06 (×2): qty 1

## 2015-12-06 MED ORDER — HEPARIN SODIUM (PORCINE) 5000 UNIT/ML IJ SOLN
5000.0000 [IU] | Freq: Three times a day (TID) | INTRAMUSCULAR | Status: DC
  Administered 2015-12-07 (×2): 5000 [IU] via SUBCUTANEOUS
  Filled 2015-12-06 (×5): qty 1

## 2015-12-06 MED ORDER — PANTOPRAZOLE SODIUM 40 MG PO TBEC
40.0000 mg | DELAYED_RELEASE_TABLET | Freq: Every day | ORAL | Status: DC
  Administered 2015-12-07: 40 mg via ORAL
  Filled 2015-12-06: qty 1

## 2015-12-06 MED ORDER — SENNOSIDES-DOCUSATE SODIUM 8.6-50 MG PO TABS: 1.0000 | ORAL_TABLET | Freq: Two times a day (BID) | ORAL | Status: DC | PRN

## 2015-12-06 MED ORDER — LORAZEPAM 2 MG/ML IJ SOLN
0.5000 mg | Freq: Once | INTRAMUSCULAR | Status: AC
  Administered 2015-12-06: 0.5 mg via INTRAVENOUS
  Filled 2015-12-06: qty 1

## 2015-12-06 MED ORDER — ASPIRIN 325 MG PO TABS
325.0000 mg | ORAL_TABLET | Freq: Every day | ORAL | Status: DC
  Administered 2015-12-07 (×2): 325 mg via ORAL
  Filled 2015-12-06 (×2): qty 1

## 2015-12-06 MED ORDER — ALPRAZOLAM 0.5 MG PO TABS
0.5000 mg | ORAL_TABLET | Freq: Three times a day (TID) | ORAL | Status: DC
  Administered 2015-12-07 (×2): 0.5 mg via ORAL
  Filled 2015-12-06 (×2): qty 1

## 2015-12-06 MED ORDER — SODIUM CHLORIDE 0.9 % IV SOLN
INTRAVENOUS | Status: DC
  Administered 2015-12-06: 21:00:00 via INTRAVENOUS

## 2015-12-06 MED ORDER — TRAMADOL HCL 50 MG PO TABS: 50.0000 mg | ORAL_TABLET | Freq: Four times a day (QID) | ORAL | Status: DC | PRN

## 2015-12-06 MED ORDER — MORPHINE SULFATE (PF) 2 MG/ML IV SOLN: 2.0000 mg | INTRAVENOUS | Status: DC | PRN

## 2015-12-06 MED ORDER — ZOLPIDEM TARTRATE 5 MG PO TABS: 5.0000 mg | ORAL_TABLET | Freq: Every evening | ORAL | Status: DC | PRN

## 2015-12-06 MED ORDER — SODIUM CHLORIDE 0.9 % IV SOLN
INTRAVENOUS | Status: DC
  Administered 2015-12-07: 03:00:00 via INTRAVENOUS

## 2015-12-06 MED ORDER — ONDANSETRON HCL 4 MG/2ML IJ SOLN: 4.0000 mg | Freq: Four times a day (QID) | INTRAMUSCULAR | Status: DC | PRN

## 2015-12-06 NOTE — ED Notes (Addendum)
Patient c/o chest pain to mid chest to mid abd. Patient states he has had some trouble breathing through his nose. Patient states he has not had alprazolam in past two days. Patient reports he has had this same chest pain, was diagnosed with anxiety at that time. Patient is able to speak in complete sentences at this time.

## 2015-12-06 NOTE — ED Provider Notes (Signed)
CSN: [id number]     Arrival date & time 12/06/15  1920 History   First MD Initiated Contact with Patient 12/06/15 1943     Chief Complaint  Patient presents with  . Chest Pain   (Consider location/radiation/quality/duration/timing/severity/associated sxs/prior Treatment) HPI 65 y.o. male with a hx of HTN, Anxiety, presents to the Emergency Department today complaining of chest pain that started this morning. Notes pain is 8-9/10 and intermittent. Lasts 30-63min then does away. Occurred at rest. Noted similar symptoms last night before he went to bed. No hx MI. No smoking. Does endorse FH with father who died of MI when he was 36 years old. No diaphoresis or N/V at symptom onset. States difficulty breathing through nose, but attributes to sinus congestion. Notes similar incident this year with chest pain, but diagnosed with anxiety. Pt does endorse being out of anxiety medication for the past 2 days and has not been able to take is usually QID doses of Alprazolam for anxiety. No other symptoms noted.    Past Medical History  Diagnosis Date  . Allergic rhinitis   . Asthma, mild   . Hypertension   . Venous insufficiency   . Hyperlipidemia   . Borderline diabetes mellitus   . Obesity   . GERD (gastroesophageal reflux disease)   . Diverticulosis of colon   . History of colonic polyps   . Hemorrhoids   . Lumbar back pain   . Generalized anxiety disorder   . Depression    Past Surgical History  Procedure Laterality Date  . Lumbar laminectomy  [id number]   History reviewed. No pertinent family history. Social History  Substance Use Topics  . Smoking status: Never Smoker   . Smokeless tobacco: Never Used  . Alcohol Use: No    Review of Systems ROS reviewed and all are negative for acute change except as noted in the HPI.  Allergies  Review of patient's allergies indicates no known allergies.  Home Medications   Prior to Admission medications   Medication Sig Start Date End Date  Taking? Authorizing Provider  ALPRAZolam [name]) 0.5 MG tablet TAKE 1 TABLET THREE TIMES DAILY AS NEEDED FOR ANXIETY  OR  SLEEP 11/21/15   Biagio Borg, MD  amLODipine (NORVASC) 5 MG tablet Take 1 tablet (5 mg total) by mouth daily. 01/10/15   Biagio Borg, MD  aspirin 81 MG tablet Take 81 mg by mouth daily.      Historical Provider, MD  azithromycin (ZITHROMAX Z-PAK) 250 MG tablet Use as directed 09/26/15   Biagio Borg, MD  HYDROcodone-homatropine [facility]) 5-1.5 MG/5ML syrup Take 5 mLs by mouth every 6 (six) hours as needed for cough. 09/26/15   Biagio Borg, MD  losartan (COZAAR) 50 MG tablet TAKE 1 TABLET DAILY 10/02/15   Biagio Borg, MD  omeprazole (PRILOSEC) 20 MG capsule Take 1 capsule (20 mg total) by mouth daily. 01/10/15   Biagio Borg, MD  traMADol (ULTRAM) 50 MG tablet TAKE 1 TABLET (50MG  TOTAL) EVERY 8 HOURS AS NEEDED 07/23/15   Biagio Borg, MD   BP 146/87 mmHg  Pulse 84  Temp(Src) 97.6 F (36.4 C) (Oral)  Resp 20  SpO2 99%   Physical Exam  Constitutional: He is oriented to person, place, and time. He appears well-developed and well-nourished.  HENT:  Head: Normocephalic and atraumatic.  Eyes: EOM are normal. Pupils are equal, round, and reactive to light.  Neck: Normal range of motion. Neck supple. No tracheal deviation present.  Cardiovascular: Normal rate, regular rhythm, normal heart sounds and intact distal pulses.   No murmur heard. Pulmonary/Chest: Effort normal and breath sounds normal. No respiratory distress. He has no wheezes. He has no rales. He exhibits no tenderness.  Abdominal: Soft. Bowel sounds are normal. There is no tenderness.  Musculoskeletal: Normal range of motion.  Neurological: He is alert and oriented to person, place, and time.  Skin: Skin is warm and dry.  Psychiatric: He has a normal mood and affect. His behavior is normal. Thought content normal.  Nursing note and vitals reviewed.  ED Course  Procedures (including critical care time) Labs  Review Labs Reviewed  CBC WITH DIFFERENTIAL/PLATELET - Abnormal; Notable for the following:    MCHC 36.4 (*)    Neutro Abs 7.8 (*)    All other components within normal limits  BASIC METABOLIC PANEL - Abnormal; Notable for the following:    Glucose, Bld 152 (*)    All other components within normal limits  TROPONIN I  TROPONIN I   Imaging Review No results found. I have personally reviewed and evaluated these images and lab results as part of my medical decision-making.   EKG Interpretation   Date/Time:  Friday December 06 2015 19:27:01 EDT Ventricular Rate:  116 PR Interval:  172 QRS Duration: 93 QT Interval:  335 QTC Calculation: 465 R Axis:   -28 Text Interpretation:  Sinus tachycardia Low voltage, extremity leads  Abnormal R-wave progression, late transition Minimal ST depression,  diffuse leads Confirmed by Zenia Resides  MD, ANTHONY ([id number]) on 12/06/2015 7:32:34  PM      MDM  I have reviewed and evaluated the relevant laboratory values. I have reviewed and evaluated the relevant imaging studies. I personally evaluated and interpreted the relevant EKG. I have reviewed the relevant previous healthcare records. I obtained HPI from historian. Patient discussed with supervising physician  ED Course:  Assessment: Pt is a 65yM Hx anxiety presents with CP since this morning. Risk Factors HTN, HLD. Given Ativan in ED with improvement of symptoms. Concern for cardiac etiology of Chest Pain. Medicine has been consulted and will see patient in the ED for likely admit for CP rule out. Heart Score 4. EKG shows minimal ST depression. Trop Negative. Pt does not meet criteria for CP protocol and a further evaluation is recommended. Pt has been re-evaluated prior to consult and VSS, NAD, heart RRR, pain 0/10, lungs CTAB. No acute abnormalities found on EKG and first round of cardiac enzymes negative.   Disposition/Plan:  Admit to Obs Pt acknowledges and agrees with plan  Supervising Physician  Lacretia Leigh, MD   Final diagnoses:  Chest pain, unspecified chest pain type        Shary Decamp, PA-C 12/06/15 2314

## 2015-12-06 NOTE — H&P (Signed)
[facility] History and Physical  DREDEN DIBERNARDO [id number] DOB: Mar 11, 1951 DOA: 12/06/2015  Referring physician: ED physician PCP: Cathlean Cower, MD  Specialists:   Chief Complaint: chest pain  HPI: [name] [name] is a 65 y.o. male with PMH of hypertension, hyperlipidemia, asthma, GERD, depression, anxiety, venous insufficiency, obesity, chronic back pain, diverticulosis, borderline diabetes not on medications, remote history of panic attacks, who presents with chest pain.  Patient reports that he started having chest pain in this morning. Chest pain is located in the central chest, initially 8 out of 10 in severity, radiating to the right neck, currently chest pain is 2 out of 10 in severity. It is associated with mild shortness of breath. Patient does not have cough, fever or chills. Patient has no recent long distance traveling. No tenderness over calf areas. His chest pain is not pleuritic. He has acid reflex symptoms. Patient does not have nausea, vomiting, diarrhea, abdominal pain or symptoms of UTI. Patient states he has not had alprazolam in past two days. Pt is nervous when I saw pt in ED.   In ED, patient was found to have negative troponin, WBC 9.5, temperature normal, initially tachycardia which has resolved, electrolytes and renal function okay. Chest x-ray is negative. Patients is admitted to inpatient for further management and treatment and observation.  EKG: Independently reviewed.  Initial EKG showed tachycardia with limb lead low voltage. The repeated EKG showed QTC 400, sinus rhythm, with limb lead low voltage.  Where does patient live?   At home  Can patient participate in ADLs?  Yes  Review of Systems:   General: no fevers, chills, no changes in body weight,  has fatigue and nervousness  HEENT: no blurry vision, hearing changes or sore throat Pulm: has dyspnea, no coughing, wheezing CV: has chest pain, no palpitations Abd: no nausea, vomiting, abdominal pain,  diarrhea, constipation GU: no dysuria, burning on urination, increased urinary frequency, hematuria  Ext: no leg edema Neuro: no unilateral weakness, numbness, or tingling, no vision change or hearing loss Skin: no rash MSK: No muscle spasm, no deformity, no limitation of range of movement in spin Heme: No easy bruising.  Travel history: No recent long distant travel.  Allergy: No Known Allergies  Past Medical History  Diagnosis Date  . Allergic rhinitis   . Asthma, mild   . Hypertension   . Venous insufficiency   . Hyperlipidemia   . Borderline diabetes mellitus   . Obesity   . GERD (gastroesophageal reflux disease)   . Diverticulosis of colon   . History of colonic polyps   . Hemorrhoids   . Lumbar back pain   . Generalized anxiety disorder   . Depression     Past Surgical History  Procedure Laterality Date  . Lumbar laminectomy  [id number]    Social History:  reports that he has never smoked. He has never used smokeless tobacco. He reports that he does not drink alcohol or use illicit drugs.  Family History:  Family History  Problem Relation Age of Onset  . Lung cancer Mother   . Stroke Father   . Heart attack Father      Prior to Admission medications   Medication Sig Start Date End Date Taking? Authorizing Provider  ALPRAZolam [name]) 0.5 MG tablet TAKE 1 TABLET THREE TIMES DAILY AS NEEDED FOR ANXIETY  OR  SLEEP 11/21/15  Yes Biagio Borg, MD  amLODipine (NORVASC) 5 MG tablet Take 1 tablet (5 mg total) by mouth daily.  01/10/15  Yes Biagio Borg, MD  aspirin 81 MG tablet Take 81 mg by mouth daily.     Yes Historical Provider, MD  losartan (COZAAR) 50 MG tablet TAKE 1 TABLET DAILY 10/02/15  Yes Biagio Borg, MD  omeprazole (PRILOSEC) 20 MG capsule Take 1 capsule (20 mg total) by mouth daily. 01/10/15  Yes Biagio Borg, MD  traMADol (ULTRAM) 50 MG tablet TAKE 1 TABLET (50MG  TOTAL) EVERY 8 HOURS AS NEEDED 07/23/15  Yes Biagio Borg, MD  azithromycin (ZITHROMAX Z-PAK)  250 MG tablet Use as directed Patient not taking: Reported on 12/06/2015 09/26/15   Biagio Borg, MD  HYDROcodone-homatropine [facility]) 5-1.5 MG/5ML syrup Take 5 mLs by mouth every 6 (six) hours as needed for cough. Patient not taking: Reported on 12/06/2015 09/26/15   Biagio Borg, MD    Physical Exam: Filed Vitals:   12/06/15 2100 12/06/15 2130 12/06/15 2200 12/06/15 2225  BP: 118/67 109/64 107/66   Pulse: 70 65 75 65  Temp:      TempSrc:      Resp: 11 11 12 17   Height:      Weight:      SpO2: 98% 95% 96% 100%   General: Not in acute distress. Pt is nervous. HEENT:       Eyes: PERRL, EOMI, no scleral icterus.       ENT: No discharge from the ears and nose, no pharynx injection, no tonsillar enlargement.        Neck: No JVD, no bruit, no mass felt. Heme: No neck lymph node enlargement. Cardiac: S1/S2, RRR, No murmurs, No gallops or rubs. Pulm: No rales, wheezing, rhonchi or rubs. Abd: Soft, nondistended, nontender, no rebound pain, no organomegaly, BS present. Ext: No pitting leg edema bilaterally. 2+DP/PT pulse bilaterally. Musculoskeletal: No joint deformities, No joint redness or warmth, no limitation of ROM in spin. Skin: No rashes.  Neuro: Alert, oriented X3, cranial nerves II-XII grossly intact, moves all extremities normally. Psych: Patient is not psychotic, no suicidal or hemocidal ideation.  Labs on Admission:  Basic Metabolic Panel:  Recent Labs Lab 12/06/15 2045  NA 142  K 3.6  CL 107  CO2 23  GLUCOSE 152*  BUN 11  CREATININE 1.08  CALCIUM 9.1   Liver Function Tests: No results for input(s): AST, ALT, ALKPHOS, BILITOT, PROT, ALBUMIN in the last 168 hours. No results for input(s): LIPASE, AMYLASE in the last 168 hours. No results for input(s): AMMONIA in the last 168 hours. CBC:  Recent Labs Lab 12/06/15 2045  WBC 9.5  NEUTROABS 7.8*  HGB 17.0  HCT 46.7  MCV 85.8  PLT 272   Cardiac Enzymes:  Recent Labs Lab 12/06/15 2045  TROPONINI <0.03     BNP (last 3 results) No results for input(s): BNP in the last 8760 hours.  ProBNP (last 3 results) No results for input(s): PROBNP in the last 8760 hours.  CBG: No results for input(s): GLUCAP in the last 168 hours.  Radiological Exams on Admission: Dg Chest 2 View  12/06/2015  CLINICAL DATA:  Patient c/o chest pain to mid chest to mid abd today. Patient states he has had some trouble breathing through his nose. Patient states he has not had alprazolam in past two days. Patient reports he has had this same chest pain previously diagnosed with anxiety. EXAM: CHEST  2 VIEW COMPARISON:  01/09/2013 FINDINGS: Cardiac silhouette is normal in size and configuration. Normal mediastinal and hilar contours. Clear lungs.  No  pleural effusion or pneumothorax. Bony thorax is intact. IMPRESSION: No active cardiopulmonary disease. Electronically Signed   By: Lajean Manes M.D.   On: 12/06/2015 20:22    Assessment/Plan Principal Problem:   Chest pain Active Problems:   Hyperlipidemia   ANXIETY DISORDER, GENERALIZED   Depression   Essential hypertension   Asthma   GERD   Pre-diabetes   Principal Problem:   Chest pain Active Problems:   Hyperlipidemia   ANXIETY DISORDER, GENERALIZED   Depression   Essential hypertension   Asthma   GERD   Pre-diabetes   Chest pain: pt seems to have panic attack or severe anxiety. Given his old age, history of hypertension, hyperlipidemia and prediabetes, will admit for chest pain rule out. No signs of DVT, no oxygen desaturation, and chest is nonpleuritic, less likely to have pulmonary embolism.  - Will admit to tele bed for observation - When necessary Xanax - cycle CE q6 x3 and repeat EKG in the am  - prn Nitroglycerin, Morphine, and aspirin - Risk factor stratification: will check FLP, UDS and A1C  - 2d echo - Protonix   Pre-diabetes: last A1c was 5.2 on 01/10/15. -cbg every morning. If significantly elevated-->will start SSI -f/u A1c  HLD:  Last LDL was not on record. No taking meds at home. -Check FLP  GERD: -Protonix  HTN: -Continue Losartan, amlodipine  Depression and anxiety: Stable, no suicidal or homicidal ideations. -Continue home medications: Xanax prn  Asthma: stable. Not taking meds at home. -prn albuterol.   DVT ppx: SQ Heparin (if pt develops severe chest pain or significantly elevated trop, will be easier to switch to IV heparin or stop heparin for procedure than using Lovenox).   Code Status: Full code Family Communication: None at bed side. Disposition Plan: Admit to inpatient  Date of Service 12/07/2015    Ivor Costa [facility] Pager [phone]  If 7PM-7AM, please contact night-coverage [email or web address] Password [email or web address] 12/07/2015, 12:03 AM

## 2015-12-06 NOTE — ED Notes (Signed)
Nurse starting IV drawing labs 

## 2015-12-06 NOTE — ED Provider Notes (Signed)
Medical screening examination/treatment/procedure(s) were performed by non-physician practitioner and as supervising physician I was immediately available for consultation/collaboration.   EKG Interpretation   Date/Time:  Friday December 06 2015 19:33:04 EDT Ventricular Rate:  112 PR Interval:  154 QRS Duration: 93 QT Interval:  349 QTC Calculation: 476 R Axis:   8 Text Interpretation:  Sinus tachycardia Low voltage, extremity leads  Borderline prolonged QT interval Baseline wander in lead(s) V3 No  significant change since last tracing Confirmed by Jayliana Valencia  MD, Malayja Freund  ([id number]) on 12/06/2015 9:41:07 PM     Patient here complaining of substernal chest heaviness and tightness lasting for 30 minutes with associated diaphoresis and shortness of breath. Currently pain-free at this time. His EKG shows no signs of ischemic changes. First troponin negative. Have offered patient admission for evaluation and he is considering it  Lacretia Leigh, MD 12/06/15 2227

## 2015-12-07 ENCOUNTER — Encounter (HOSPITAL_COMMUNITY): Payer: Self-pay | Admitting: Oncology

## 2015-12-07 ENCOUNTER — Other Ambulatory Visit (HOSPITAL_COMMUNITY): Payer: Commercial Managed Care - HMO

## 2015-12-07 DIAGNOSIS — I10 Essential (primary) hypertension: Secondary | ICD-10-CM | POA: Diagnosis not present

## 2015-12-07 DIAGNOSIS — F329 Major depressive disorder, single episode, unspecified: Secondary | ICD-10-CM

## 2015-12-07 DIAGNOSIS — K219 Gastro-esophageal reflux disease without esophagitis: Secondary | ICD-10-CM

## 2015-12-07 DIAGNOSIS — R079 Chest pain, unspecified: Secondary | ICD-10-CM

## 2015-12-07 DIAGNOSIS — R0602 Shortness of breath: Secondary | ICD-10-CM | POA: Insufficient documentation

## 2015-12-07 DIAGNOSIS — E785 Hyperlipidemia, unspecified: Secondary | ICD-10-CM

## 2015-12-07 LAB — TROPONIN I
Troponin I: <0.03 ng/mL (ref <0.031)
Troponin I: <0.03 ng/mL (ref <0.031)

## 2015-12-07 LAB — PROTIME-INR
INR: 1.2 (ref 0.00–1.49)
PROTHROMBIN TIME: 14.9 s (ref 11.6–15.2)

## 2015-12-07 LAB — RAPID URINE DRUG SCREEN, HOSP PERFORMED
AMPHETAMINES: NOT DETECTED
Barbiturates: NOT DETECTED
Benzodiazepines: POSITIVE — AB
COCAINE: NOT DETECTED
OPIATES: NOT DETECTED
TETRAHYDROCANNABINOL: NOT DETECTED

## 2015-12-07 LAB — LIPID PANEL
Cholesterol: 107 mg/dL (ref 0–200)
HDL: 27 mg/dL — ABNORMAL LOW (ref >40)
LDL CALC: 55 mg/dL (ref 0–99)
Total CHOL/HDL Ratio: 4 RATIO
Triglycerides: 123 mg/dL (ref <150)
VLDL: 25 mg/dL (ref 0–40)

## 2015-12-07 LAB — GLUCOSE, CAPILLARY: Glucose-Capillary: 109 mg/dL — ABNORMAL HIGH (ref 65–99)

## 2015-12-07 LAB — APTT: aPTT: 26 seconds (ref 24–37)

## 2015-12-07 MED ORDER — ALBUTEROL SULFATE (2.5 MG/3ML) 0.083% IN NEBU: 2.5000 mg | INHALATION_SOLUTION | RESPIRATORY_TRACT | Status: DC | PRN

## 2015-12-07 MED ORDER — OMEPRAZOLE 20 MG PO CPDR: 40.0000 mg | DELAYED_RELEASE_CAPSULE | Freq: Every day | ORAL | Status: DC

## 2015-12-07 NOTE — Discharge Summary (Signed)
Physician Discharge Summary  [name] [name] [id number] DOB: 1950-10-01 DOA: 12/06/2015  PCP: Cathlean Cower, MD  Admit date: 12/06/2015 Discharge date: 12/07/2015  Time spent: 35 minutes  Recommendations for Outpatient Follow-up:  Follow A1C and CBG Please reassess BP and adjust medications as needed Repeat BMET to follow electrolytes and renal function Needs outpatient follow up with GI for follow up colonoscopy screening (he is due for it) Outpatient follow up with cardiology for stress test   Discharge Diagnoses:  Principal Problem:   Chest pain Active Problems:   Hyperlipidemia   ANXIETY DISORDER, GENERALIZED   Depression   Essential hypertension   Asthma   GERD   Pre-diabetes   Discharge Condition: stable and improved. Will discharge home with instructions to follow up with PCP and cardiology service for outpatient stress test.  Diet recommendation: heart healthy diet and modified carbohydrates   Filed Weights   12/07/15 0152  Weight: 107 kg (235 lb 14.3 oz)    History of present illness:  As per H&P written by Dr. Blaine Hamper on 12/06/15 65 y.o. male with PMH of hypertension, hyperlipidemia, asthma, GERD, depression, anxiety, venous insufficiency, obesity, chronic back pain, diverticulosis, borderline diabetes not on medications, remote history of panic attacks, who presents with chest pain.  Patient reports that he started having chest pain in this morning. Chest pain is located in the central chest, initially 8 out of 10 in severity, radiating to the right neck, currently chest pain is 2 out of 10 in severity. It is associated with mild shortness of breath. Patient does not have cough, fever or chills. Patient has no recent long distance traveling. No tenderness over calf areas. His chest pain is not pleuritic. He has acid reflex symptoms. Patient does not have nausea, vomiting, diarrhea, abdominal pain or symptoms of UTI. Patient states he has not had alprazolam in past two days.  Pt is nervous when I saw pt in ED.   Hospital Course:  Chest pain: pt seems to have panic attack or severe anxiety. Given his old age, history of hypertension, hyperlipidemia and prediabetes, was admit for chest pain rule out.  -no abnormalities seen on telemetry or EKG -troponin neg X 4 -patient with resolution of CP with analgesics and PPI -most likely GERD as etiology -giving risk factors, cardiology was curbside and recommended outpatient Stress test   Pre-diabetes: last A1c was 5.2 on 01/10/15. -continue low carbohydrates and outpatient follow up with PCP  HLD: Last LDL was not on record. No taking meds at home. -advise to follow low fat diet -patient will like to follow with PCP before starting any medication -LDL 55, HDL 27  GERD: -will continue Prilosec, but dose adjusted to 40 mg daily for better symptoms control  HTN: -Continue Losartan and amlodipine -advise to follow low sodium diet  Depression and anxiety: Stable, no suicidal or homicidal ideations. -Continue home medications  Asthma: stable.  -Not taking meds at home. -no wheezing and good O2 sat on RA  Procedures:  See below for x-ray reports   Consultations:  Cardiology curbside (Dr. Radford Pax, recommended outpatient stress test. Cardiology office will set up)  Discharge Exam: Filed Vitals:   12/07/15 0609 12/07/15 1433  BP: 110/67 129/77  Pulse: 61 70  Temp: 98 F (36.7 C) 98.1 F (36.7 C)  Resp: 18 18    General: afebrile, denies CP and SOB. Reports some allergic rhinitis symptoms, but otherwise no complaints Cardiovascular: S1 and S2, no rubs or gallops Respiratory: good air movement, no  wheezing; scattered rhonchi appreciated Abd: soft, NT, ND, positive BS Extremities: trace edema, no cyanosis   Discharge Instructions   Discharge Instructions    Diet - low sodium heart healthy    Complete by:  As directed      Discharge instructions    Complete by:  As directed   Keep yourself well  hydrated Take medications as prescribed Follow with PCP in 10 days Please make sure to follow with cardiology as an outpatient for stress test and further care/evaluation as needed          Current Discharge Medication List    CONTINUE these medications which have CHANGED   Details  omeprazole (PRILOSEC) 20 MG capsule Take 2 capsules (40 mg total) by mouth daily. Qty: 60 capsule, Refills: 3      CONTINUE these medications which have NOT CHANGED   Details  ALPRAZolam (XANAX) 0.5 MG tablet TAKE 1 TABLET THREE TIMES DAILY AS NEEDED FOR ANXIETY  OR  SLEEP Qty: 90 tablet, Refills: 2    amLODipine (NORVASC) 5 MG tablet Take 1 tablet (5 mg total) by mouth daily. Qty: 90 tablet, Refills: 3    aspirin 81 MG tablet Take 81 mg by mouth daily.      losartan (COZAAR) 50 MG tablet TAKE 1 TABLET DAILY Qty: 90 tablet, Refills: 3    traMADol (ULTRAM) 50 MG tablet TAKE 1 TABLET (50MG  TOTAL) EVERY 8 HOURS AS NEEDED Qty: 270 tablet, Refills: 1      STOP taking these medications     azithromycin (ZITHROMAX Z-PAK) 250 MG tablet      HYDROcodone-homatropine (HYCODAN) 5-1.5 MG/5ML syrup        No Known Allergies Follow-up Information    Follow up with Cathlean Cower, MD In 10 days.   Specialties:  Internal Medicine, Radiology   Contact information:   [place] [id number] [phone]       The results of significant diagnostics from this hospitalization (including imaging, microbiology, ancillary and laboratory) are listed below for reference.    Significant Diagnostic Studies: Dg Chest 2 View  12/06/2015  CLINICAL DATA:  Patient c/o chest pain to mid chest to mid abd today. Patient states he has had some trouble breathing through his nose. Patient states he has not had alprazolam in past two days. Patient reports he has had this same chest pain previously diagnosed with anxiety. EXAM: CHEST  2 VIEW COMPARISON:  01/09/2013 FINDINGS: Cardiac silhouette is normal in  size and configuration. Normal mediastinal and hilar contours. Clear lungs.  No pleural effusion or pneumothorax. Bony thorax is intact. IMPRESSION: No active cardiopulmonary disease. Electronically Signed   By: Lajean Manes M.D.   On: 12/06/2015 20:22   Labs: Basic Metabolic Panel:  Recent Labs Lab 12/06/15 2045  NA 142  K 3.6  CL 107  CO2 23  GLUCOSE 152*  BUN 11  CREATININE 1.08  CALCIUM 9.1   CBC:  Recent Labs Lab 12/06/15 2045  WBC 9.5  NEUTROABS 7.8*  HGB 17.0  HCT 46.7  MCV 85.8  PLT 272   Cardiac Enzymes:  Recent Labs Lab 12/06/15 2045 12/06/15 2346 12/07/15 0413 12/07/15 1120  TROPONINI <0.03 <0.03 <0.03 <0.03   CBG:  Recent Labs Lab 12/07/15 0843  GLUCAP 109*    Signed:  Barton Dubois MD.  [facility] 12/07/2015, 4:36 PM

## 2015-12-08 ENCOUNTER — Other Ambulatory Visit: Payer: Self-pay | Admitting: Cardiology

## 2015-12-08 DIAGNOSIS — R079 Chest pain, unspecified: Secondary | ICD-10-CM

## 2015-12-09 LAB — HEMOGLOBIN A1C
Hgb A1c MFr Bld: 5.5 % (ref 4.8–5.6)
Mean Plasma Glucose: 111 mg/dL

## 2015-12-10 ENCOUNTER — Telehealth: Payer: Self-pay | Admitting: Internal Medicine

## 2015-12-10 NOTE — Telephone Encounter (Signed)
Please advise can we do this 

## 2015-12-10 NOTE — Telephone Encounter (Signed)
Two prescriptions would be VERY unusual   Normally I would be OK for one prescription for pt to use locally; I am no longer comfortable with [employer] and local pharmacy due to increased scrutiny by regulatory entities on physicians  NO prescription is done at this time, as pt should have 3 mo total tx from mar 27 prescription

## 2015-12-10 NOTE — Telephone Encounter (Signed)
Pt states [employer] did not receive ALPRAZolam (XANAX) 0.5 MG tablet [id number] last month. Can you please resend it over to [place] and send a month supply to [facility] on [address]  He is out of his medication

## 2015-12-11 ENCOUNTER — Other Ambulatory Visit: Payer: Self-pay | Admitting: Emergency Medicine

## 2015-12-11 ENCOUNTER — Other Ambulatory Visit: Payer: Self-pay | Admitting: Internal Medicine

## 2015-12-11 DIAGNOSIS — F419 Anxiety disorder, unspecified: Secondary | ICD-10-CM | POA: Diagnosis not present

## 2015-12-11 DIAGNOSIS — F99 Mental disorder, not otherwise specified: Secondary | ICD-10-CM | POA: Diagnosis not present

## 2015-12-11 DIAGNOSIS — F29 Unspecified psychosis not due to a substance or known physiological condition: Secondary | ICD-10-CM | POA: Diagnosis not present

## 2015-12-11 MED ORDER — ALPRAZOLAM 0.5 MG PO TABS: ORAL_TABLET | ORAL | Status: DC

## 2015-12-11 NOTE — Telephone Encounter (Signed)
RX for Xanax has been phoned into pharm per MDs approval. Pt needs appt for further refills.

## 2015-12-11 NOTE — Telephone Encounter (Signed)
Pt called back to check up on this request. Spoke with stacy from [place], she said that last time pt refill this med was 10/17/15 (nothing for 11/25/15). She said that they were trying to get new rx for this med from our office since 11/20/15 but never heard anything from our office.   Pt had a panic attack this more and he was advise by EMS people that he need to be back on this med since he is out for 2 weeks now. Please help, he was wondering if we can send something into [facility] today to prevent the panic attack reoccur,. Please call pt today  # [phone]

## 2015-12-11 NOTE — Telephone Encounter (Signed)
Done hardcopy to Corinne  

## 2015-12-12 ENCOUNTER — Encounter: Payer: Self-pay | Admitting: Gastroenterology

## 2015-12-12 NOTE — Telephone Encounter (Signed)
Called pt to verify where he would like for rx to go. Pt states actually he called late yesterday (Wed) afternoon, and MD called in a rx to [facility], and he was able to pick up yesterday. He fought out that its cheaper to get from local pharmacy anyway. He will start getting it fill through his local pharmacy. He has an appt on 4/19 will leave this rx for pick-up on that day & he will take to pharmacy. Put rx up front for pick-up...[name] [name]

## 2015-12-18 ENCOUNTER — Encounter: Payer: Self-pay | Admitting: Internal Medicine

## 2015-12-18 ENCOUNTER — Other Ambulatory Visit (INDEPENDENT_AMBULATORY_CARE_PROVIDER_SITE_OTHER): Payer: Commercial Managed Care - HMO

## 2015-12-18 ENCOUNTER — Ambulatory Visit (INDEPENDENT_AMBULATORY_CARE_PROVIDER_SITE_OTHER): Payer: Commercial Managed Care - HMO | Admitting: Internal Medicine

## 2015-12-18 VITALS — BP 140/80 | HR 69 | Resp 20 | Wt 244.0 lb

## 2015-12-18 DIAGNOSIS — R6889 Other general symptoms and signs: Secondary | ICD-10-CM

## 2015-12-18 DIAGNOSIS — Z0001 Encounter for general adult medical examination with abnormal findings: Secondary | ICD-10-CM

## 2015-12-18 DIAGNOSIS — I10 Essential (primary) hypertension: Secondary | ICD-10-CM

## 2015-12-18 DIAGNOSIS — R7303 Prediabetes: Secondary | ICD-10-CM

## 2015-12-18 DIAGNOSIS — R079 Chest pain, unspecified: Secondary | ICD-10-CM | POA: Diagnosis not present

## 2015-12-18 DIAGNOSIS — E785 Hyperlipidemia, unspecified: Secondary | ICD-10-CM

## 2015-12-18 LAB — BASIC METABOLIC PANEL
BUN: 8 mg/dL (ref 6–23)
CHLORIDE: 104 meq/L (ref 96–112)
CO2: 27 meq/L (ref 19–32)
CREATININE: 0.97 mg/dL (ref 0.40–1.50)
Calcium: 9.6 mg/dL (ref 8.4–10.5)
GFR: 82.77 mL/min (ref >60.00)
Glucose, Bld: 93 mg/dL (ref 70–99)
POTASSIUM: 4.8 meq/L (ref 3.5–5.1)
SODIUM: 141 meq/L (ref 135–145)

## 2015-12-18 LAB — PSA: PSA: 0.89 ng/mL (ref 0.10–4.00)

## 2015-12-18 NOTE — Assessment & Plan Note (Signed)
stable overall by history and exam, recent data reviewed with pt, and pt to continue medical treatment as before,  to f/u any worsening symptoms or concerns Lab Results  Component Value Date   LDLCALC 55 12/07/2015    

## 2015-12-18 NOTE — Progress Notes (Signed)
Pre visit review using our clinic review tool, if applicable. No additional management support is needed unless otherwise documented below in the visit note. 

## 2015-12-18 NOTE — Assessment & Plan Note (Signed)
stable overall by history and exam, recent data reviewed with pt, and pt to continue medical treatment as before,  to f/u any worsening symptoms or concerns BP Readings from Last 3 Encounters:  12/18/15 140/80  12/07/15 129/77  09/26/15 128/82

## 2015-12-18 NOTE — Assessment & Plan Note (Addendum)
D/w pt, etiology unclear, declines stress test for now  In addition to the time spent performing CPE, I spent an additional 40 minutes face to face,in which greater than 50% of this time was spent in counseling and coordination of care for patient's acute illness as documented.

## 2015-12-18 NOTE — Assessment & Plan Note (Signed)

## 2015-12-18 NOTE — Progress Notes (Signed)
Subjective:    Patient ID: [name] [name], male    DOB: 1951-04-30, 64 y.o.   MRN: [id number]  HPI  Here for wellness and f/u;  Overall doing ok;  Pt denies Chest pain, worsening SOB, DOE, wheezing, orthopnea, PND, worsening LE edema, palpitations, dizziness or syncope.  Pt denies neurological change such as new headache, facial or extremity weakness.  Pt denies polydipsia, polyuria, or low sugar symptoms. Pt states overall good compliance with treatment and medications, good tolerability, and has been trying to follow appropriate diet.  Pt denies worsening depressive symptoms, suicidal ideation or panic. No fever, night sweats, wt loss, loss of appetite, or other constitutional symptoms.  Pt states good ability with ADL's, has low fall risk, home safety reviewed and adequate, no other significant changes in hearing or vision, and only occasionally active with exercise.  BP Readings from Last 3 Encounters:  12/18/15 140/80  12/07/15 129/77  09/26/15 128/82  Had recent constipation, advised for stool softner.  Just hospd with anxiety and chest pain after running out of benzo for a few days - so ? Anxiety, reflux, constipation related vs cardiac - stress test ordered, and pt states he was called, but declined as he did not feel well, and felt he needed to see a lawyer regarding his disability. Does not want re-scheduled at this time. Rec'd for BMP at today f/u.  Recent a1c normal.  Due for PSA. Due for colonoscopy for now, as he will wants to make sure he has transportation first with a cousin. Past Medical History  Diagnosis Date  . Allergic rhinitis   . Asthma, mild   . Hypertension   . Venous insufficiency   . Hyperlipidemia   . Borderline diabetes mellitus   . Obesity   . GERD (gastroesophageal reflux disease)   . Diverticulosis of colon   . History of colonic polyps   . Hemorrhoids   . Lumbar back pain   . Generalized anxiety disorder   . Depression    Past Surgical History    Procedure Laterality Date  . Lumbar laminectomy  [id number]    reports that he has never smoked. He has never used smokeless tobacco. He reports that he does not drink alcohol or use illicit drugs. family history includes Heart attack in his father; Lung cancer in his mother; Stroke in his father. No Known Allergies Current Outpatient Prescriptions on File Prior to Visit  Medication Sig Dispense Refill  . ALPRAZolam (XANAX) 0.5 MG tablet TAKE 1 TABLET THREE TIMES DAILY AS NEEDED FOR ANXIETY  OR  SLEEP 90 tablet 2  . amLODipine (NORVASC) 5 MG tablet TAKE 1 TABLET DAILY 90 tablet 3  . aspirin 81 MG tablet Take 81 mg by mouth daily.      Marland Kitchen losartan (COZAAR) 50 MG tablet TAKE 1 TABLET DAILY 90 tablet 3  . omeprazole (PRILOSEC) 20 MG capsule Take 2 capsules (40 mg total) by mouth daily. 60 capsule 3  . traMADol (ULTRAM) 50 MG tablet TAKE 1 TABLET (50MG  TOTAL) EVERY 8 HOURS AS NEEDED 270 tablet 1   No current facility-administered medications on file prior to visit.     Review of Systems Constitutional: Negative for increased diaphoresis, or other activity, appetite or siginficant weight change other than noted HENT: Negative for worsening hearing loss, ear pain, facial swelling, mouth sores and neck stiffness.   Eyes: Negative for other worsening pain, redness or visual disturbance.  Respiratory: Negative for choking or stridor Cardiovascular: Negative for other  chest pain and palpitations.  Gastrointestinal: Negative for worsening diarrhea, blood in stool, or abdominal distention Genitourinary: Negative for hematuria, flank pain or change in urine volume.  Musculoskeletal: Negative for myalgias or other joint complaints.  Skin: Negative for other color change and wound or drainage.  Neurological: Negative for syncope and numbness. other than noted Hematological: Negative for adenopathy. or other swelling Psychiatric/Behavioral: Negative for hallucinations, SI, self-injury, decreased  concentration or other worsening agitation.      Objective:   Physical Exam BP 140/80 mmHg  Pulse 69  Resp 20  Wt 244 lb (110.678 kg)  SpO2 98% VS noted,  Constitutional: Pt is oriented to person, place, and time. Appears well-developed and well-nourished, in no significant distress Head: Normocephalic and atraumatic  Eyes: Conjunctivae and EOM are normal. Pupils are equal, round, and reactive to light Right Ear: External ear normal.  Left Ear: External ear normal Nose: Nose normal.  Mouth/Throat: Oropharynx is clear and moist  Neck: Normal range of motion. Neck supple. No JVD present. No tracheal deviation present or significant neck LA or mass Cardiovascular: Normal rate, regular rhythm, normal heart sounds and intact distal pulses.   Pulmonary/Chest: Effort normal and breath sounds without rales or wheezing  Abdominal: Soft. Bowel sounds are normal. NT. No HSM  Musculoskeletal: Normal range of motion. Exhibits no edema Lymphadenopathy: Has no cervical adenopathy.  Neurological: Pt is alert and oriented to person, place, and time. Pt has normal reflexes. No cranial nerve deficit. Motor grossly intact Skin: Skin is warm and dry. No rash noted or new ulcers Psychiatric:  Has 1-2+ nervous mood and affect. Behavior is normal.     Assessment & Plan:

## 2015-12-18 NOTE — Assessment & Plan Note (Signed)
stable overall by history and exam, recent data reviewed with pt, and pt to continue medical treatment as before,  to f/u any worsening symptoms or concerns Lab Results  Component Value Date   HGBA1C 5.5 12/07/2015

## 2015-12-18 NOTE — Patient Instructions (Addendum)

## 2016-02-13 ENCOUNTER — Telehealth: Payer: Self-pay | Admitting: Internal Medicine

## 2016-02-13 MED ORDER — OMEPRAZOLE 20 MG PO CPDR: 40.0000 mg | DELAYED_RELEASE_CAPSULE | Freq: Every day | ORAL | Status: DC

## 2016-02-13 NOTE — Telephone Encounter (Signed)
prilosec done erx 

## 2016-02-13 NOTE — Telephone Encounter (Signed)
Patient is requesting a refill [facility] to [facility]: omeprazole (PRILOSEC) 20 MG capsule [id number]

## 2016-02-21 ENCOUNTER — Other Ambulatory Visit: Payer: Self-pay | Admitting: Internal Medicine

## 2016-02-21 NOTE — Telephone Encounter (Signed)
Medication faxed to pharmacy 

## 2016-02-21 NOTE — Telephone Encounter (Signed)
Done hardcopy to Corinne  

## 2016-02-21 NOTE — Telephone Encounter (Signed)
Please advise 

## 2016-03-04 NOTE — Telephone Encounter (Signed)
Patient walked in regarding RX. He states [facility] does not have. Asking corrine to fax again. She is going to fax today

## 2016-03-26 ENCOUNTER — Encounter: Payer: Self-pay | Admitting: Internal Medicine

## 2016-03-30 MED ORDER — TRAMADOL HCL 50 MG PO TABS: 50.0000 mg | ORAL_TABLET | Freq: Three times a day (TID) | ORAL | 1 refills | Status: DC | PRN

## 2016-03-30 NOTE — Telephone Encounter (Signed)
Ok got to [employer] to [employer]

## 2016-04-01 ENCOUNTER — Encounter: Payer: Self-pay | Admitting: Internal Medicine

## 2016-04-02 ENCOUNTER — Encounter: Payer: Self-pay | Admitting: Internal Medicine

## 2016-04-02 MED ORDER — ALPRAZOLAM 0.5 MG PO TABS: ORAL_TABLET | ORAL | 2 refills | Status: DC

## 2016-04-02 NOTE — Telephone Encounter (Signed)
Xanax Done hardcopy to [employer]

## 2016-04-07 ENCOUNTER — Telehealth: Payer: Self-pay

## 2016-04-07 ENCOUNTER — Other Ambulatory Visit: Payer: Self-pay

## 2016-04-07 ENCOUNTER — Other Ambulatory Visit: Payer: Self-pay | Admitting: Internal Medicine

## 2016-04-07 DIAGNOSIS — Z86010 Personal history of colonic polyps: Secondary | ICD-10-CM

## 2016-04-07 DIAGNOSIS — Z1159 Encounter for screening for other viral diseases: Secondary | ICD-10-CM

## 2016-04-07 MED ORDER — ALPRAZOLAM 0.5 MG PO TABS: ORAL_TABLET | ORAL | 2 refills | Status: DC

## 2016-04-07 NOTE — Telephone Encounter (Signed)
Pt came in today. He would like to know if there is sleeping pill he could take since we would not increase his alprazolam dosage.  Also, he is new to MyChart and his overdue Health Maintenance seems to add to his anxiety.  When should he have his next colonoscopy? Should he have the Hep C testing?  He brought in a shot record for shingles from 2009. I will give to [facility] for abstracting.

## 2016-04-07 NOTE — Progress Notes (Unsigned)
Per Dr. [name] this medication was placed on my desk on 04/02/2016. I was out of the office on this day and no one remembers seeing printed prescription. Due to no documentation I have printed this out again and will have Dr. [name] to sign and I will send to patients pharmacy.

## 2016-04-07 NOTE — Telephone Encounter (Signed)
Pt wants to know if he can get a prescription refill on ALPRAZolam (XANAX) 0.5 MG tablet. Pharamacy is  [address]. Please follow up thanks.

## 2016-04-07 NOTE — Telephone Encounter (Signed)
Ok for melatonin OTC qhs in addition to the xanax  [facility] for colonscopy if he now wants this; pt had declined in April  OK for Hep C testing at his convenience

## 2016-04-08 NOTE — Telephone Encounter (Signed)
Please advise patient would like to have the colonoscopy done, please place the referral so that he can have it done. Also he would like to have the Hep C testing done.

## 2016-04-08 NOTE — Telephone Encounter (Signed)
Note also sent through [place].

## 2016-05-19 ENCOUNTER — Ambulatory Visit (INDEPENDENT_AMBULATORY_CARE_PROVIDER_SITE_OTHER): Payer: Commercial Managed Care - HMO

## 2016-05-19 DIAGNOSIS — Z23 Encounter for immunization: Secondary | ICD-10-CM | POA: Diagnosis not present

## 2016-07-01 ENCOUNTER — Encounter: Payer: Self-pay | Admitting: Internal Medicine

## 2016-07-02 MED ORDER — ALPRAZOLAM 0.5 MG PO TABS: ORAL_TABLET | ORAL | 2 refills | Status: DC

## 2016-07-02 NOTE — Telephone Encounter (Signed)
Done hardcopy to Corinne  

## 2016-07-21 ENCOUNTER — Ambulatory Visit (INDEPENDENT_AMBULATORY_CARE_PROVIDER_SITE_OTHER): Payer: Commercial Managed Care - HMO | Admitting: Internal Medicine

## 2016-07-21 ENCOUNTER — Ambulatory Visit (INDEPENDENT_AMBULATORY_CARE_PROVIDER_SITE_OTHER)
Admission: RE | Admit: 2016-07-21 | Discharge: 2016-07-21 | Disposition: A | Discharge status: Home / Self Care | Payer: Commercial Managed Care - HMO | Source: Ambulatory Visit | Attending: Internal Medicine | Admitting: Internal Medicine

## 2016-07-21 ENCOUNTER — Encounter: Payer: Self-pay | Admitting: Internal Medicine

## 2016-07-21 VITALS — BP 138/78 | HR 74 | Temp 98.6°F | Resp 20 | Wt 248.0 lb

## 2016-07-21 DIAGNOSIS — I10 Essential (primary) hypertension: Secondary | ICD-10-CM | POA: Diagnosis not present

## 2016-07-21 DIAGNOSIS — J4520 Mild intermittent asthma, uncomplicated: Secondary | ICD-10-CM

## 2016-07-21 DIAGNOSIS — R7303 Prediabetes: Secondary | ICD-10-CM

## 2016-07-21 DIAGNOSIS — L989 Disorder of the skin and subcutaneous tissue, unspecified: Secondary | ICD-10-CM

## 2016-07-21 DIAGNOSIS — Z0001 Encounter for general adult medical examination with abnormal findings: Secondary | ICD-10-CM

## 2016-07-21 DIAGNOSIS — R05 Cough: Secondary | ICD-10-CM | POA: Diagnosis not present

## 2016-07-21 DIAGNOSIS — J309 Allergic rhinitis, unspecified: Secondary | ICD-10-CM | POA: Diagnosis not present

## 2016-07-21 MED ORDER — ALBUTEROL SULFATE HFA 108 (90 BASE) MCG/ACT IN AERS: 2.0000 | INHALATION_SPRAY | Freq: Four times a day (QID) | RESPIRATORY_TRACT | 5 refills | Status: DC | PRN

## 2016-07-21 MED ORDER — PREDNISONE 10 MG PO TABS
ORAL_TABLET | ORAL | 0 refills | Status: DC
Start: 2016-07-21 — End: 2016-07-27

## 2016-07-21 MED ORDER — TRIAMCINOLONE ACETONIDE 55 MCG/ACT NA AERO: 2.0000 | INHALATION_SPRAY | Freq: Every day | NASAL | 12 refills | Status: DC

## 2016-07-21 MED ORDER — METHYLPREDNISOLONE ACETATE 80 MG/ML IJ SUSP
80.0000 mg | Freq: Once | INTRAMUSCULAR | Status: AC
  Administered 2016-07-21: 80 mg via INTRAMUSCULAR

## 2016-07-21 NOTE — Progress Notes (Signed)
Subjective:    Patient ID: [name] [name], male    DOB: June 28, 1951, 65 y.o.   MRN: [id number]  HPI Here to f/u;  Does have several wks ongoing nasal allergy symptoms with clearish congestion, itch and sneezing, without fever, pain, ST, cough, better with flonase and hot compress over face, but still largely uncontrolled.  Has had some what sounds like bronchial wheezing on inspiration and some exp wheeze when forced as well, but also assoc with sob, worse at night, will occas wake him up.  Sometimes may feel cold, but  Pt denies fever, wt loss, night sweats, loss of appetite, or other constitutional symptoms  Has been worse with blowing the leaves in the yard as well, cold may make worse as well.  No overt productive cough.  Has hx of asthma and allergy s/p immunotherapy. Also has worsening irriatated area to left scalp, cant stop picking at it, but no pain or drainage.  Also has small slight raised fleshy colored skin lesion to right temple for several months Past Medical History:  Diagnosis Date  . Allergic rhinitis   . Asthma, mild   . Borderline diabetes mellitus   . Depression   . Diverticulosis of colon   . Generalized anxiety disorder   . GERD (gastroesophageal reflux disease)   . Hemorrhoids   . History of colonic polyps   . Hyperlipidemia   . Hypertension   . Lumbar back pain   . Obesity   . Venous insufficiency    Past Surgical History:  Procedure Laterality Date  . LUMBAR LAMINECTOMY  [id number]    reports that he has never smoked. He has never used smokeless tobacco. He reports that he does not drink alcohol or use drugs. family history includes Heart attack in his father; Lung cancer in his mother; Stroke in his father. No Known Allergies Current Outpatient Prescriptions on File Prior to Visit  Medication Sig Dispense Refill  . ALPRAZolam (XANAX) 0.5 MG tablet TAKE 1 TABLET THREE TIMES DAILY AS NEEDED FOR ANXIETY  OR  SLEEP 90 tablet 2  . amLODipine (NORVASC) 5 MG tablet  TAKE 1 TABLET DAILY 90 tablet 3  . aspirin 81 MG tablet Take 81 mg by mouth daily.      Marland Kitchen losartan (COZAAR) 50 MG tablet TAKE 1 TABLET DAILY 90 tablet 3  . omeprazole (PRILOSEC) 20 MG capsule Take 2 capsules (40 mg total) by mouth daily. 180 capsule 3  . traMADol (ULTRAM) 50 MG tablet Take 1 tablet (50 mg total) by mouth every 8 (eight) hours as needed. 270 tablet 1   No current facility-administered medications on file prior to visit.    Review of Systems  Constitutional: Negative for unusual diaphoresis or night sweats HENT: Negative for ear swelling or discharge Eyes: Negative for worsening visual haziness  Respiratory: Negative for choking and stridor.   Gastrointestinal: Negative for distension or worsening eructation Genitourinary: Negative for retention or change in urine volume.  Musculoskeletal: Negative for other MSK pain or swelling Skin: Negative for color change and worsening wound Neurological: Negative for tremors and numbness other than noted  Psychiatric/Behavioral: Negative for decreased concentration or agitation other than above   All other system neg per pt     Objective:   Physical Exam BP 138/78   Pulse 74   Temp 98.6 F (37 C) (Oral)   Resp 20   Wt 248 lb (112.5 kg)   SpO2 97%   BMI 31.84 kg/m  VS noted, not  ill appearing Constitutional: Pt appears in no apparent distress HENT: Head: NCAT.  Right Ear: External ear normal.  Left Ear: External ear normal.  Eyes: . Pupils are equal, round, and reactive to light. Conjunctivae and EOM are normal Bilat tm's with mild erythema.  Max sinus areas non tender.  Pharynx with mild erythema, no exudate Neck: Normal range of motion. Neck supple.  Cardiovascular: Normal rate and regular rhythm.   Pulmonary/Chest: Effort normal and breath sounds decreased without rales or wheezing.  Neurological: Pt is alert. Not confused , motor grossly intact Skin: Skin is warm. No rash, no LE edema, has scabby scaly rash to left  scalp above the ear aprox 1 cm area, NT without sweling, tender or drainage; also has single slight raised tan lesion to right temple about 8 mm without heaped edges or ulceration and is NT Psychiatric: Pt behavior is normal. No agitation.      Assessment & Plan:

## 2016-07-21 NOTE — Assessment & Plan Note (Signed)
stable overall by history and exam, recent data reviewed with pt, and pt to continue medical treatment as before,  to f/u any worsening symptoms or concerns BP Readings from Last 3 Encounters:  07/21/16 138/78  12/18/15 140/80  12/07/15 129/77

## 2016-07-21 NOTE — Assessment & Plan Note (Signed)
Mild to mod seasonal flare most likely, for depomedrol IM 80, predpac asd,   to f/u any worsening symptoms or concerns

## 2016-07-21 NOTE — Progress Notes (Signed)
Pre visit review using our clinic review tool, if applicable. No additional management support is needed unless otherwise documented below in the visit note. 

## 2016-07-21 NOTE — Assessment & Plan Note (Signed)
With scalp and right temple lesions, ok for refer to derm per pt request, states tx with several topical preps for seborrhea has not helped

## 2016-07-21 NOTE — Patient Instructions (Signed)
You had the steroid shot today  Please take all new medication as prescribed - the prednisone, and inhaler if needed  Please continue all other medications as before, and refills have been done if requested.  Please have the pharmacy call with any other refills you may need.  Please continue your efforts at being more active, low cholesterol diet, and weight control.  You will be contacted regarding the referral for: Dermatology  Please keep your appointments with your specialists as you may have planned  You can have the Hepatitis C testing done today if you like, otherwise it should be done at your next visit  Please return in 6 months, or sooner if needed, with Lab testing done 3-5 days before

## 2016-07-21 NOTE — Assessment & Plan Note (Signed)
stable overall by history and exam, recent data reviewed with pt, and pt to continue medical treatment as before,  to f/u any worsening symptoms or concerns Lab Results  Component Value Date   HGBA1C 5.5 12/07/2015   Pt to call for onset polys or cbg > 200

## 2016-07-21 NOTE — Assessment & Plan Note (Signed)
With mild recent flare by symtpoms, afeb with scant prod cough - for cxr per pt request, also albut MDI

## 2016-07-24 ENCOUNTER — Encounter: Payer: Self-pay | Admitting: Internal Medicine

## 2016-07-27 MED ORDER — ALBUTEROL SULFATE HFA 108 (90 BASE) MCG/ACT IN AERS
2.0000 | INHALATION_SPRAY | Freq: Four times a day (QID) | RESPIRATORY_TRACT | 5 refills | Status: DC | PRN
Start: 2016-07-27 — End: 2017-05-31

## 2016-07-27 MED ORDER — TRIAMCINOLONE ACETONIDE 55 MCG/ACT NA AERO: 2.0000 | INHALATION_SPRAY | Freq: Every day | NASAL | 12 refills | Status: DC

## 2016-07-27 MED ORDER — PREDNISONE 10 MG PO TABS: ORAL_TABLET | ORAL | 0 refills | Status: DC

## 2016-07-31 ENCOUNTER — Encounter: Payer: Self-pay | Admitting: Internal Medicine

## 2016-08-09 ENCOUNTER — Encounter: Payer: Self-pay | Admitting: Internal Medicine

## 2016-08-10 NOTE — Telephone Encounter (Signed)
Ok to forward to [facility] for notice

## 2016-08-14 ENCOUNTER — Telehealth: Payer: Self-pay | Admitting: Internal Medicine

## 2016-08-14 NOTE — Telephone Encounter (Signed)
Patient walked in to follow up on dermatology referral. He is wanting to go to [place]. Please follow up and notify patient. I am unable to see anything other than [facility] 07/31/2016 note./

## 2016-08-17 NOTE — Telephone Encounter (Signed)
Pt scheduled for Dr Allyson Sabal 09/01/16  @ 10:10AM [facility] D[facility] [place] [id number] [phone] Pt aware of appt date time and location

## 2016-09-01 DIAGNOSIS — L820 Inflamed seborrheic keratosis: Secondary | ICD-10-CM | POA: Diagnosis not present

## 2016-09-01 DIAGNOSIS — L219 Seborrheic dermatitis, unspecified: Secondary | ICD-10-CM | POA: Diagnosis not present

## 2016-09-03 ENCOUNTER — Telehealth: Payer: Self-pay | Admitting: Internal Medicine

## 2016-09-03 NOTE — Telephone Encounter (Signed)
Rec'd from [facility] forward 2 pages

## 2016-09-18 ENCOUNTER — Other Ambulatory Visit: Payer: Self-pay | Admitting: Internal Medicine

## 2016-09-21 ENCOUNTER — Encounter: Payer: Self-pay | Admitting: Internal Medicine

## 2016-09-22 NOTE — Telephone Encounter (Signed)
Done hardcopy to Corinne  

## 2016-09-22 NOTE — Telephone Encounter (Signed)
faxed

## 2016-10-08 ENCOUNTER — Telehealth: Payer: Self-pay | Admitting: *Deleted

## 2016-10-08 MED ORDER — ALPRAZOLAM 0.5 MG PO TABS: ORAL_TABLET | ORAL | 0 refills | Status: DC

## 2016-10-08 NOTE — Telephone Encounter (Signed)
Rec'd call pt requesting refill on his alprazolam. MD out of office pls advise...[name] [name]

## 2016-10-08 NOTE — Telephone Encounter (Signed)
[place] controlled substance database checked.  Ok to fill medication.  

## 2016-10-08 NOTE — Telephone Encounter (Signed)
Called refill into [facility] had to leave on pharmacy vm.../lmb 

## 2016-10-20 ENCOUNTER — Other Ambulatory Visit: Payer: Self-pay | Admitting: Internal Medicine

## 2016-10-20 ENCOUNTER — Encounter: Payer: Self-pay | Admitting: Internal Medicine

## 2016-10-22 ENCOUNTER — Telehealth: Payer: Self-pay | Admitting: Internal Medicine

## 2016-10-22 MED ORDER — TRAMADOL HCL 50 MG PO TABS: 50.0000 mg | ORAL_TABLET | Freq: Three times a day (TID) | ORAL | 5 refills | Status: DC | PRN

## 2016-10-22 NOTE — Telephone Encounter (Signed)
Done hardcopy to [facility] 

## 2016-10-22 NOTE — Telephone Encounter (Signed)
Patient gets tramadol through [facility].  Since January [employer] will only send patient a 30 day supply at a time.  On the last script written in January.  [facility] sent patient a 30 day supply (90 tabs) out on 1/26.  Patient has another 30 day supply coming from [facility].  Patient cancelled script with [facility] yesterday.  I called [employer] and verified this.  Patient did not want to get 30 day supply at once through mail order.   So patient still had one month left on script with 1 3 month refill when cancelled.  Patient is requesting new script that should start on 3/26 to be written and sent to [facility] at [facility].

## 2016-10-22 NOTE — Telephone Encounter (Signed)
Routing to dr john, please advise, thanks 

## 2016-10-23 NOTE — Telephone Encounter (Signed)
RX faxed to [employer] 

## 2016-11-03 ENCOUNTER — Other Ambulatory Visit: Payer: Self-pay | Admitting: Internal Medicine

## 2016-11-03 MED ORDER — ALPRAZOLAM 0.5 MG PO TABS: ORAL_TABLET | ORAL | 2 refills | Status: DC

## 2016-11-03 NOTE — Telephone Encounter (Signed)
Done hardcopy to [facility]  

## 2016-11-04 NOTE — Telephone Encounter (Signed)
Faxed script to [facility] [address]...Johny Chess

## 2016-11-23 DIAGNOSIS — L570 Actinic keratosis: Secondary | ICD-10-CM | POA: Diagnosis not present

## 2016-11-23 DIAGNOSIS — L080 Pyoderma: Secondary | ICD-10-CM | POA: Diagnosis not present

## 2016-11-23 DIAGNOSIS — L219 Seborrheic dermatitis, unspecified: Secondary | ICD-10-CM | POA: Diagnosis not present

## 2016-11-23 DIAGNOSIS — L720 Epidermal cyst: Secondary | ICD-10-CM | POA: Diagnosis not present

## 2017-01-06 ENCOUNTER — Encounter: Payer: Self-pay | Admitting: Nurse Practitioner

## 2017-01-06 ENCOUNTER — Other Ambulatory Visit: Payer: Self-pay | Admitting: Internal Medicine

## 2017-01-06 ENCOUNTER — Other Ambulatory Visit (INDEPENDENT_AMBULATORY_CARE_PROVIDER_SITE_OTHER): Payer: Medicare HMO

## 2017-01-06 ENCOUNTER — Encounter: Payer: Self-pay | Admitting: Internal Medicine

## 2017-01-06 ENCOUNTER — Telehealth: Payer: Self-pay

## 2017-01-06 ENCOUNTER — Ambulatory Visit (INDEPENDENT_AMBULATORY_CARE_PROVIDER_SITE_OTHER): Payer: Medicare HMO | Admitting: Internal Medicine

## 2017-01-06 VITALS — BP 106/70 | HR 71 | Ht 74.0 in | Wt 248.0 lb

## 2017-01-06 DIAGNOSIS — R7303 Prediabetes: Secondary | ICD-10-CM

## 2017-01-06 DIAGNOSIS — Z1159 Encounter for screening for other viral diseases: Secondary | ICD-10-CM

## 2017-01-06 DIAGNOSIS — Z23 Encounter for immunization: Secondary | ICD-10-CM

## 2017-01-06 DIAGNOSIS — D751 Secondary polycythemia: Secondary | ICD-10-CM

## 2017-01-06 DIAGNOSIS — I10 Essential (primary) hypertension: Secondary | ICD-10-CM | POA: Diagnosis not present

## 2017-01-06 DIAGNOSIS — Z114 Encounter for screening for human immunodeficiency virus [HIV]: Secondary | ICD-10-CM

## 2017-01-06 DIAGNOSIS — E785 Hyperlipidemia, unspecified: Secondary | ICD-10-CM

## 2017-01-06 DIAGNOSIS — R7989 Other specified abnormal findings of blood chemistry: Secondary | ICD-10-CM

## 2017-01-06 DIAGNOSIS — Z0001 Encounter for general adult medical examination with abnormal findings: Secondary | ICD-10-CM

## 2017-01-06 DIAGNOSIS — R1011 Right upper quadrant pain: Secondary | ICD-10-CM

## 2017-01-06 LAB — BASIC METABOLIC PANEL
BUN: 15 mg/dL (ref 6–23)
CALCIUM: 9.8 mg/dL (ref 8.4–10.5)
CHLORIDE: 102 meq/L (ref 96–112)
CO2: 26 meq/L (ref 19–32)
CREATININE: 1.24 mg/dL (ref 0.40–1.50)
GFR: 62.14 mL/min (ref >60.00)
Glucose, Bld: 143 mg/dL — ABNORMAL HIGH (ref 70–99)
Potassium: 4.3 mEq/L (ref 3.5–5.1)
SODIUM: 140 meq/L (ref 135–145)

## 2017-01-06 LAB — HEPATIC FUNCTION PANEL
ALK PHOS: 79 U/L (ref 39–117)
ALT: 21 U/L (ref 0–53)
AST: 15 U/L (ref 0–37)
Albumin: 4.5 g/dL (ref 3.5–5.2)
BILIRUBIN DIRECT: 0.1 mg/dL (ref 0.0–0.3)
BILIRUBIN TOTAL: 0.6 mg/dL (ref 0.2–1.2)
TOTAL PROTEIN: 6.9 g/dL (ref 6.0–8.3)

## 2017-01-06 LAB — TSH: TSH: 0.87 u[IU]/mL (ref 0.35–4.50)

## 2017-01-06 LAB — CBC WITH DIFFERENTIAL/PLATELET
BASOS ABS: 0.1 10*3/uL (ref 0.0–0.1)
BASOS PCT: 0.6 % (ref 0.0–3.0)
EOS ABS: 0.3 10*3/uL (ref 0.0–0.7)
Eosinophils Relative: 3.1 % (ref 0.0–5.0)
HEMATOCRIT: 53.3 % — AB (ref 39.0–52.0)
LYMPHS PCT: 23.1 % (ref 12.0–46.0)
Lymphs Abs: 2.3 10*3/uL (ref 0.7–4.0)
MCHC: 34 g/dL (ref 30.0–36.0)
MCV: 90.3 fl (ref 78.0–100.0)
MONOS PCT: 8.2 % (ref 3.0–12.0)
Monocytes Absolute: 0.8 10*3/uL (ref 0.1–1.0)
Neutro Abs: 6.5 10*3/uL (ref 1.4–7.7)
Neutrophils Relative %: 65 % (ref 43.0–77.0)
Platelets: 294 10*3/uL (ref 150.0–400.0)
RBC: 5.9 Mil/uL — ABNORMAL HIGH (ref 4.22–5.81)
RDW: 12.8 % (ref 11.5–15.5)
WBC: 10 10*3/uL (ref 4.0–10.5)

## 2017-01-06 LAB — LIPID PANEL
CHOL/HDL RATIO: 4
Cholesterol: 148 mg/dL (ref 0–200)
HDL: 35.4 mg/dL — ABNORMAL LOW (ref >39.00)
NONHDL: 112.56
TRIGLYCERIDES: 301 mg/dL — AB (ref 0.0–149.0)
VLDL: 60.2 mg/dL — ABNORMAL HIGH (ref 0.0–40.0)

## 2017-01-06 LAB — URINALYSIS, ROUTINE W REFLEX MICROSCOPIC
HGB URINE DIPSTICK: NEGATIVE
KETONES UR: NEGATIVE
Leukocytes, UA: NEGATIVE
NITRITE: NEGATIVE
Specific Gravity, Urine: 1.025 (ref 1.000–1.030)
Total Protein, Urine: NEGATIVE
URINE GLUCOSE: NEGATIVE
UROBILINOGEN UA: 0.2 (ref 0.0–1.0)
pH: 5.5 (ref 5.0–8.0)

## 2017-01-06 LAB — LDL CHOLESTEROL, DIRECT: Direct LDL: 75 mg/dL

## 2017-01-06 LAB — PSA: PSA: 0.98 ng/mL (ref 0.10–4.00)

## 2017-01-06 LAB — HEMOGLOBIN A1C: Hgb A1c MFr Bld: 6.1 % (ref 4.6–6.5)

## 2017-01-06 MED ORDER — ALPRAZOLAM 0.5 MG PO TABS: ORAL_TABLET | ORAL | 2 refills | Status: DC

## 2017-01-06 MED ORDER — PANTOPRAZOLE SODIUM 40 MG PO TBEC: 40.0000 mg | DELAYED_RELEASE_TABLET | Freq: Every day | ORAL | 3 refills | Status: DC

## 2017-01-06 NOTE — Patient Instructions (Addendum)
You had the prevnar 13 pneumonia shot today  OK to stop the prilosec (omeprazole)  Please take all new medication as prescribed - the protonix  Please continue all other medications as before, and refills have been done if requested.  Please have the pharmacy call with any other refills you may need.  Please continue your efforts at being more active, low cholesterol diet, and weight control.  You are otherwise up to date with prevention measures today.  You will be contacted regarding the referral for: Ultrasound, and Dr Fuller Plan appeointment  Please keep your appointments with your specialists as you may have planned  Please go to the LAB in the Basement (turn left off the elevator) for the tests to be done today  You will be contacted by phone if any changes need to be made immediately.  Otherwise, you will receive a letter about your results with an explanation, but please check with MyChart first.  Please remember to sign up for MyChart if you have not done so, as this will be important to you in the future with finding out test results, communicating by private email, and scheduling acute appointments online when needed.  Please return in 1 year for your yearly visit, or sooner if needed  .

## 2017-01-06 NOTE — Telephone Encounter (Signed)
CRITICAL VALUE STICKER  CRITICAL VALUE:18.1 hemoglobin  RECEIVER (on-site recipient of call):[name] [name]  DATE & TIME NOTIFIED: [id number] 11:44am  MESSENGER (representative from lab):karen/lab  MD NOTIFIED: dr [name]  TIME OF NOTIFICATION:[id number] 11:44  RESPONSE: pending

## 2017-01-06 NOTE — Progress Notes (Signed)
Subjective:    Patient ID: [name] [name], male    DOB: 1951-03-19, 66 y.o.   MRN: [id number]  HPI  Here for wellness and f/u;  Overall doing ok;  Pt denies Chest pain, worsening SOB, DOE, wheezing, orthopnea, PND, worsening LE edema, palpitations, dizziness or syncope.  Pt denies neurological change such as new headache, facial or extremity weakness.  Pt denies polydipsia, polyuria, or low sugar symptoms. Pt states overall good compliance with treatment and medications, good tolerability, and has been trying to follow appropriate diet.  Pt denies worsening depressive symptoms, suicidal ideation or panic. No fever, night sweats, wt loss, loss of appetite, or other constitutional symptoms.  Pt states good ability with ADL's, has low fall risk, home safety reviewed and adequate, no other significant changes in hearing or vision, and only occasionally active with exercise. Also has had "indigestion" abd pain x 2-3 days despite less carbonation drinks, also constipation with bloating  requiring senakot several times per wk, assoc with bloating, long hard stools without blood.  Misse last colonoscpy for f/u colon polyp at 6 yrs from 2011. Havin varaiable dite abd pain, seems worst at hte RUQ, then RLA and left side as well. Past Medical History:  Diagnosis Date  . Allergic rhinitis   . Asthma, mild   . Borderline diabetes mellitus   . Depression   . Diverticulosis of colon   . Generalized anxiety disorder   . GERD (gastroesophageal reflux disease)   . Hemorrhoids   . History of colonic polyps   . Hyperlipidemia   . Hypertension   . Lumbar back pain   . Obesity   . Venous insufficiency    Past Surgical History:  Procedure Laterality Date  . LUMBAR LAMINECTOMY  [id number]    reports that he has never smoked. He has never used smokeless tobacco. He reports that he does not drink alcohol or use drugs. family history includes Heart attack in his father; Lung cancer in his mother; Stroke in his  father. No Known Allergies Current Outpatient Prescriptions on File Prior to Visit  Medication Sig Dispense Refill  . albuterol (PROVENTIL HFA;VENTOLIN HFA) 108 (90 Base) MCG/ACT inhaler Inhale 2 puffs into the lungs every 6 (six) hours as needed for wheezing or shortness of breath. 1 Inhaler 5  . amLODipine (NORVASC) 5 MG tablet TAKE 1 TABLET DAILY 90 tablet 3  . aspirin 81 MG tablet Take 81 mg by mouth daily.      Marland Kitchen losartan (COZAAR) 50 MG tablet Take 1 tablet (50 mg total) by mouth daily. Yearly physical w/labs due in April must see MD for refills 90 tablet 0  . traMADol (ULTRAM) 50 MG tablet Take 1 tablet (50 mg total) by mouth every 8 (eight) hours as needed. To fill Nov 23, 2016 90 tablet 5  . triamcinolone (NASACORT AQ) 55 MCG/ACT AERO nasal inhaler Place 2 sprays into the nose daily. 1 Inhaler 12   No current facility-administered medications on file prior to visit.    Review of Systems Constitutional: Negative for other unusual diaphoresis, sweats, appetite or weight changes HENT: Negative for other worsening hearing loss, ear pain, facial swelling, mouth sores or neck stiffness.   Eyes: Negative for other worsening pain, redness or other visual disturbance.  Respiratory: Negative for other stridor or swelling Cardiovascular: Negative for other palpitations or other chest pain  Gastrointestinal: Negative for worsening diarrhea or loose stools, blood in stool, distention or other pain Genitourinary: Negative for hematuria, flank pain or  other change in urine volume.  Musculoskeletal: Negative for myalgias or other joint swelling.  Skin: Negative for other color change, or other wound or worsening drainage.  Neurological: Negative for other syncope or numbness. Hematological: Negative for other adenopathy or swelling Psychiatric/Behavioral: Negative for hallucinations, other worsening agitation, SI, self-injury, or new decreased concentration All other system neg per pt      Objective:   Physical Exam BP 106/70   Pulse 71   Ht 6\' 2"  (1.88 m)   Wt 248 lb (112.5 kg)   SpO2 99%   BMI 31.84 kg/m  VS noted,  Constitutional: Pt is oriented to person, place, and time. Appears well-developed and well-nourished, in no significant distress and comfortable Head: Normocephalic and atraumatic  Eyes: Conjunctivae and EOM are normal. Pupils are equal, round, and reactive to light Right Ear: External ear normal without discharge Left Ear: External ear normal without discharge Nose: Nose without discharge or deformity Mouth/Throat: Oropharynx is without other ulcerations and moist  Neck: Normal range of motion. Neck supple. No JVD present. No tracheal deviation present or significant neck LA or mass Cardiovascular: Normal rate, regular rhythm, normal heart sounds and intact distal pulses.   Pulmonary/Chest: WOB normal and breath sounds without rales or wheezing  Abdominal: Soft. Bowel sounds are normal. NT. No HSM but mild RUQ tender Musculoskeletal: Normal range of motion. Exhibits no edema Lymphadenopathy: Has no other cervical adenopathy.  Neurological: Pt is alert and oriented to person, place, and time. Pt has normal reflexes. No cranial nerve deficit. Motor grossly intact, Gait intact Skin: Skin is warm and dry. No rash noted or new ulcerations Psychiatric:  Has normal mood and affect. Behavior is normal without agitation No oither exam findgins    Assessment & Plan:

## 2017-01-07 ENCOUNTER — Telehealth: Payer: Self-pay

## 2017-01-07 LAB — HIV ANTIBODY (ROUTINE TESTING W REFLEX): HIV 1&2 Ab, 4th Generation: NONREACTIVE

## 2017-01-07 NOTE — Telephone Encounter (Signed)
Informed pt of results and patient expressed understanding.

## 2017-01-07 NOTE — Telephone Encounter (Signed)
-----   Message from Biagio Borg, MD sent at 01/06/2017  8:45 PM EDT ----- Left message on MyChart, pt to cont same tx except  The test results show that your current treatment is OK, except the hemoglobin is more elevated and now very concerning.  The reason for this is not clear, and I would like to refer you to Hematology for further consideration.  You should hear from the office as well.    Claris Guymon to please inform pt, I will do referral

## 2017-01-08 ENCOUNTER — Ambulatory Visit
Admission: RE | Admit: 2017-01-08 | Discharge: 2017-01-08 | Disposition: A | Discharge status: Home / Self Care | Payer: Medicare HMO | Source: Ambulatory Visit | Attending: Internal Medicine | Admitting: Internal Medicine

## 2017-01-08 DIAGNOSIS — R1011 Right upper quadrant pain: Secondary | ICD-10-CM

## 2017-01-08 DIAGNOSIS — K8080 Other cholelithiasis without obstruction: Secondary | ICD-10-CM | POA: Diagnosis not present

## 2017-01-10 NOTE — Assessment & Plan Note (Signed)
stable overall by history and exam, recent data reviewed with pt, and pt to continue medical treatment as before,  to f/u any worsening symptoms or concerns BP Readings from Last 3 Encounters:  01/06/17 106/70  07/21/16 138/78  12/18/15 140/80

## 2017-01-10 NOTE — Assessment & Plan Note (Signed)

## 2017-01-10 NOTE — Assessment & Plan Note (Addendum)
?   Clinical significance, for abd u/s,.  to f/u any worsening symptoms or concerns  In addition to the time spent performing CPE, I spent an additional 15 minutes face to face,in which greater than 50% of this time was spent in counseling and coordination of care for patient's illness as documented, incluing the differential dx, evaluation and tx for RUQ pain, HTN, and mild elevated glucose, and HLD

## 2017-01-10 NOTE — Assessment & Plan Note (Signed)
stable overall by history and exam, recent data reviewed with pt, and pt to continue medical treatment as before,  to f/u any worsening symptoms or concerns Lab Results  Component Value Date   LDLCALC 55 12/07/2015

## 2017-01-10 NOTE — Assessment & Plan Note (Signed)
Lab Results  Component Value Date   HGBA1C 6.1 01/06/2017   stable overall by history and exam, recent data reviewed with pt, and pt to continue medical treatment as before,  to f/u any worsening symptoms or concerns

## 2017-01-19 ENCOUNTER — Ambulatory Visit: Payer: Medicare HMO | Admitting: Nurse Practitioner

## 2017-01-27 ENCOUNTER — Other Ambulatory Visit: Payer: Self-pay | Admitting: Internal Medicine

## 2017-01-31 ENCOUNTER — Encounter: Payer: Self-pay | Admitting: Internal Medicine

## 2017-02-02 NOTE — Telephone Encounter (Signed)
Ok for [name] [name] to confirm regarding refills left on his xanax, pt received total 3 mo at last rx may 9 by emr

## 2017-02-02 NOTE — Telephone Encounter (Signed)
shirron to phone pharmacy to check on refills on file

## 2017-02-04 ENCOUNTER — Telehealth: Payer: Self-pay | Admitting: Internal Medicine

## 2017-02-04 NOTE — Telephone Encounter (Signed)
I agree with his approach for getting the colonoscopy done  I would not know what to say about his abd pain without a better exam. Consider ROV, or consider OV tomorrow with another provider or Sat AM clinic

## 2017-02-04 NOTE — Telephone Encounter (Signed)
Called pt, LVM.   

## 2017-02-04 NOTE — Telephone Encounter (Signed)
Patient calling back states he missed a phone call.    Would like to know if it came from [place].  Told patient it could have been emmi call.    Patient states he would like a call back in regard to stomach issues he was having.  Patient states he has forgot but has been diagnosed with diverticulitis in the past. Would like to know if this could be causing pain?  Patient states he is due for colonoscopy and is currently trying to set this up with Dr. Fuller Plan once he can get a relative to agree to take him.  States father and moms father both had colon cancer. Patient did states he would look up symptoms on web MD.  Please follow up with patient in regard.

## 2017-02-04 NOTE — Telephone Encounter (Signed)
Dr. Cato Mulligan called this patient today however could you please advise on his so I could address those concerns when I give him a call back? Thanks!

## 2017-02-19 ENCOUNTER — Encounter: Payer: Self-pay | Admitting: Internal Medicine

## 2017-02-25 ENCOUNTER — Encounter: Payer: Self-pay | Admitting: Hematology

## 2017-02-25 ENCOUNTER — Telehealth: Payer: Self-pay | Admitting: Hematology

## 2017-02-25 NOTE — Telephone Encounter (Signed)
Appt has been scheduled for the pt to see Dr. Irene Limbo on 7/24 at 310pm. Pt aware to arrive 30 minutes early. Demographics verified. Letter mailed.

## 2017-03-10 ENCOUNTER — Ambulatory Visit (INDEPENDENT_AMBULATORY_CARE_PROVIDER_SITE_OTHER): Payer: Medicare HMO | Admitting: Nurse Practitioner

## 2017-03-10 ENCOUNTER — Encounter: Payer: Self-pay | Admitting: Nurse Practitioner

## 2017-03-10 VITALS — BP 124/80 | HR 68 | Ht 74.0 in | Wt 250.0 lb

## 2017-03-10 DIAGNOSIS — K8081 Other cholelithiasis with obstruction: Secondary | ICD-10-CM | POA: Diagnosis not present

## 2017-03-10 DIAGNOSIS — R109 Unspecified abdominal pain: Secondary | ICD-10-CM

## 2017-03-10 DIAGNOSIS — K5900 Constipation, unspecified: Secondary | ICD-10-CM

## 2017-03-10 DIAGNOSIS — Z800 Family history of malignant neoplasm of digestive organs: Secondary | ICD-10-CM | POA: Diagnosis not present

## 2017-03-10 MED ORDER — NA SULFATE-K SULFATE-MG SULF 17.5-3.13-1.6 GM/177ML PO SOLN: ORAL | 0 refills | Status: DC

## 2017-03-10 NOTE — Progress Notes (Signed)
HPI: "[name] [name]" is a 66 yo male known to Dr. Fuller Plan. He has a [facility] of colon cancer in his father. Patient is about 2 years overdue for his screening colonoscopy. He is referred by PCP, Dr. [name], for evaluation of upper abdominal pain which started two months ago. History is a little hard to obtain as patient seems to have difficulty giving direct answers without excessive detail. The pain was initially in epigastrium but now migrates to LUQ and he also has pain in RLQ. The pain doesn't seem to have any relationship to food. He has chronic back pain so unsure if there is any radiation of pain into his back. Feels like his organs have hardened / enlarged. He has been having significant constipation, sometimes has to digitally extract stool himself. May labs unrevealing except for Hgb of 18. U/A unremarkable. His weight is up 15 pounds since April. 2017. Recent u/s >>> cholelithiasis   Past Medical History:  Diagnosis Date  . Allergic rhinitis   . Asthma, mild   . Borderline diabetes mellitus   . Depression   . Diverticulosis of colon   . Generalized anxiety disorder   . GERD (gastroesophageal reflux disease)   . Hemorrhoids   . History of colonic polyps   . Hyperlipidemia   . Hypertension   . Lumbar back pain   . Obesity   . Venous insufficiency      Past Surgical History:  Procedure Laterality Date  . LUMBAR LAMINECTOMY  2542,7062   Family History  Problem Relation Age of Onset  . Stroke Father   . Heart attack Father   . Lung cancer Mother    Social History  Substance Use Topics  . Smoking status: Never Smoker  . Smokeless tobacco: Never Used  . Alcohol use No   Current Outpatient Prescriptions  Medication Sig Dispense Refill  . albuterol (PROVENTIL HFA;VENTOLIN HFA) 108 (90 Base) MCG/ACT inhaler Inhale 2 puffs into the lungs every 6 (six) hours as needed for wheezing or shortness of breath. 1 Inhaler 5  . ALPRAZolam (XANAX) 0.5 MG tablet TAKE 1 TABLET THREE TIMES DAILY  AS NEEDED FOR ANXIETY  OR  SLEEP 90 tablet 2  . amLODipine (NORVASC) 5 MG tablet TAKE 1 TABLET DAILY 90 tablet 3  . aspirin 81 MG tablet Take 81 mg by mouth daily.      Marland Kitchen losartan (COZAAR) 50 MG tablet Take 1 tablet (50 mg total) by mouth daily. 90 tablet 3  . omeprazole (PRILOSEC) 40 MG capsule Take 40 mg by mouth daily.    . traMADol (ULTRAM) 50 MG tablet Take 1 tablet (50 mg total) by mouth every 8 (eight) hours as needed. To fill Nov 23, 2016 90 tablet 5  . triamcinolone (NASACORT AQ) 55 MCG/ACT AERO nasal inhaler Place 2 sprays into the nose daily. 1 Inhaler 12   No current facility-administered medications for this visit.    No Known Allergies   Review of Systems: All systems reviewed and negative except where noted in HPI.    Physical Exam: BP 124/80   Pulse 68   Ht 6\' 2"  (1.88 m)   Wt 250 lb (113.4 kg)   BMI 32.10 kg/m  Constitutional:  Well-developed, white male in no acute distress. Psychiatric: Normal mood and affect. Marland Kitchen EENT: Pupils normal.  Conjunctivae are normal. No scleral icterus. Neck supple.  Cardiovascular: Normal rate, regular rhythm. No edema Pulmonary/chest: Effort normal and breath sounds normal. No wheezing, rales or rhonchi. Abdominal:  Soft, obese. Nontender. Bowel sounds active throughout. There are no masses palpable.  Lymphadenopathy: No cervical adenopathy noted. Neurological: Alert and oriented to person place and time. Skin: Skin is warm and dry. No rashes noted.   ASSESSMENT AND PLAN:  1. 66 yo male with generalized abdominal pain, predominantly epig / LUQ. On chronic PPI. No weight loss, labs unremarkable. Gallstones on u/s but doubt source of his pain. He complains of significant constipation, feels like "organs are hard and enlarged".  -We need to purge bowels then have patient reassess his pain. Recommended Miralax TID over next 3 days then call us with update. Will need further evaluation if pain persists after resolution of constipation .    2. [facility] of colon cancer in primary relative. He is overdue for screening colonoscopy.  Patient will be scheduled for a screening colonoscopy with possible polypectomy.  The risks and benefits of the procedure were discussed and the patient agrees to proceed.    Tye Savoy, NP  03/10/2017, 10:30 AM  Cc:  Biagio Borg, MD

## 2017-03-10 NOTE — Patient Instructions (Addendum)
If you are age 65 or older, your body mass index should be between 23-30. Your Body mass index is 32.1 kg/m. If this is out of the aforementioned range listed, please consider follow up with your Primary Care Provider.  If you are age 8 or younger, your body mass index should be between 19-25. Your Body mass index is 32.1 kg/m. If this is out of the aformentioned range listed, please consider follow up with your Primary Care Provider.   You have been scheduled for a colonoscopy. Please follow written instructions given to you at your visit today.  Please pick up your prep supplies at the pharmacy within the next 1-3 days. If you use inhalers (even only as needed), please bring them with you on the day of your procedure. Your physician has requested that you go to www.startemmi.com and enter the access code given to you at your visit today. This web site gives a general overview about your procedure. However, you should still follow specific instructions given to you by our office regarding your preparation for the procedure.  Take Miralax one glass three times daily for the next three days.  HOLD for severe diarrhea (more than 3 loose stools every day)  Saturday start Citrucel as directed every day.  Call with an update.  Thank you for choosing me and [facility].   Tye Savoy, NP

## 2017-03-15 NOTE — Progress Notes (Signed)
Reviewed and agree with initial management plan.  Thaddeaus Monica T. Taray Normoyle, MD FACG 

## 2017-03-19 ENCOUNTER — Telehealth: Payer: Self-pay | Admitting: Nurse Practitioner

## 2017-03-19 NOTE — Telephone Encounter (Signed)
FYI:  [name] [name], The pt is calling to give an update; he says the hardness of his abdomen is better but he does have some nausea and not much of an appetite.  He did have a good BM.  He thinks the heat may be causing some of the nausea and loss of appetite.

## 2017-03-23 ENCOUNTER — Telehealth: Payer: Self-pay | Admitting: Hematology

## 2017-03-23 ENCOUNTER — Ambulatory Visit (HOSPITAL_BASED_OUTPATIENT_CLINIC_OR_DEPARTMENT_OTHER): Payer: Medicare HMO | Admitting: Hematology

## 2017-03-23 ENCOUNTER — Encounter: Payer: Self-pay | Admitting: Hematology

## 2017-03-23 VITALS — BP 117/76 | HR 70 | Temp 98.5°F | Resp 20 | Ht 74.0 in | Wt 254.0 lb

## 2017-03-23 DIAGNOSIS — R7989 Other specified abnormal findings of blood chemistry: Secondary | ICD-10-CM

## 2017-03-23 DIAGNOSIS — D751 Secondary polycythemia: Secondary | ICD-10-CM | POA: Diagnosis not present

## 2017-03-23 DIAGNOSIS — I10 Essential (primary) hypertension: Secondary | ICD-10-CM

## 2017-03-23 DIAGNOSIS — D45 Polycythemia vera: Secondary | ICD-10-CM

## 2017-03-23 NOTE — Progress Notes (Signed)
HEMATOLOGY/ONCOLOGY CONSULTATION NOTE  Date of Service: 03/23/2017  Patient Care Team: Biagio Borg, MD as PCP - General (Internal Medicine)  CHIEF COMPLAINTS/PURPOSE OF CONSULTATION:  Elevated ferritin levels Polycythemia  HISTORY OF PRESENTING ILLNESS:   [name] [name] is a wonderful 66 y.o. male who has been referred to [place] by Dr .Biagio Borg, MD  for evaluation and management of polycythemia and Elevated ferritin levels.  Patient has a history of hypertension, dyslipidemia, borderline diabetes, asthma, GERD, obesity who on labs on 01/06/2017 with his primary care physician was noted to have an elevated hemoglobin of 18.1 and a hematocrit of 53.3 with a normal platelet count of 294k and a WBC count of 10k.  He was also seen by Dr. Fuller Plan with GI and had a ferritin level drawn as a part of workup for generalized abdominal pain. Ferritin level was noted to be elevated in the 600 range.  He is here for further evaluation and management of his polycythemia and elevated ferritin. Patient notes no family history of hemachromatosis.  No prior history of venous thromboembolism. No fevers no chills no night sweats no weight loss no new bone pains. No headaches no focal neurological deficits no dizziness no vision changes. He has previously had an ultrasound of the abdomen on 01/08/2017 that showed a normal size spleen, cholelithiasis without acute cholecystitis and abdominal appearance of the liver consistent with hepatic steatosis.  Patient reports no family history of iron overload issues. Notes some baseline mild fatigue. No unexpected weight loss. Patient notes that he has not been drinking as much water and believes that he was likely dehydrated on the day that his labs showed polycythemia.  MEDICAL HISTORY:  Past Medical History:  Diagnosis Date  . Allergic rhinitis   . Asthma, mild   . Borderline diabetes mellitus   . Depression   . Diverticulosis of colon   . Gallstones     . Generalized anxiety disorder   . GERD (gastroesophageal reflux disease)   . Hemorrhoids   . History of colonic polyps   . Hyperlipidemia   . Hypertension   . Lumbar back pain   . Obesity   . Venous insufficiency     SURGICAL HISTORY: Past Surgical History:  Procedure Laterality Date  . LUMBAR FUSION    . LUMBAR LAMINECTOMY  [id number]    SOCIAL HISTORY: Social History   Social History  . Marital status: Single    Spouse name: N/A  . Number of children: N/A  . Years of education: N/A   Occupational History  . warehouse work Teacher, early years/pre  . disability     since 10/2003 due to his back   Social History Main Topics  . Smoking status: Never Smoker  . Smokeless tobacco: Never Used  . Alcohol use No  . Drug use: No  . Sexual activity: Not on file   Other Topics Concern  . Not on file   Social History Narrative  . No narrative on file    FAMILY HISTORY: Family History  Problem Relation Age of Onset  . Stroke Father   . Heart attack Father   . Lung cancer Mother     ALLERGIES:  has No Known Allergies.  MEDICATIONS:  Current Outpatient Prescriptions  Medication Sig Dispense Refill  . albuterol (PROVENTIL HFA;VENTOLIN HFA) 108 (90 Base) MCG/ACT inhaler Inhale 2 puffs into the lungs every 6 (six) hours as needed for wheezing or shortness of breath.  1 Inhaler 5  . ALPRAZolam (XANAX) 0.5 MG tablet TAKE 1 TABLET THREE TIMES DAILY AS NEEDED FOR ANXIETY  OR  SLEEP 90 tablet 2  . amLODipine (NORVASC) 5 MG tablet TAKE 1 TABLET DAILY 90 tablet 3  . aspirin 81 MG tablet Take 81 mg by mouth daily.      Marland Kitchen losartan (COZAAR) 50 MG tablet Take 1 tablet (50 mg total) by mouth daily. 90 tablet 3  . Na Sulfate-K Sulfate-Mg Sulf 17.5-3.13-1.6 GM/180ML SOLN Suprep-Use as directed 354 mL 0  . omeprazole (PRILOSEC) 40 MG capsule Take 40 mg by mouth daily.    . traMADol (ULTRAM) 50 MG tablet Take 1 tablet (50 mg total) by mouth every 8 (eight) hours as needed.  To fill Nov 23, 2016 90 tablet 5  . triamcinolone (NASACORT AQ) 55 MCG/ACT AERO nasal inhaler Place 2 sprays into the nose daily. 1 Inhaler 12   No current facility-administered medications for this visit.     REVIEW OF SYSTEMS:    10 Point review of Systems was done is negative except as noted above.  PHYSICAL EXAMINATION: ECOG PERFORMANCE STATUS: 1 - Symptomatic but completely ambulatory  . Vitals:   03/23/17 1447  BP: 117/76  Pulse: 70  Resp: 20  Temp: 98.5 F (36.9 C)   Filed Weights   03/23/17 1447  Weight: 254 lb (115.2 kg)   .Body mass index is 32.61 kg/m.  GENERAL:alert, in no acute distress and comfortable SKIN: no acute rashes, no significant lesions EYES: conjunctiva are pink and non-injected, sclera anicteric OROPHARYNX: MMM, no exudates, no oropharyngeal erythema or ulceration NECK: supple, no JVD LYMPH:  no palpable lymphadenopathy in the cervical, axillary or inguinal regions LUNGS: clear to auscultation b/l with normal respiratory effort HEART: regular rate & rhythm ABDOMEN:  Obese, normoactive bowel sounds , non tender, not distended. No palpable hepatosplenomegaly Extremity: no pedal edema PSYCH: alert & oriented x 3 with fluent speech NEURO: no focal motor/sensory deficits  LABORATORY DATA:  I have reviewed the data as listed  . CBC Latest Ref Rng & Units 01/06/2017 12/06/2015 01/10/2015  WBC 4.0 - 10.5 K/uL 10.0 9.5 7.5  Hemoglobin 13.0 - 17.0 g/dL 18.1 Repeated and verified X2.(HH) 17.0 16.8  Hematocrit 39.0 - 52.0 % 53.3(H) 46.7 48.8  Platelets 150.0 - 400.0 K/uL 294.0 272 263.0    . CMP Latest Ref Rng & Units 01/06/2017 12/18/2015 12/06/2015  Glucose 70 - 99 mg/dL 143(H) 93 152(H)  BUN 6 - 23 mg/dL 15 8 11   Creatinine 0.40 - 1.50 mg/dL 1.24 0.97 1.08  Sodium 135 - 145 mEq/L 140 141 142  Potassium 3.5 - 5.1 mEq/L 4.3 4.8 3.6  Chloride 96 - 112 mEq/L 102 104 107  CO2 19 - 32 mEq/L 26 27 23   Calcium 8.4 - 10.5 mg/dL 9.8 9.6 9.1  Total Protein  6.0 - 8.3 g/dL 6.9 - -  Total Bilirubin 0.2 - 1.2 mg/dL 0.6 - -  Alkaline Phos 39 - 117 U/L 79 - -  AST 0 - 37 U/L 15 - -  ALT 0 - 53 U/L 21 - -     RADIOGRAPHIC STUDIES: I have personally reviewed the radiological images as listed and agreed with the findings in the report. No results found.  ASSESSMENT & PLAN:   66 year old Caucasian male with  #1 Polycythemia. Patient with hemoglobin of 18.1 with hematocrit of 53.3. No associated leukocytosis or thrombocytosis. No overt splenomegaly. Likely secondary. ? Dehydration. Rule out sleep apnea. Plan -Repeat  CBC with differential and reticulocyte count today. -Jak2 V617F and Jak2 Exon 12 mutation studies to rule out polycythemia vera. -Further management and consideration for therapeutic phlebotomy based on repeat labs and whether this is primary or secondary polycythemia.  -erythropoeitin level  #2 Elevated ferritin levels. Ferritin level of 632. Rule out hemochromatosis. Patient does not report any family history of hemochromatosis, early liver cirrhosis or sudden cardiac death. Plan -HFE gene panel to check for hereditary hemachromatosis.   . Orders Placed This Encounter  Procedures  . CBC & Diff and Retic    Standing Status:   Future    Number of Occurrences:   1    Standing Expiration Date:   03/23/2018  . Comprehensive metabolic panel    Standing Status:   Future    Number of Occurrences:   1    Standing Expiration Date:   03/23/2018  . JAK-2 Exon 12 (only if JAK2-V617F neg)    Standing Status:   Future    Number of Occurrences:   1    Standing Expiration Date:   03/23/2018  . JAK2 genotypr    This lab is sent out to [facility]    Standing Status:   Future    Number of Occurrences:   1    Standing Expiration Date:   03/23/2018  . Erythropoietin    Standing Status:   Future    Number of Occurrences:   1    Standing Expiration Date:   03/23/2018  . Ferritin    Standing Status:   Future    Number of Occurrences:    1    Standing Expiration Date:   03/23/2018    All of the patients questions were answered with apparent satisfaction. The patient knows to call the clinic with any problems, questions or concerns.  I spent 40 minutes counseling the patient face to face. The total time spent in the appointment was 60 minutes and more than 50% was on counseling and direct patient cares.    Sullivan Lone MD [place] AAHIVMS [facility] Hematology/Oncology Physician [facility]  (Office):       [phone] (Work cell):  [phone] (Fax):           [phone]  03/23/2017 3:53 PM

## 2017-03-23 NOTE — Telephone Encounter (Signed)
Ok it sounds like moving bowels helped.. Daily citrucel is fine. He can take Miralax once daily  as needed,. As far as the nausea and decreased appetite, this needs to be addressed if not improving and if losing weight. He is scheduled for a colonoscopy but need to hear from him before then if upper symptoms are persisting.

## 2017-03-23 NOTE — Telephone Encounter (Signed)
The pt is aware and will call back if nausea and vomiting continue.

## 2017-03-23 NOTE — Telephone Encounter (Signed)
Scheduled appt per 7/24 los - Gave patient AVS and calender - labs tomorrow - RTC on as needed basis.

## 2017-03-23 NOTE — Telephone Encounter (Signed)
Left message on machine to call back  

## 2017-03-24 ENCOUNTER — Other Ambulatory Visit (HOSPITAL_BASED_OUTPATIENT_CLINIC_OR_DEPARTMENT_OTHER): Payer: Medicare HMO

## 2017-03-24 DIAGNOSIS — D45 Polycythemia vera: Secondary | ICD-10-CM

## 2017-03-24 LAB — COMPREHENSIVE METABOLIC PANEL
ALT: 26 U/L (ref 0–55)
ANION GAP: 9 meq/L (ref 3–11)
AST: 16 U/L (ref 5–34)
Albumin: 3.6 g/dL (ref 3.5–5.0)
Alkaline Phosphatase: 67 U/L (ref 40–150)
BILIRUBIN TOTAL: 0.88 mg/dL (ref 0.20–1.20)
BUN: 10.9 mg/dL (ref 7.0–26.0)
CALCIUM: 9.1 mg/dL (ref 8.4–10.4)
CHLORIDE: 104 meq/L (ref 98–109)
CO2: 27 mEq/L (ref 22–29)
CREATININE: 1 mg/dL (ref 0.7–1.3)
EGFR: 80 mL/min/{1.73_m2} — AB (ref >90)
Glucose: 154 mg/dl — ABNORMAL HIGH (ref 70–140)
Potassium: 4 mEq/L (ref 3.5–5.1)
Sodium: 140 mEq/L (ref 136–145)
Total Protein: 6.5 g/dL (ref 6.4–8.3)

## 2017-03-24 LAB — CBC & DIFF AND RETIC
BASO%: 0.3 % (ref 0.0–2.0)
BASOS ABS: 0 10*3/uL (ref 0.0–0.1)
EOS%: 3.2 % (ref 0.0–7.0)
Eosinophils Absolute: 0.2 10*3/uL (ref 0.0–0.5)
HEMATOCRIT: 46.2 % (ref 38.4–49.9)
HGB: 16.4 g/dL (ref 13.0–17.1)
Immature Retic Fract: 1.8 % — ABNORMAL LOW (ref 3.00–10.60)
LYMPH%: 19.5 % (ref 14.0–49.0)
MCH: 31.7 pg (ref 27.2–33.4)
MCHC: 35.5 g/dL (ref 32.0–36.0)
MCV: 89.2 fL (ref 79.3–98.0)
MONO#: 0.6 10*3/uL (ref 0.1–0.9)
MONO%: 8.8 % (ref 0.0–14.0)
NEUT#: 4.9 10*3/uL (ref 1.5–6.5)
NEUT%: 68.2 % (ref 39.0–75.0)
PLATELETS: 214 10*3/uL (ref 140–400)
RBC: 5.18 10*6/uL (ref 4.20–5.82)
RDW: 12.8 % (ref 11.0–14.6)
RETIC CT ABS: 74.07 10*3/uL (ref 34.80–93.90)
Retic %: 1.43 % (ref 0.80–1.80)
WBC: 7.1 10*3/uL (ref 4.0–10.3)
lymph#: 1.4 10*3/uL (ref 0.9–3.3)

## 2017-03-24 LAB — FERRITIN: FERRITIN: 632 ng/mL — AB (ref 22–316)

## 2017-03-25 LAB — ERYTHROPOIETIN: Erythropoietin: 5.7 m[IU]/mL (ref 2.6–18.5)

## 2017-03-26 IMAGING — CR DG CHEST 2V
2 series · 2 of 2 positions shown · non-contrast
Comparison: 01/09/2013

CLINICAL DATA: Patient c/o chest pain to mid chest to mid abd
today. Patient states he has had some trouble breathing through his
Patient reports he has had this same chest pain previously diagnosed
with anxiety.

EXAM:
CHEST  2 VIEW

[w chest pa]
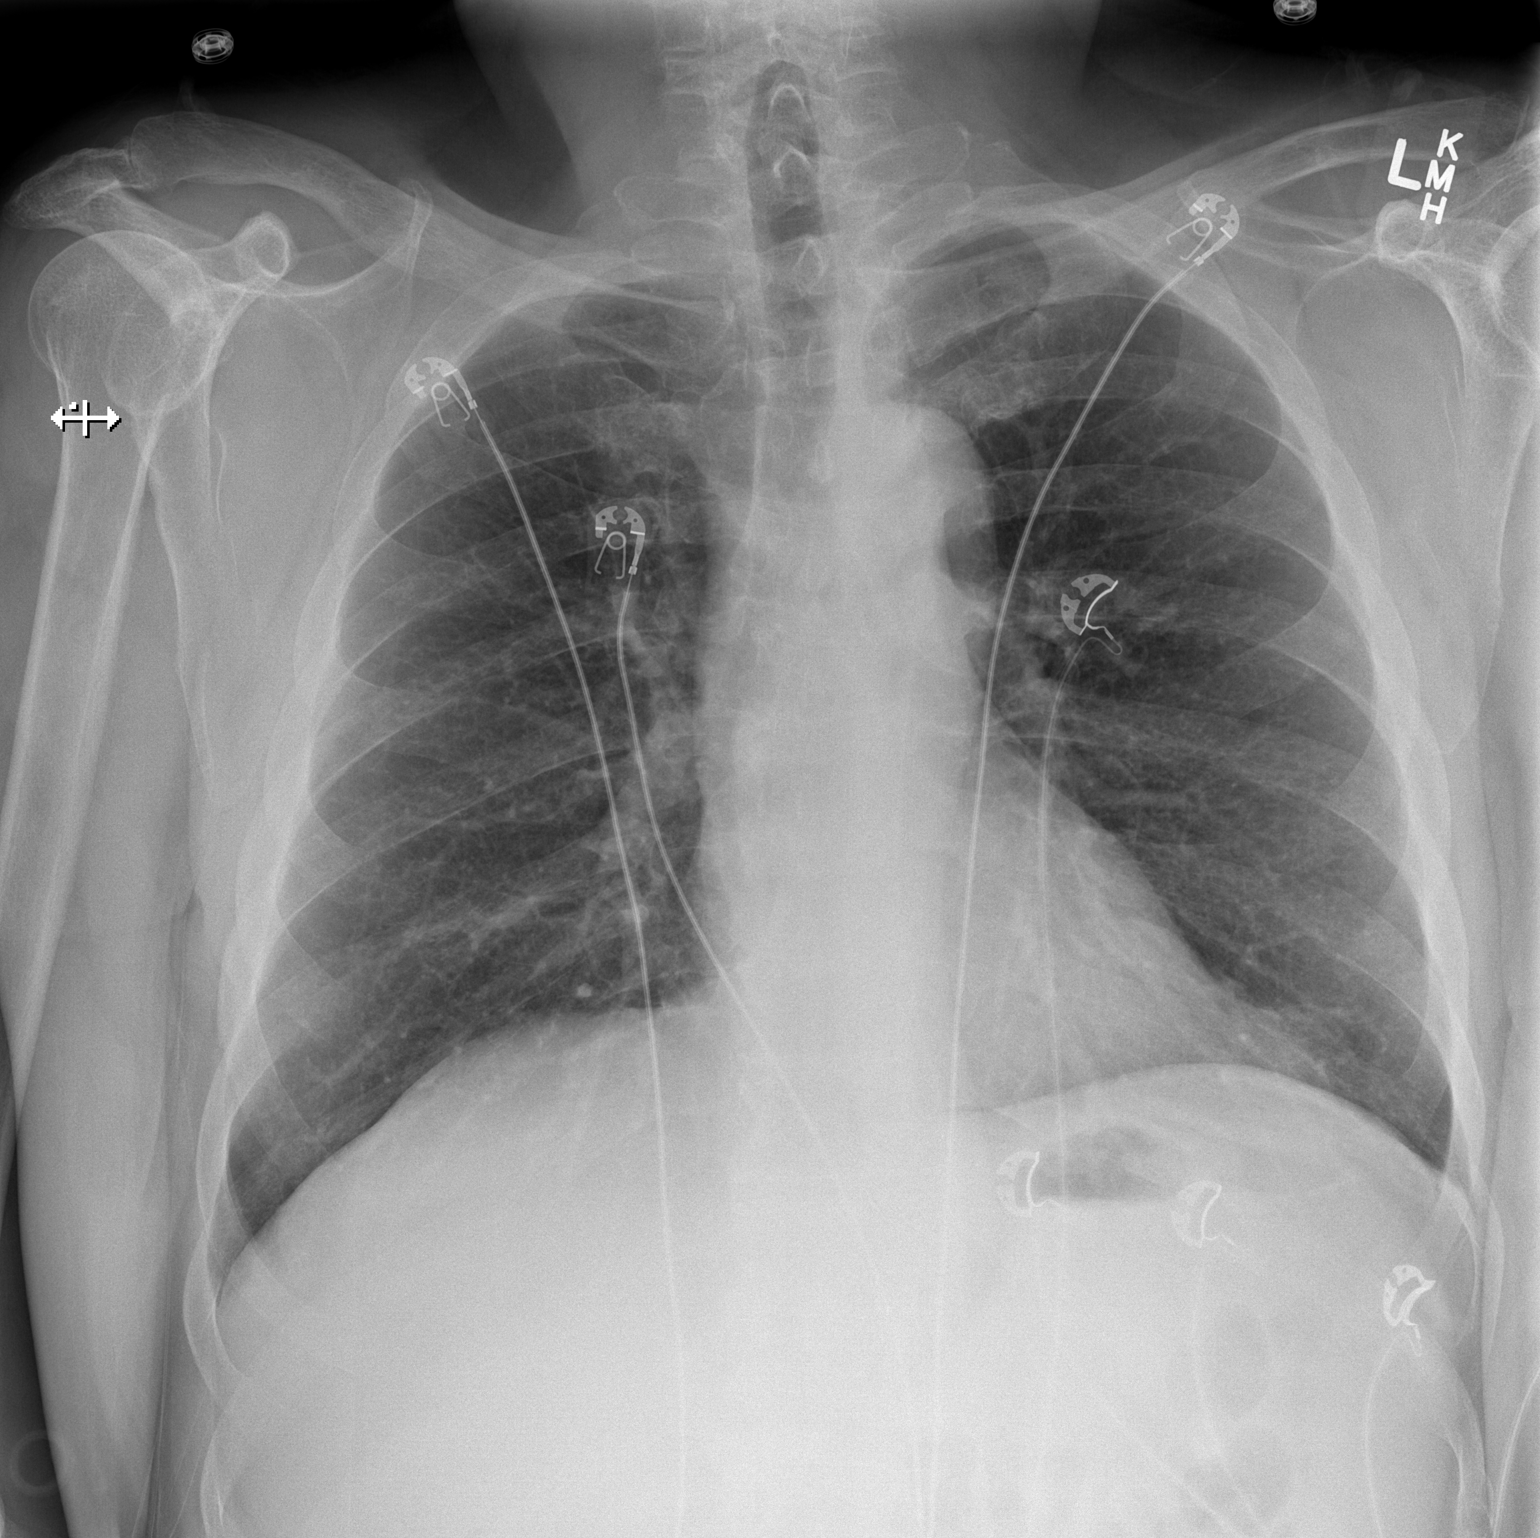

[w chest lat]
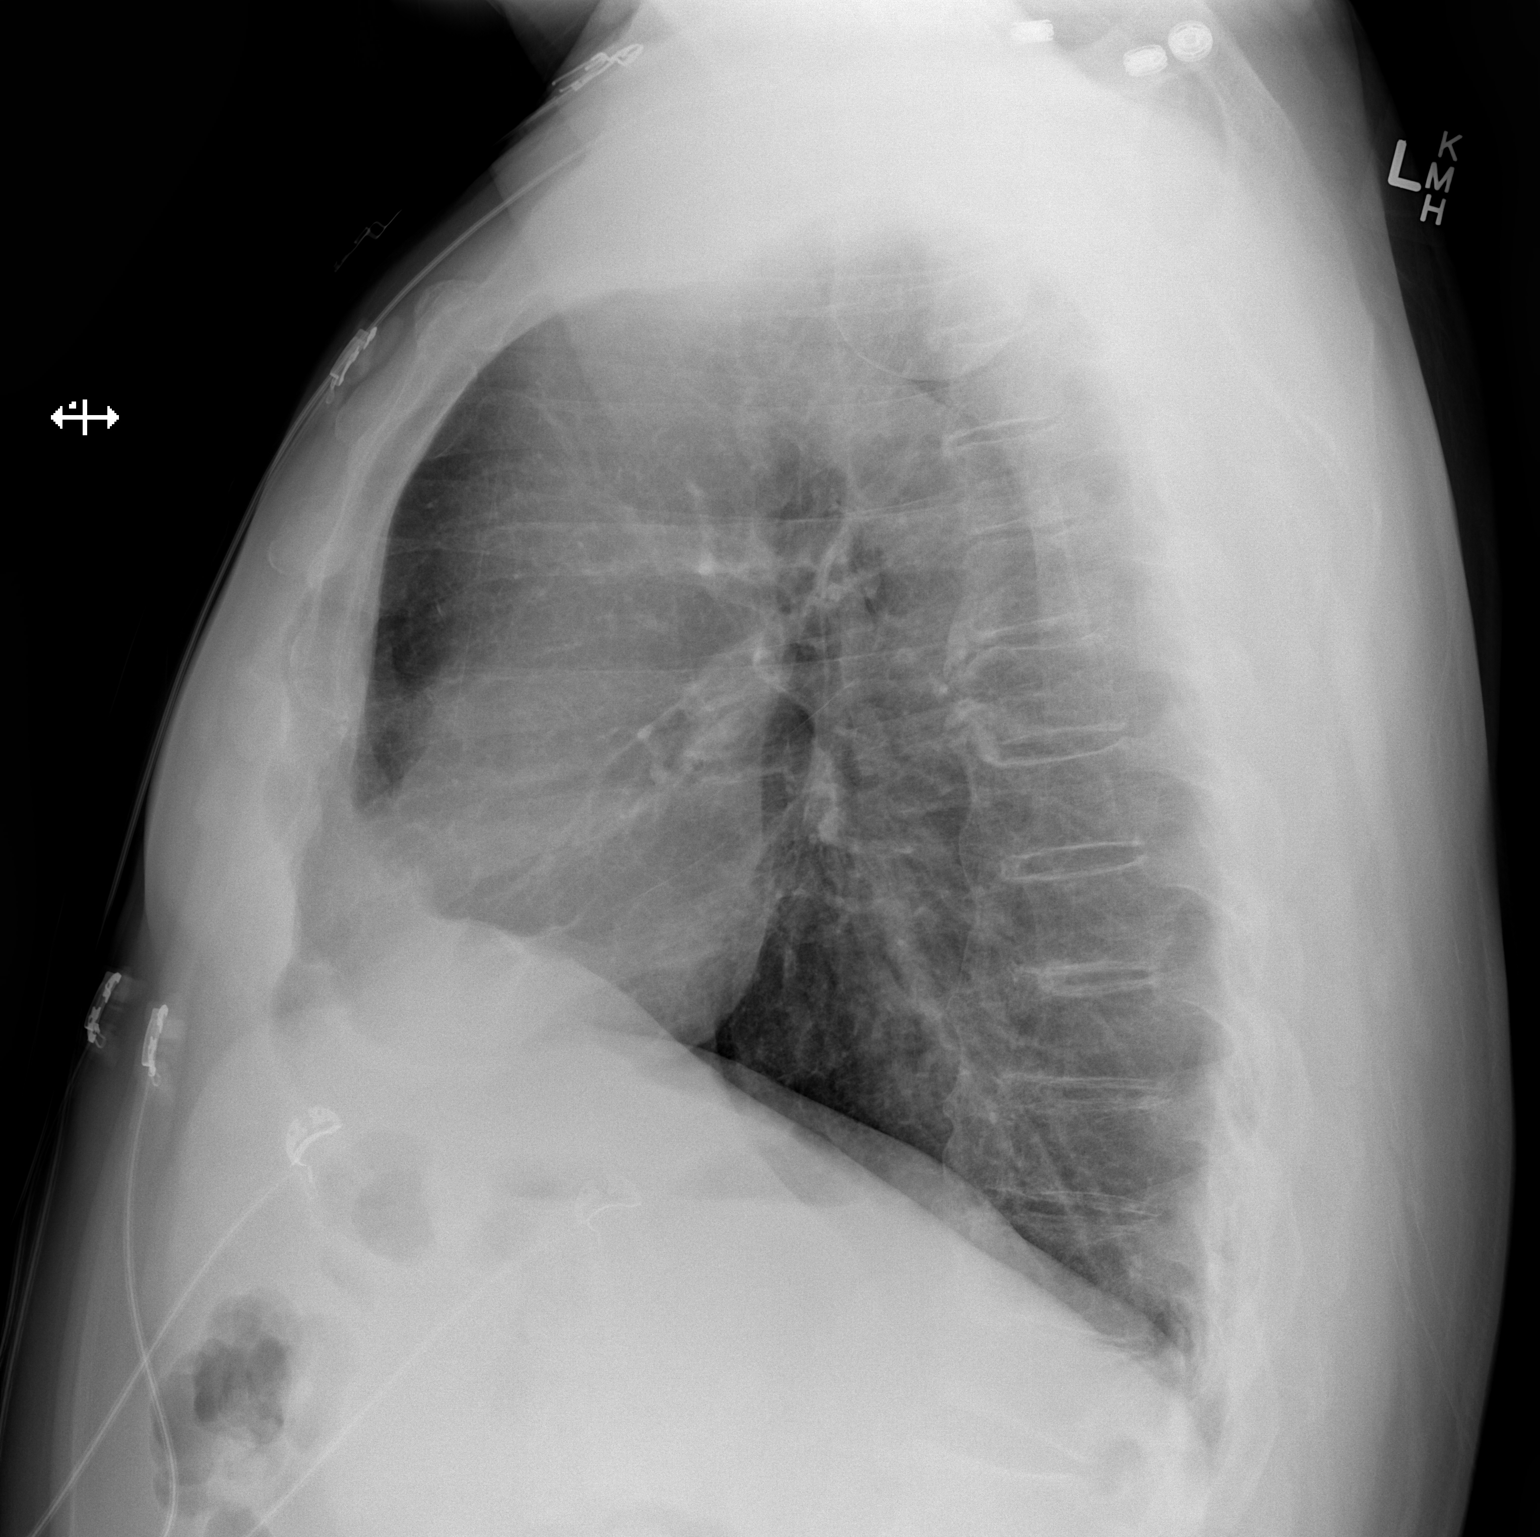

[2 of 2 positions shown; findings below may reference images not displayed]

FINDINGS: Cardiac silhouette is normal in size and configuration. Normal
mediastinal and hilar contours.

Clear lungs.  No pleural effusion or pneumothorax.

Bony thorax is intact.
IMPRESSION: No active cardiopulmonary disease.

## 2017-03-29 ENCOUNTER — Other Ambulatory Visit: Payer: Self-pay | Admitting: Internal Medicine

## 2017-03-29 ENCOUNTER — Telehealth: Payer: Self-pay | Admitting: Internal Medicine

## 2017-03-29 NOTE — Telephone Encounter (Signed)
Patient is also requesting amlodipine.  States he only has 6 days left and needs to go to mail order.

## 2017-03-29 NOTE — Telephone Encounter (Signed)
Patient would like to know lab results ran by cancer center.  Patient did not know if these results would be coming back to Dr. [name] to inform or if Dr. Irene Limbo would be contacting him.  I have informed patient that results have not been released yet.  But patient would like to know which office would be contacting him.  I have tried contacting the cancer center but they are closed.  Patient is unsure of what the cancer center told him.  Did not see anything on AVS from cancer center.  Please advise.

## 2017-03-30 ENCOUNTER — Other Ambulatory Visit: Payer: Self-pay | Admitting: Hematology

## 2017-03-30 ENCOUNTER — Telehealth: Payer: Self-pay

## 2017-03-30 DIAGNOSIS — R7989 Other specified abnormal findings of blood chemistry: Secondary | ICD-10-CM

## 2017-03-30 NOTE — Telephone Encounter (Signed)
Called pt inform him he will need to contact his oncologist for the results since he was the MD that ordered the labs he would be the one who give results. Pt stated that he saw them on the mychart sxs last night & was concern about the Ferritin level. Again inform pt to go ahead and contact Dr. Irene Limbo office for his recommendation.../lm,b

## 2017-03-30 NOTE — Telephone Encounter (Signed)
Pt concerned about fatty live and what that would mean for his longevity. Ferritin level 632. "Does Dr. Irene Limbo want me to start getting blood taken?" Pt had discussed potential of phlebotomy with Dr. Irene Limbo at visit. Will discuss with Dr. Irene Limbo and call pt back.

## 2017-03-31 ENCOUNTER — Telehealth: Payer: Self-pay

## 2017-03-31 NOTE — Telephone Encounter (Signed)
Spoke with patient about fatty liver and elevated ferritin. Per Dr. Irene Limbo, plan to have labs drawn and f/u appt one week post. Pt scheduled for both. Advised to decrease intake of red meat, stop taking iron and drinking alcohol, and to not use iron skillets. Regarding lifestyle modifications for fatty liver, pt to discuss with PCP. Pt verbalized understanding. Anxious to find out results. Has done lots of online research and thinks that if he has hemachromatosis he may only have a year to live. Told him we would get the labs and the doctor would discuss concerns and results with him at his visit. Nothing diagnosed at this time.

## 2017-04-01 ENCOUNTER — Other Ambulatory Visit (HOSPITAL_BASED_OUTPATIENT_CLINIC_OR_DEPARTMENT_OTHER): Payer: Medicare HMO

## 2017-04-01 DIAGNOSIS — R7989 Other specified abnormal findings of blood chemistry: Secondary | ICD-10-CM

## 2017-04-01 DIAGNOSIS — D45 Polycythemia vera: Secondary | ICD-10-CM | POA: Diagnosis not present

## 2017-04-01 LAB — COMPREHENSIVE METABOLIC PANEL
ALT: 24 U/L (ref 0–55)
ANION GAP: 7 meq/L (ref 3–11)
AST: 18 U/L (ref 5–34)
Albumin: 3.7 g/dL (ref 3.5–5.0)
Alkaline Phosphatase: 68 U/L (ref 40–150)
BUN: 10.4 mg/dL (ref 7.0–26.0)
CALCIUM: 9.3 mg/dL (ref 8.4–10.4)
CO2: 28 mEq/L (ref 22–29)
Chloride: 105 mEq/L (ref 98–109)
Creatinine: 1 mg/dL (ref 0.7–1.3)
EGFR: 80 mL/min/{1.73_m2} — AB (ref >90)
Glucose: 123 mg/dl (ref 70–140)
POTASSIUM: 4 meq/L (ref 3.5–5.1)
Sodium: 140 mEq/L (ref 136–145)
Total Bilirubin: 0.84 mg/dL (ref 0.20–1.20)
Total Protein: 6.8 g/dL (ref 6.4–8.3)

## 2017-04-01 LAB — CBC & DIFF AND RETIC
BASO%: 0.4 % (ref 0.0–2.0)
BASOS ABS: 0 10*3/uL (ref 0.0–0.1)
EOS%: 3.7 % (ref 0.0–7.0)
Eosinophils Absolute: 0.3 10*3/uL (ref 0.0–0.5)
HEMATOCRIT: 47.7 % (ref 38.4–49.9)
HGB: 16.7 g/dL (ref 13.0–17.1)
Immature Retic Fract: 2.2 % — ABNORMAL LOW (ref 3.00–10.60)
LYMPH%: 25.1 % (ref 14.0–49.0)
MCH: 31.6 pg (ref 27.2–33.4)
MCHC: 35 g/dL (ref 32.0–36.0)
MCV: 90.2 fL (ref 79.3–98.0)
MONO#: 0.8 10*3/uL (ref 0.1–0.9)
MONO%: 9.6 % (ref 0.0–14.0)
NEUT#: 4.8 10*3/uL (ref 1.5–6.5)
NEUT%: 61.2 % (ref 39.0–75.0)
PLATELETS: 231 10*3/uL (ref 140–400)
RBC: 5.29 10*6/uL (ref 4.20–5.82)
RDW: 12.6 % (ref 11.0–14.6)
RETIC %: 1.57 % (ref 0.80–1.80)
Retic Ct Abs: 83.05 10*3/uL (ref 34.80–93.90)
WBC: 7.8 10*3/uL (ref 4.0–10.3)
lymph#: 2 10*3/uL (ref 0.9–3.3)

## 2017-04-01 LAB — IRON AND TIBC
%SAT: 52 % (ref 20–55)
IRON: 150 ug/dL (ref 42–163)
TIBC: 291 ug/dL (ref 202–409)
UIBC: 141 ug/dL (ref 117–376)

## 2017-04-01 LAB — FERRITIN: FERRITIN: 675 ng/mL — AB (ref 22–316)

## 2017-04-05 LAB — HEMOCHROMATOSIS DNA-PCR(C282Y,H63D)

## 2017-04-08 ENCOUNTER — Encounter: Payer: Self-pay | Admitting: Hematology

## 2017-04-08 ENCOUNTER — Ambulatory Visit (HOSPITAL_BASED_OUTPATIENT_CLINIC_OR_DEPARTMENT_OTHER): Payer: Medicare HMO | Admitting: Hematology

## 2017-04-08 VITALS — BP 122/79 | HR 66 | Temp 98.8°F | Resp 16 | Wt 253.2 lb

## 2017-04-08 DIAGNOSIS — R7989 Other specified abnormal findings of blood chemistry: Secondary | ICD-10-CM

## 2017-04-08 DIAGNOSIS — D751 Secondary polycythemia: Secondary | ICD-10-CM | POA: Diagnosis not present

## 2017-04-08 NOTE — Patient Instructions (Signed)
Thank you for choosing [facility] to provide your oncology and hematology care.  To afford each patient quality time with our providers, please arrive 30 minutes before your scheduled appointment time.  If you arrive late for your appointment, you may be asked to reschedule.  We strive to give you quality time with our providers, and arriving late affects you and other patients whose appointments are after yours.   If you are a no show for multiple scheduled visits, you may be dismissed from the clinic at the providers discretion.    Again, thank you for choosing [facility], our hope is that these requests will decrease the amount of time that you wait before being seen by our physicians.  ______________________________________________________________________  Should you have questions after your visit to the  Cancer Center, please contact our office at [phone] between the hours of 8:30 and 4:30 p.m.    Voicemails left after 4:30p.m will not be returned until the following business day.    For prescription refill requests, please have your pharmacy contact us directly.  Please also try to allow 48 hours for prescription requests.    Please contact the scheduling department for questions regarding scheduling.  For scheduling of procedures such as PET scans, CT scans, MRI, Ultrasound, etc please contact central scheduling at [phone].    Resources For Cancer Patients and Caregivers:   Oncolink.org:  A wonderful resource for patients and healthcare providers for information regarding your disease, ways to tract your treatment, what to expect, etc.     American Cancer Society:  800-227-2345  Can help patients locate various types of support and financial assistance  Cancer Care: 1-800-813-HOPE (4673) Provides financial assistance, online support groups, medication/co-pay assistance.    [place] DSS:  336-641-3447 Where to apply for food  stamps, Medicaid, and utility assistance  Medicare Rights Center: 800-333-4114 Helps people with Medicare understand their rights and benefits, navigate the Medicare system, and secure the quality healthcare they deserve  SCAT: 336-333-6589 [place] [employer]'s shared-ride transportation service for eligible riders who have a disability that prevents them from riding the fixed route bus.    For additional information on assistance programs please contact our social worker:   Grier Hock/Abigail Elmore:  [phone]            

## 2017-04-08 NOTE — Progress Notes (Signed)
HEMATOLOGY/ONCOLOGY CLINIC NOTE  Date of Service: 04/08/2017  Patient Care Team: Biagio Borg, MD as PCP - General (Internal Medicine)  CHIEF COMPLAINTS/PURPOSE OF CONSULTATION:  Elevated ferritin levels Polycythemia  HISTORY OF PRESENTING ILLNESS:   [name] [name] is a wonderful 66 y.o. male who has been referred to [place] by Dr .Biagio Borg, MD  for evaluation and management of polycythemia and Elevated ferritin levels.  Patient has a history of hypertension, dyslipidemia, borderline diabetes, asthma, GERD, obesity who on labs on 01/06/2017 with his primary care physician was noted to have an elevated hemoglobin of 18.1 and a hematocrit of 53.3 with a normal platelet count of 294k and a WBC count of 10k.  He was also seen by Dr. Fuller Plan with GI and had a ferritin level drawn as a part of workup for generalized abdominal pain. Ferritin level was noted to be elevated in the 600 range.  He is here for further evaluation and management of his polycythemia and elevated ferritin. Patient notes no family history of hemachromatosis.  No prior history of venous thromboembolism. No fevers no chills no night sweats no weight loss no new bone pains. No headaches no focal neurological deficits no dizziness no vision changes. He has previously had an ultrasound of the abdomen on 01/08/2017 that showed a normal size spleen, cholelithiasis without acute cholecystitis and abdominal appearance of the liver consistent with hepatic steatosis.  Patient reports no family history of iron overload issues. Notes some baseline mild fatigue. No unexpected weight loss. Patient notes that he has not been drinking as much water and believes that he was likely dehydrated on the day that his labs showed polycythemia.  INTERVAL HISTORY  Patient is here for follow-up of his polycythemia and elevated ferritin workup. His repeat labs showed normal hemoglobin of 16.7 with hematocrit of 47 and normal WBC count and  platelets. His HFE gene mutation studies were negative with no clear evidence of hereditary hemachromatosis. All the results were discussed in detail with the patient.  MEDICAL HISTORY:  Past Medical History:  Diagnosis Date  . Allergic rhinitis   . Asthma, mild   . Borderline diabetes mellitus   . Depression   . Diverticulosis of colon   . Gallstones   . Generalized anxiety disorder   . GERD (gastroesophageal reflux disease)   . Hemorrhoids   . History of colonic polyps   . Hyperlipidemia   . Hypertension   . Lumbar back pain   . Obesity   . Venous insufficiency     SURGICAL HISTORY: Past Surgical History:  Procedure Laterality Date  . LUMBAR FUSION    . LUMBAR LAMINECTOMY  [id number]    SOCIAL HISTORY: Social History   Social History  . Marital status: Single    Spouse name: N/A  . Number of children: N/A  . Years of education: N/A   Occupational History  . warehouse work Teacher, early years/pre  . disability     since 10/2003 due to his back   Social History Main Topics  . Smoking status: Never Smoker  . Smokeless tobacco: Never Used  . Alcohol use No  . Drug use: No  . Sexual activity: Not on file   Other Topics Concern  . Not on file   Social History Narrative  . No narrative on file    FAMILY HISTORY: Family History  Problem Relation Age of Onset  . Stroke Father   . Heart attack  Father   . Lung cancer Mother     ALLERGIES:  has No Known Allergies.  MEDICATIONS:  Current Outpatient Prescriptions  Medication Sig Dispense Refill  . albuterol (PROVENTIL HFA;VENTOLIN HFA) 108 (90 Base) MCG/ACT inhaler Inhale 2 puffs into the lungs every 6 (six) hours as needed for wheezing or shortness of breath. 1 Inhaler 5  . ALPRAZolam (XANAX) 0.5 MG tablet TAKE 1 TABLET THREE TIMES DAILY AS NEEDED FOR ANXIETY  OR  SLEEP 90 tablet 2  . amLODipine (NORVASC) 5 MG tablet TAKE 1 TABLET EVERY DAY 90 tablet 3  . aspirin 81 MG tablet Take 81 mg by  mouth daily.      Marland Kitchen losartan (COZAAR) 50 MG tablet Take 1 tablet (50 mg total) by mouth daily. 90 tablet 3  . Na Sulfate-K Sulfate-Mg Sulf 17.5-3.13-1.6 GM/180ML SOLN Suprep-Use as directed 354 mL 0  . omeprazole (PRILOSEC) 40 MG capsule Take 40 mg by mouth daily.    . traMADol (ULTRAM) 50 MG tablet Take 1 tablet (50 mg total) by mouth every 8 (eight) hours as needed. To fill Nov 23, 2016 90 tablet 5  . triamcinolone (NASACORT AQ) 55 MCG/ACT AERO nasal inhaler Place 2 sprays into the nose daily. 1 Inhaler 12   No current facility-administered medications for this visit.     REVIEW OF SYSTEMS:    10 Point review of Systems was done is negative except as noted above.  PHYSICAL EXAMINATION: ECOG PERFORMANCE STATUS: 1 - Symptomatic but completely ambulatory  . Vitals:   04/08/17 1045  BP: 122/79  Pulse: 66  Resp: 16  Temp: 98.8 F (37.1 C)  SpO2: 97%   Filed Weights   04/08/17 1045  Weight: 253 lb 3.2 oz (114.9 kg)   .Body mass index is 32.51 kg/m.  GENERAL:alert, in no acute distress and comfortable SKIN: no acute rashes, no significant lesions EYES: conjunctiva are pink and non-injected, sclera anicteric OROPHARYNX: MMM, no exudates, no oropharyngeal erythema or ulceration NECK: supple, no JVD LYMPH:  no palpable lymphadenopathy in the cervical, axillary or inguinal regions LUNGS: clear to auscultation b/l with normal respiratory effort HEART: regular rate & rhythm ABDOMEN:  Obese, normoactive bowel sounds , non tender, not distended. No palpable hepatosplenomegaly Extremity: no pedal edema PSYCH: alert & oriented x 3 with fluent speech NEURO: no focal motor/sensory deficits  LABORATORY DATA:  I have reviewed the data as listed  . CBC Latest Ref Rng & Units 04/01/2017 03/24/2017 01/06/2017  WBC 4.0 - 10.3 10e3/uL 7.8 7.1 10.0  Hemoglobin 13.0 - 17.1 g/dL 16.7 16.4 18.1 Repeated and verified X2.(HH)  Hematocrit 38.4 - 49.9 % 47.7 46.2 53.3(H)  Platelets 140 - 400  10e3/uL 231 214 294.0    . CMP Latest Ref Rng & Units 04/01/2017 03/24/2017 01/06/2017  Glucose 70 - 140 mg/dl 123 154(H) 143(H)  BUN 7.0 - 26.0 mg/dL 10.4 10.9 15  Creatinine 0.7 - 1.3 mg/dL 1.0 1.0 1.24  Sodium 136 - 145 mEq/L 140 140 140  Potassium 3.5 - 5.1 mEq/L 4.0 4.0 4.3  Chloride 96 - 112 mEq/L - - 102  CO2 22 - 29 mEq/L 28 27 26   Calcium 8.4 - 10.4 mg/dL 9.3 9.1 9.8  Total Protein 6.4 - 8.3 g/dL 6.8 6.5 6.9  Total Bilirubin 0.20 - 1.20 mg/dL 0.84 0.88 0.6  Alkaline Phos 40 - 150 U/L 68 67 79  AST 5 - 34 U/L 18 16 15   ALT 0 - 55 U/L 24 26 21    Component  Latest Ref Rng & Units 03/24/2017 04/01/2017  Iron     42 - 163 ug/dL  150  TIBC     202 - 409 ug/dL  291  UIBC     117 - 376 ug/dL  141  %SAT     20 - 55 %  52  Erythropoietin     2.6 - 18.5 mIU/mL 5.7   Ferritin     22 - 316 ng/ml  675 (H)        Hemochromatosis DNA-PCR(c282y,h63d)  Order: [id number]  Status:  Final result Visible to patient:  No (Not Released) Next appt:  05/13/2017 at 11:00 AM in [facility] T. Fuller Plan, MD) Dx:  Elevated ferritin  Component 7d ago  Hemochromatosis Gene Comment   Comment: NO MUTATION IDENTIFIED         RADIOGRAPHIC STUDIES: I have personally reviewed the radiological images as listed and agreed with the findings in the report. No results found.  ASSESSMENT & PLAN:   66 year old Caucasian male with  #1 Polycythemia. Patient with hemoglobin of 18.1 with hematocrit of 53.3. Repeat labs showed resolution of polycythemia suggesting that this could've been related to hemoconcentration from dehydration. Jak2 V617F and Jak2 Exon 12 mutation studies were negative and rule out polycythemia vera with 62% certainty .  Likely secondary. ? Dehydration. Rule out sleep apnea. Plan -Patient was recommended to maintain good oral fluid hydration. -Polycythemia recurs would recommend consideration of sleep study. -Patient was recommended diet and exercise to optimize  his body weight.  #2 Elevated ferritin levels. Ferritin level of 675 HFE gene mutations neg and suggest against hereditary hemochromatosis. Patient does not report any family history of hemochromatosis, early liver cirrhosis or sudden cardiac death. Elevated ferritin could also be from some element of hepatocellular injury due to fatty liver Plan -Lifestyle modifications to address fatty liver with primary care physician. -Decrease intake of red meats and discontinue any supplementation containing iron. -Reasonable to try to control ferritin levels with donating 2-4 units of PRBCs yearly - patient notes that he likes this idea.  . Orders Placed This Encounter  Procedures  . CBC & Diff and Retic    Standing Status:   Future    Standing Expiration Date:   04/08/2018  . Comprehensive metabolic panel    Standing Status:   Future    Standing Expiration Date:   04/08/2018  . Ferritin    Standing Status:   Future    Standing Expiration Date:   04/08/2018  . Iron and TIBC    Standing Status:   Future    Standing Expiration Date:   04/08/2018   RTC with Dr Irene Limbo in 6 months with labs  All of the patients questions were answered with apparent satisfaction. The patient knows to call the clinic with any problems, questions or concerns.  I spent 20 minutes counseling the patient face to face. The total time spent in the appointment was 25 minutes and more than 50% was on counseling and direct patient cares.    Sullivan Lone MD [place] [facility] Hematology/Oncology Physician [facility]  (Office):       [phone] (Work cell):  [phone] (Fax):           [phone]  04/08/2017 4:55 PM

## 2017-04-15 ENCOUNTER — Telehealth: Payer: Self-pay

## 2017-04-15 NOTE — Telephone Encounter (Signed)
Spoke with patient concerning upcoming appointment for 2/8

## 2017-04-29 ENCOUNTER — Encounter: Payer: Self-pay | Admitting: Internal Medicine

## 2017-04-29 ENCOUNTER — Encounter: Payer: Self-pay | Admitting: Gastroenterology

## 2017-04-30 NOTE — Telephone Encounter (Signed)
PCCs to note pt concern  Do I need to do a referral when one is already in place?

## 2017-04-30 NOTE — Telephone Encounter (Signed)
[name] [name] spoke to pt - no need for referral

## 2017-05-05 ENCOUNTER — Other Ambulatory Visit: Payer: Self-pay | Admitting: Internal Medicine

## 2017-05-05 MED ORDER — ALPRAZOLAM 0.5 MG PO TABS: ORAL_TABLET | ORAL | 2 refills | Status: DC

## 2017-05-05 NOTE — Telephone Encounter (Signed)
Check [place] registry last filled 04/05/2017.../lmb  

## 2017-05-05 NOTE — Telephone Encounter (Signed)
Done hardcopy to [place]  

## 2017-05-05 NOTE — Telephone Encounter (Signed)
Pt called for a refill of his ALPRAZolam (XANAX) 0.5 MG tablet Last seen for 01/06/2017 for acute  Please advise

## 2017-05-05 NOTE — Telephone Encounter (Signed)
Faxed

## 2017-05-06 NOTE — Telephone Encounter (Signed)
Pt notifed of Rx faxed.

## 2017-05-10 ENCOUNTER — Other Ambulatory Visit: Payer: Self-pay | Admitting: Internal Medicine

## 2017-05-11 NOTE — Telephone Encounter (Signed)
Done hardcopy to [place]  

## 2017-05-11 NOTE — Telephone Encounter (Signed)
Faxed

## 2017-05-13 ENCOUNTER — Emergency Department (HOSPITAL_COMMUNITY): Payer: Medicare HMO

## 2017-05-13 ENCOUNTER — Encounter: Payer: Self-pay | Admitting: Gastroenterology

## 2017-05-13 ENCOUNTER — Ambulatory Visit: Payer: Medicare HMO | Admitting: Gastroenterology

## 2017-05-13 ENCOUNTER — Encounter (HOSPITAL_COMMUNITY): Payer: Self-pay | Admitting: Emergency Medicine

## 2017-05-13 ENCOUNTER — Emergency Department (HOSPITAL_COMMUNITY)
Admission: EM | Admit: 2017-05-13 | Discharge: 2017-05-13 | Disposition: A | Discharge status: Home / Self Care | Payer: Medicare HMO | Attending: Emergency Medicine | Admitting: Emergency Medicine

## 2017-05-13 VITALS — BP 134/77 | HR 79 | Temp 98.0°F | Ht 74.0 in | Wt 250.0 lb

## 2017-05-13 DIAGNOSIS — R0981 Nasal congestion: Secondary | ICD-10-CM

## 2017-05-13 DIAGNOSIS — B370 Candidal stomatitis: Secondary | ICD-10-CM

## 2017-05-13 DIAGNOSIS — R0602 Shortness of breath: Secondary | ICD-10-CM | POA: Diagnosis not present

## 2017-05-13 DIAGNOSIS — Z7982 Long term (current) use of aspirin: Secondary | ICD-10-CM | POA: Diagnosis not present

## 2017-05-13 DIAGNOSIS — Z79899 Other long term (current) drug therapy: Secondary | ICD-10-CM | POA: Insufficient documentation

## 2017-05-13 DIAGNOSIS — I10 Essential (primary) hypertension: Secondary | ICD-10-CM | POA: Insufficient documentation

## 2017-05-13 DIAGNOSIS — J45909 Unspecified asthma, uncomplicated: Secondary | ICD-10-CM | POA: Diagnosis not present

## 2017-05-13 DIAGNOSIS — J029 Acute pharyngitis, unspecified: Secondary | ICD-10-CM | POA: Diagnosis not present

## 2017-05-13 MED ORDER — ALBUTEROL SULFATE (2.5 MG/3ML) 0.083% IN NEBU: 5.0000 mg | INHALATION_SOLUTION | Freq: Once | RESPIRATORY_TRACT | Status: DC

## 2017-05-13 MED ORDER — CLOTRIMAZOLE 10 MG MT TROC
10.0000 mg | Freq: Every day | OROMUCOSAL | 0 refills | Status: AC
Start: 2017-05-13 — End: 2017-05-20

## 2017-05-13 MED ORDER — PSEUDOEPHEDRINE HCL 30 MG PO TABS: 30.0000 mg | ORAL_TABLET | ORAL | 0 refills | Status: DC | PRN

## 2017-05-13 MED ORDER — SODIUM CHLORIDE 0.9 % IV SOLN: 500.0000 mL | INTRAVENOUS | Status: DC

## 2017-05-13 NOTE — ED Triage Notes (Signed)
Patient states that was supposed to have colonoscopy today but was concerned with breathing so thought he needed to come to ED. Patient c/o of nasal congestion, clogged ears, sore throat.  Patient hasnt had home meds today due to having procedure this am. Patient states that did has some chest tightness this am but hasnt had anxiety medications nor acid reflux medications.

## 2017-05-13 NOTE — ED Provider Notes (Addendum)
Medical screening examination/treatment/procedure(s) were conducted as a shared visit with non-physician practitioner(s) and myself.  I personally evaluated the patient during the encounter.   EKG Interpretation None       Patient was due to have a colonoscopy today. Because he was doing a bowel prep he was unable to take his anxiety medications yesterday evening. He reports he started getting a feeling of congestion in his nose, ears and throat felt like his chest was getting tight and breathing difficult.. Due to all the symptoms he determined to come to the emergency department and evaluated and set up proceeding with his colonoscopy. On examination the patient is alert and nontoxic. He is in no distress. Naris and airway are patent but patient does have very thick white plaque over his tongue. No respiratory distress lungs clear. Movements are coordinated purposeful symmetric. Skin is warm and dry. Patient is clinically well and appearance. At this time it does appear respiratory symptoms were likely exacerbated by anxiety and patient's inability to take his alprazolam due to bowel prep. ENT exam does show findings suggestive of oral thrush which may be the etiology of some the patient's symptoms.Recommend treatment with Mycelex troche and follow-up with PCP. Charlesetta Shanks, MD 05/13/17 8469    Charlesetta Shanks, MD 05/18/17 1434

## 2017-05-13 NOTE — Progress Notes (Signed)
Pt. Stated that he had some "trouble breathing and is stuffy".felt pressure in chest when taking prep",made CRNA aware,Dr.STARK into admitting to evaluate.pt. Stated to Dr. Fuller Plan that "if he talk to much he feels like he get SOB". DR. Fuller Plan evaluating lungs. When Dr. Fuller Plan ask pt.if his chest feeling tight pt. Stated "yes a little bit and sob" Dr. Fuller Plan instructed  Pt. to Go across to ED. And get his heart checked out before he has this procedure done to make sure there is nothing going on with his heart. Family member called backed to admitting and explained to her pt. Symptoms and instructed her to take him to the ED,she verbalize understanding.pt. Instructed to call back and reschedule procedure once evaluation of heart is completed,verbalize understanding.

## 2017-05-13 NOTE — ED Provider Notes (Signed)
[facility] DEPT Provider Note   CSN: [id number] Arrival date & time: 05/13/17  1053     History   Chief Complaint Chief Complaint  Patient presents with  . Nasal Congestion  . ears clogged  . Sore Throat    HPI  [name] [name] is a 66 y.o. Male with a history of anxiety, asthma, and allergic rhinitis, and hypertension, who presents today c concerned about his breathing with complaints of nasal congestion, ear pressure and sore throat. Patient was supposed to have a colonoscopy today but came to the ED instead because of this. Patient also reports a history of anxiety, and because he was completing the bowel prep for his colonoscopy he held his anxiety medications last night. Patient reports some mild nasal congestion, sinus pressure, mild headache, and ear pressure which started yesterday, and he feels got worse this morning. Patient reports he had some clear rhinorrhea this morning. Patient has not tried anything at home to treat the symptoms. Patient also reports that he was feeling very anxious and didn't want to have this colonoscopy done and feels that some of his chest tightness and breathing concerns may have been related to holding his anxiety medication. Patient reports since he's been here at the emergency department he feels like his breathing has improved, he denies chest pain, pain with breathing or wheezing. Patient denies fevers or chills, no known sick contacts.        Past Medical History:  Diagnosis Date  . Allergic rhinitis   . Allergy    SEASONAL,USE TO TAKE ALLERGY SHOTS  . Anxiety   . Arthritis    BACK,KNEES  . Asthma, mild   . Borderline diabetes mellitus   . Depression   . Diverticulosis of colon   . Gallstones   . Generalized anxiety disorder   . GERD (gastroesophageal reflux disease)   . Hemorrhoids   . History of colonic polyps   . Hyperlipidemia   . Hypertension   . Lumbar back pain   . Obesity   . Venous insufficiency     Patient  Active Problem List   Diagnosis Date Noted  . RUQ pain 01/06/2017  . Skin lesion 07/21/2016  . SOB (shortness of breath)   . Chest pain 12/06/2015  . Cough 09/28/2015  . Chronic lower back pain 01/09/2014  . Encounter for preventative adult health care exam with abnormal findings 01/09/2014  . Mastoiditis and related conditions 07/03/2011  . UNSPECIFIED ANEMIA 01/14/2010  . COLONIC POLYPS 01/04/2008  . Hyperlipidemia 01/04/2008  . VENOUS INSUFFICIENCY 01/04/2008  . Asthma 01/04/2008  . DIVERTICULOSIS OF COLON 01/04/2008  . OBESITY 06/23/2007  . ANXIETY DISORDER, GENERALIZED 06/23/2007  . Depression 06/23/2007  . Essential hypertension 06/23/2007  . HEMORRHOIDS 06/23/2007  . Allergic rhinitis 06/23/2007  . GERD 06/23/2007  . BACK PAIN, LUMBAR 06/23/2007  . Pre-diabetes 06/23/2007    Past Surgical History:  Procedure Laterality Date  . COLONOSCOPY    . LUMBAR FUSION    . LUMBAR LAMINECTOMY  [id number]  . POLYPECTOMY         Home Medications    Prior to Admission medications   Medication Sig Start Date End Date Taking? Authorizing Provider  albuterol (PROVENTIL HFA;VENTOLIN HFA) 108 (90 Base) MCG/ACT inhaler Inhale 2 puffs into the lungs every 6 (six) hours as needed for wheezing or shortness of breath. 07/27/16   Biagio Borg, MD  ALPRAZolam [name]) 0.5 MG tablet TAKE 1 TABLET THREE TIMES DAILY AS NEEDED FOR ANXIETY  OR  SLEEP 05/05/17   Biagio Borg, MD  amLODipine (NORVASC) 5 MG tablet TAKE 1 TABLET EVERY DAY 03/30/17   Biagio Borg, MD  aspirin 81 MG tablet Take 81 mg by mouth daily.      [provider]  losartan (COZAAR) 50 MG tablet Take 1 tablet (50 mg total) by mouth daily. 03/30/17   Biagio Borg, MD  omeprazole (PRILOSEC) 40 MG capsule Take 40 mg by mouth daily.    [provider]  Sodium Chloride (NASAL MIST IN) Inhale into the lungs.    [provider]  traMADol (ULTRAM) 50 MG tablet TAKE 1 TABLET EVERY 8 HOURS AS NEEDED.  05/11/17    Biagio Borg, MD  triamcinolone (NASACORT AQ) 55 MCG/ACT AERO nasal inhaler Place 2 sprays into the nose daily. Patient not taking: Reported on 05/13/2017 07/27/16   Biagio Borg, MD    Family History Family History  Problem Relation Age of Onset  . Stroke Father   . Heart attack Father   . Colon cancer Father   . Diabetes Father   . Prostate cancer Father   . Lung cancer Mother   . Colon cancer Maternal Grandfather     Social History Social History  Substance Use Topics  . Smoking status: Never Smoker  . Smokeless tobacco: Never Used  . Alcohol use No     Allergies   Patient has no known allergies.   Review of Systems Review of Systems  Constitutional: Negative for chills and fever.  HENT: Positive for congestion, ear discharge and rhinorrhea.   Eyes: Negative for pain and discharge.  Respiratory: Positive for chest tightness and shortness of breath. Negative for cough.   Cardiovascular: Negative for chest pain and palpitations.  Gastrointestinal: Negative for abdominal pain, nausea and vomiting.  Genitourinary: Negative for difficulty urinating and dysuria.  Musculoskeletal: Negative for myalgias.  Skin: Negative for pallor and rash.  Neurological: Positive for headaches. Negative for dizziness and weakness.     Physical Exam Updated Vital Signs BP 139/86 (BP Location: Right Arm)   Pulse 80   Temp 98.6 F (37 C) (Oral)   Resp 16   SpO2 96%   Physical Exam  Constitutional: He is oriented to person, place, and time. He appears well-developed and well-nourished. No distress.  HENT:  Head: Normocephalic and atraumatic.  Mouth/Throat: Oropharynx is clear and moist.  TMs clear, moderate amount of wax present bilaterally. Some edema of the nasal mucosa with clear rhinorrhea present. Posterior oropharynx clear, mild erythema, no edema or exudates. Uvula midline, no trismus. White coating on tongue.  Eyes: Pupils are equal, round, and reactive to light. EOM are  normal. Right eye exhibits no discharge. Left eye exhibits no discharge.  Neck: Neck supple.  Cardiovascular: Normal rate, regular rhythm, normal heart sounds and intact distal pulses.   Pulmonary/Chest: Effort normal and breath sounds normal. No respiratory distress. He has no wheezes. He has no rales.  No evidence of respiratory distress, good chest expansion bilaterally, lung fields clear with no wheezing or rales present  Lymphadenopathy:    He has no cervical adenopathy.  Neurological: He is alert and oriented to person, place, and time. Coordination normal.  Alert and oriented and following commands, moving all extremities with normal coordination  Skin: Skin is warm and dry. Capillary refill takes less than 2 seconds. He is not diaphoretic.  Psychiatric: He has a normal mood and affect. His behavior is normal.  Nursing note and  vitals reviewed.    ED Treatments / Results  Labs (all labs ordered are listed, but only abnormal results are displayed) Labs Reviewed - No data to display  EKG  EKG Interpretation None       Radiology Dg Chest 2 View  Result Date: 05/13/2017 CLINICAL DATA:  Hypertension.  Shortness of breath with congestion EXAM: CHEST  2 VIEW COMPARISON:  July 21, 2016 FINDINGS: There is no edema or consolidation. The heart size and pulmonary vascularity are normal. No adenopathy. There is stable anterior wedging at T12. IMPRESSION: No edema or consolidation. Electronically Signed   By: Lowella Grip III M.D.   On: 05/13/2017 12:04    Procedures Procedures (including critical care time)  Medications Ordered in ED Medications  albuterol (PROVENTIL) (2.5 MG/3ML) 0.083% nebulizer solution 5 mg (not administered)     Initial Impression / Assessment and Plan / ED Course  I have reviewed the triage vital signs and the nursing notes.  Pertinent labs & imaging results that were available during my care of the patient were reviewed by me and considered in  my medical decision making (see chart for details).  Patient presents concerned about his breathing with nasal congestion, ear pressure and sore throat. Vitals normal on initial eval, no evidence of respiratory distress. Patient feels his breathing is much better now than when he arrived to ED. Patient takes alprazolam nightly for anxiety and he feels holding this medication likely also contributed to chest tightness and SOB. CXR clear. Nasal congestion and ear pressure likely from allergic rhinitis or viral syndrome, symptomatic treatment with Flonase and decongestants. TMs clear, no concern for ear infection or mastoiditis. Sore throat with white coating on tongue, likely thrush, no tonsillar exudates or edema, no trismus, tolerating secretions and able to take in fluids. Will treat with Mycelex troches. Discussed results with patient and provided reassurance, patient will be discharged home, return precautions provided and patient is in agreement.  Patient discussed with Dr. Johnney Killian, who saw patient as well and agrees with plan.  Final Clinical Impressions(s) / ED Diagnoses   Final diagnoses:  Nasal congestion  Sore throat  Thrush    New Prescriptions Discharge Medication List as of 05/13/2017  4:47 PM    START taking these medications   Details  clotrimazole (MYCELEX) 10 MG troche Take 1 tablet (10 mg total) by mouth 5 (five) times daily., Starting Thu 05/13/2017, Until Thu 05/20/2017, Print    pseudoephedrine (SUDAFED) 30 MG tablet Take 1 tablet (30 mg total) by mouth every 4 (four) hours as needed for congestion., Starting Thu 05/13/2017, Print           Jacqlyn Larsen, PA-C 05/14/17 0107    Charlesetta Shanks, MD 05/18/17 [phone]

## 2017-05-13 NOTE — ED Notes (Signed)
ED PA at bedside

## 2017-05-13 NOTE — Discharge Instructions (Signed)
Use Mycelex troches 5 times a day for the next 7 days to treat oral thrush, this can likely explain some of the sore throat before experiencing. Sudafed for nasal congestion and ear pressure, and continue using Flonase. Return to emergency department a few develop shortness of breath, chest tightness, fevers or chills, or other new or concerning symptoms. Follow-up with your primary care doctor.

## 2017-05-31 ENCOUNTER — Encounter: Payer: Self-pay | Admitting: Internal Medicine

## 2017-05-31 ENCOUNTER — Ambulatory Visit (INDEPENDENT_AMBULATORY_CARE_PROVIDER_SITE_OTHER): Payer: Medicare HMO | Admitting: Internal Medicine

## 2017-05-31 VITALS — BP 122/84 | HR 63 | Temp 97.6°F | Ht 74.0 in | Wt 249.0 lb

## 2017-05-31 DIAGNOSIS — Z23 Encounter for immunization: Secondary | ICD-10-CM

## 2017-05-31 DIAGNOSIS — J4520 Mild intermittent asthma, uncomplicated: Secondary | ICD-10-CM

## 2017-05-31 DIAGNOSIS — H6983 Other specified disorders of Eustachian tube, bilateral: Secondary | ICD-10-CM | POA: Diagnosis not present

## 2017-05-31 DIAGNOSIS — I10 Essential (primary) hypertension: Secondary | ICD-10-CM | POA: Diagnosis not present

## 2017-05-31 DIAGNOSIS — H6123 Impacted cerumen, bilateral: Secondary | ICD-10-CM

## 2017-05-31 DIAGNOSIS — Z0000 Encounter for general adult medical examination without abnormal findings: Secondary | ICD-10-CM

## 2017-05-31 DIAGNOSIS — H6993 Unspecified Eustachian tube disorder, bilateral: Secondary | ICD-10-CM | POA: Insufficient documentation

## 2017-05-31 DIAGNOSIS — J309 Allergic rhinitis, unspecified: Secondary | ICD-10-CM | POA: Diagnosis not present

## 2017-05-31 DIAGNOSIS — H9193 Unspecified hearing loss, bilateral: Secondary | ICD-10-CM | POA: Diagnosis not present

## 2017-05-31 DIAGNOSIS — R7303 Prediabetes: Secondary | ICD-10-CM | POA: Diagnosis not present

## 2017-05-31 MED ORDER — FLUTICASONE PROPIONATE 50 MCG/ACT NA SUSP: 2.0000 | Freq: Every day | NASAL | 11 refills | Status: DC

## 2017-05-31 MED ORDER — METHYLPREDNISOLONE ACETATE 80 MG/ML IJ SUSP
80.0000 mg | Freq: Once | INTRAMUSCULAR | Status: AC
  Administered 2017-05-31: 80 mg via INTRAMUSCULAR

## 2017-05-31 MED ORDER — PREDNISONE 10 MG PO TABS: ORAL_TABLET | ORAL | 0 refills | Status: DC

## 2017-05-31 MED ORDER — ALBUTEROL SULFATE HFA 108 (90 BASE) MCG/ACT IN AERS: 2.0000 | INHALATION_SPRAY | Freq: Four times a day (QID) | RESPIRATORY_TRACT | 5 refills | Status: DC | PRN

## 2017-05-31 MED ORDER — FLUTICASONE-SALMETEROL 250-50 MCG/DOSE IN AEPB: 1.0000 | INHALATION_SPRAY | Freq: Two times a day (BID) | RESPIRATORY_TRACT | 11 refills | Status: DC

## 2017-05-31 NOTE — Assessment & Plan Note (Addendum)
Mild to mod, for depomedrol IM 80, predpac asd,  to f/u any worsening symptoms or concerns  /Note:  Total time for pt hx, exam, review of record with pt in the room, determination of diagnoses and plan for further eval and tx is > 40 min, with over 50% spent in coordination and counseling of patient, including the differential dx, tx, further evaluation and other management in the setting of chronic anxiety and verbosity, including allergy flare, asthma flare, HTN, hyperglycemia, bilat wax impactions and eustachian tube dysfxn

## 2017-05-31 NOTE — Assessment & Plan Note (Signed)
I would avoid further decongsetants due to his chronic anxiety, but ok for Mucinex bid prn

## 2017-05-31 NOTE — Assessment & Plan Note (Signed)
stable overall by history and exam, recent data reviewed with pt, and pt to continue medical treatment as before,  to f/u any worsening symptoms or concerns BP Readings from Last 3 Encounters:  05/31/17 122/84  05/13/17 (!) 145/82  05/13/17 134/77

## 2017-05-31 NOTE — Assessment & Plan Note (Signed)
stable overall by history and exam, recent data reviewed with pt, and pt to continue medical treatment as before,  to f/u any worsening symptoms or concerns Lab Results  Component Value Date   HGBA1C 6.1 01/06/2017

## 2017-05-31 NOTE — Patient Instructions (Addendum)
You had the steroid shot today  Please take all new medication as prescribed - the prednisone, and the Advair for the long term  You can also take Mucinex (or it's generic off brand) for congestion and ear congestion  Your ears were irrigated of wax today  Please continue all other medications as before, and refills have been done if requested  - the albuterol inhaler, and flonase  Please have the pharmacy call with any other refills you may need.  Please continue your efforts at being more active, low cholesterol diet, and weight control.  Please keep your appointments with your specialists as you may have planned  Please return in 6 months, or sooner if needed, with Lab testing done 3-5 days before

## 2017-05-31 NOTE — Progress Notes (Signed)
Subjective:    Patient ID: [name] [name], male    DOB: 1951/03/15, 66 y.o.   MRN: [id number]  HPI  Here to f/u 9/13 ED visit with allergy and asthma symptoms which were mild symptomatic in addition to not taking his xanax due to bowel prep.  Was given sudafed and restarted his xanax which have helped, but still has bilat ear congestion and sloshing with mild dizziness, as well as Does have several wks ongoing nasal allergy symptoms with clearish congestion, itch and sneezing, without fever, pain.  Has had some ST, but also increased non prod cough with wheezing/sob as well  Pt denies fever, wt loss, night sweats, loss of appetite, or other constitutional symptoms Past Medical History:  Diagnosis Date  . Allergic rhinitis   . Allergy    SEASONAL,USE TO TAKE ALLERGY SHOTS  . Anxiety   . Arthritis    BACK,KNEES  . Asthma, mild   . Borderline diabetes mellitus   . Depression   . Diverticulosis of colon   . Gallstones   . Generalized anxiety disorder   . GERD (gastroesophageal reflux disease)   . Hemorrhoids   . History of colonic polyps   . Hyperlipidemia   . Hypertension   . Lumbar back pain   . Obesity   . Venous insufficiency    Past Surgical History:  Procedure Laterality Date  . COLONOSCOPY    . LUMBAR FUSION    . LUMBAR LAMINECTOMY  [id number]  . POLYPECTOMY      reports that he has never smoked. He has never used smokeless tobacco. He reports that he does not drink alcohol or use drugs. family history includes Colon cancer in his father and maternal grandfather; Diabetes in his father; Heart attack in his father; Lung cancer in his mother; Prostate cancer in his father; Stroke in his father. No Known Allergies Current Outpatient Prescriptions on File Prior to Visit  Medication Sig Dispense Refill  . ALPRAZolam (XANAX) 0.5 MG tablet TAKE 1 TABLET THREE TIMES DAILY AS NEEDED FOR ANXIETY  OR  SLEEP 90 tablet 2  . amLODipine (NORVASC) 5 MG tablet TAKE 1 TABLET EVERY DAY 90  tablet 3  . aspirin 81 MG tablet Take 81 mg by mouth daily.      Marland Kitchen losartan (COZAAR) 50 MG tablet Take 1 tablet (50 mg total) by mouth daily. 90 tablet 3  . omeprazole (PRILOSEC) 40 MG capsule Take 40 mg by mouth daily.    . Sodium Chloride (NASAL MIST IN) Inhale into the lungs.    Manus Gunning BOWEL PREP KIT 17.5-3.13-1.6 GM/180ML SOLN Take 1 each by mouth once.     . traMADol (ULTRAM) 50 MG tablet TAKE 1 TABLET EVERY 8 HOURS AS NEEDED.  (Patient taking differently: TAKE 1 TABLET EVERY 8 HOURS AS NEEDED FOR PAIN.) 90 tablet 5   No current facility-administered medications on file prior to visit.    Review of Systems  Constitutional: Negative for other unusual diaphoresis or sweats HENT: Negative for ear discharge or swelling Eyes: Negative for other worsening visual disturbances Respiratory: Negative for stridor or other swelling  Gastrointestinal: Negative for worsening distension or other blood Genitourinary: Negative for retention or other urinary change Musculoskeletal: Negative for other MSK pain or swelling Skin: Negative for color change or other new lesions Neurological: Negative for worsening tremors and other numbness  Psychiatric/Behavioral: Negative for worsening agitation or other fatigue All other symptoms    Objective:   Physical Exam BP 122/84  Pulse 63   Temp 97.6 F (36.4 C) (Oral)   Ht 6' 2" (1.88 m)   Wt 249 lb (112.9 kg)   SpO2 99%   BMI 31.97 kg/m  VS noted, not ill appearing Constitutional: Pt appears in NAD HENT: Head: NCAT.  Right Ear: External ear normal.  Left Ear: External ear normal.  Eyes: . Pupils are equal, round, and reactive to light. Conjunctivae and EOM are normal Both ear canal wax impactions resolved with irrigation Bilat tm's with mild erythema.  Max sinus areas non tender but mild puffy.  Pharynx with mild erythema, no exudate Nose: without d/c or deformity Neck: Neck supple. Gross normal ROM Cardiovascular: Normal rate and regular  rhythm.   Pulmonary/Chest: Effort normal and breath sounds decreased without rales but with few mild wheezing.  Neurological: Pt is alert. At baseline orientation, motor grossly intact Skin: Skin is warm. No rashes, other new lesions, no LE edema Psychiatric: Pt behavior is normal without agitation , has chronic 2+ nervous and verbose No other exam findings Lab Results  Component Value Date   WBC 7.8 04/01/2017   HGB 16.7 04/01/2017   HCT 47.7 04/01/2017   PLT 231 04/01/2017   GLUCOSE 123 04/01/2017   CHOL 148 01/06/2017   TRIG 301.0 (H) 01/06/2017   HDL 35.40 (L) 01/06/2017   LDLDIRECT 75.0 01/06/2017   LDLCALC 55 12/07/2015   ALT 24 04/01/2017   AST 18 04/01/2017   NA 140 04/01/2017   K 4.0 04/01/2017   CL 102 01/06/2017   CREATININE 1.0 04/01/2017   BUN 10.4 04/01/2017   CO2 28 04/01/2017   TSH 0.87 01/06/2017   PSA 0.98 01/06/2017   INR 1.20 12/06/2015   HGBA1C 6.1 01/06/2017          Assessment & Plan:

## 2017-05-31 NOTE — Assessment & Plan Note (Signed)
Improved with irrigation 

## 2017-05-31 NOTE — Addendum Note (Signed)
Addended by: Biagio Borg on: 05/31/2017 02:39 PM   Modules accepted: Level of Service

## 2017-05-31 NOTE — Assessment & Plan Note (Signed)
With mild exacerbation, will restart advair he has taken in the past,  to f/u any worsening symptoms or concerns

## 2017-06-21 ENCOUNTER — Telehealth: Payer: Self-pay | Admitting: *Deleted

## 2017-06-21 NOTE — Telephone Encounter (Signed)
"  Call me [phone].  Saw Dr. Irene Limbo a few months ago.  Need to get set up to have blood taken.  I was delayed with colonoscopy.  (Therapeutic phlebotomies)  Now I've tried the [facility] but they cannot with medications, sick and my polycythemia vera.  [facility] recommended [facility] which I never heard of."   Routing call information to collaborative nurse and provider for review.  Further patient communication through collaborative nurse.

## 2017-06-22 ENCOUNTER — Telehealth: Payer: Self-pay

## 2017-06-22 NOTE — Telephone Encounter (Signed)
Pt communicated with [facility] that he was getting therapeutic phlebotomy. They denied him donating blood and recommended he go to [facility] on [address]. Pt given number to contact [phone]. Dr. Irene Limbo wanted pt to be aware that he does not have polycythemia or hemochromatosis, just an elevated ferritin level. Pt should not have to explain this to [place]. He should not be at risk, nor his blood at risk to donate. Doctor recommendation for pt to have 2-4 donations/year. Pt aware and will call [facility] prior to donation attempt to determine eligibility.

## 2017-06-22 NOTE — Telephone Encounter (Signed)
See Collaborative nurse 06-22-2017 phone note.

## 2017-07-02 ENCOUNTER — Other Ambulatory Visit: Payer: Self-pay | Admitting: Internal Medicine

## 2017-08-02 ENCOUNTER — Other Ambulatory Visit: Payer: Self-pay | Admitting: Internal Medicine

## 2017-08-02 DIAGNOSIS — M545 Low back pain: Secondary | ICD-10-CM

## 2017-08-05 DIAGNOSIS — M5416 Radiculopathy, lumbar region: Secondary | ICD-10-CM | POA: Diagnosis not present

## 2017-08-05 DIAGNOSIS — M5136 Other intervertebral disc degeneration, lumbar region: Secondary | ICD-10-CM | POA: Diagnosis not present

## 2017-08-05 DIAGNOSIS — M545 Low back pain: Secondary | ICD-10-CM | POA: Diagnosis not present

## 2017-08-06 ENCOUNTER — Telehealth: Payer: Self-pay | Admitting: Internal Medicine

## 2017-08-06 NOTE — Telephone Encounter (Signed)
Check [place] registry last filled 07/07/2017...[name] [name]

## 2017-08-06 NOTE — Telephone Encounter (Signed)
Copied from [place] [id number]. Topic: Quick Communication - See Telephone Encounter >> Aug 06, 2017  3:45 PM Aurelio Brash B wrote: CRM for notification. See Telephone encounter for:  Refill alprazolam  [facility] - pt worried about running out and not being able to get refill with the weather -  he had an issue in past when running out - Pt is expecting to run out by Tuesday - PT wants to get tomorrow.  08/06/17.

## 2017-08-09 MED ORDER — ALPRAZOLAM 0.5 MG PO TABS: ORAL_TABLET | ORAL | 2 refills | Status: DC

## 2017-08-09 NOTE — Telephone Encounter (Signed)
Done erx 

## 2017-08-09 NOTE — Telephone Encounter (Signed)
Pt is aware the office is closed today and tomorrow

## 2017-10-01 DIAGNOSIS — M5136 Other intervertebral disc degeneration, lumbar region: Secondary | ICD-10-CM | POA: Diagnosis not present

## 2017-10-01 DIAGNOSIS — M545 Low back pain: Secondary | ICD-10-CM | POA: Diagnosis not present

## 2017-10-01 DIAGNOSIS — M5417 Radiculopathy, lumbosacral region: Secondary | ICD-10-CM | POA: Diagnosis not present

## 2017-10-07 NOTE — Progress Notes (Signed)
HEMATOLOGY/ONCOLOGY CLINIC NOTE  Date of Service: 10/08/2017  Patient Care Team: Biagio Borg, MD as PCP - General (Internal Medicine)  CHIEF COMPLAINTS/PURPOSE OF CONSULTATION:  Elevated ferritin levels Polycythemia  HISTORY OF PRESENTING ILLNESS:   [name] [name] is a wonderful 67 y.o. male who has been referred to [place] by Dr .Biagio Borg, MD  for evaluation and management of polycythemia and Elevated ferritin levels.  Patient has a history of hypertension, dyslipidemia, borderline diabetes, asthma, GERD, obesity who on labs on 01/06/2017 with his primary care physician was noted to have an elevated hemoglobin of 18.1 and a hematocrit of 53.3 with a normal platelet count of 294k and a WBC count of 10k.  He was also seen by Dr. Fuller Plan with GI and had a ferritin level drawn as a part of workup for generalized abdominal pain. Ferritin level was noted to be elevated in the 600 range.  He is here for further evaluation and management of his polycythemia and elevated ferritin. Patient notes no family history of hemachromatosis.  No prior history of venous thromboembolism. No fevers no chills no night sweats no weight loss no new bone pains. No headaches no focal neurological deficits no dizziness no vision changes. He has previously had an ultrasound of the abdomen on 01/08/2017 that showed a normal size spleen, cholelithiasis without acute cholecystitis and abdominal appearance of the liver consistent with hepatic steatosis.  Patient reports no family history of iron overload issues. Notes some baseline mild fatigue. No unexpected weight loss. Patient notes that he has not been drinking as much water and believes that he was likely dehydrated on the day that his labs showed polycythemia.  INTERVAL HISTORY  Patient is here for follow-up of his polycythemia and elevated ferritin.The patient's last visit with [place] was on 04/08/17. The pt reports that he is doing well overall and reports that  he has donated blood at the community blood bank in late December and in October as well. He notes some right sided abdominal pain when he hasn't used the bathroom.  Lab results today (10/08/17) of CBC, CMP, and Reticulocytes is as follows: all values ae WNL. Ferritin is down to 435 from 675 , 6 months ago. LFTs WNL. Hgb/HCT show no evidence of polycythemia today  On review of systems, pt reports some right sided abdominal pain (when he hasn't used the bathroom), and denies leg swelling, and any other symptoms.     MEDICAL HISTORY:  Past Medical History:  Diagnosis Date  . Allergic rhinitis   . Allergy    SEASONAL,USE TO TAKE ALLERGY SHOTS  . Anxiety   . Arthritis    BACK,KNEES  . Asthma, mild   . Borderline diabetes mellitus   . Depression   . Diverticulosis of colon   . Gallstones   . Generalized anxiety disorder   . GERD (gastroesophageal reflux disease)   . Hemorrhoids   . History of colonic polyps   . Hyperlipidemia   . Hypertension   . Lumbar back pain   . Obesity   . Venous insufficiency     SURGICAL HISTORY: Past Surgical History:  Procedure Laterality Date  . COLONOSCOPY    . LUMBAR FUSION    . LUMBAR LAMINECTOMY  [id number]  . POLYPECTOMY      SOCIAL HISTORY: Social History   Socioeconomic History  . Marital status: Single    Spouse name: Not on file  . Number of children: Not on file  . Years  of education: Not on file  . Highest education level: Not on file  Social Needs  . Financial resource strain: Not on file  . Food insecurity - worry: Not on file  . Food insecurity - inability: Not on file  . Transportation needs - medical: Not on file  . Transportation needs - non-medical: Not on file  Occupational History  . Occupation: Proofreader work    Fish farm manager: DISABLED    Comment: polo and replacements  . Occupation: disability    Comment: since 10/2003 due to his back  Tobacco Use  . Smoking status: Never Smoker  . Smokeless tobacco: Never Used    Substance and Sexual Activity  . Alcohol use: No  . Drug use: No  . Sexual activity: Not on file  Other Topics Concern  . Not on file  Social History Narrative  . Not on file    FAMILY HISTORY: Family History  Problem Relation Age of Onset  . Stroke Father   . Heart attack Father   . Colon cancer Father   . Diabetes Father   . Prostate cancer Father   . Lung cancer Mother   . Colon cancer Maternal Grandfather     ALLERGIES:  has No Known Allergies.  MEDICATIONS:  Current Outpatient Medications  Medication Sig Dispense Refill  . albuterol (PROVENTIL HFA;VENTOLIN HFA) 108 (90 Base) MCG/ACT inhaler Inhale 2 puffs into the lungs every 6 (six) hours as needed for wheezing or shortness of breath. 1 Inhaler 5  . ALPRAZolam (XANAX) 0.5 MG tablet TAKE 1 TABLET THREE TIMES DAILY AS NEEDED FOR ANXIETY  OR  SLEEP 90 tablet 2  . amLODipine (NORVASC) 5 MG tablet TAKE 1 TABLET EVERY DAY 90 tablet 3  . aspirin 81 MG tablet Take 81 mg by mouth daily.      . fluticasone (FLONASE) 50 MCG/ACT nasal spray Place 2 sprays into both nostrils daily. 18.2 g 11  . Fluticasone-Salmeterol (ADVAIR DISKUS) 250-50 MCG/DOSE AEPB Inhale 1 puff into the lungs 2 (two) times daily. 60 each 11  . losartan (COZAAR) 50 MG tablet Take 1 tablet (50 mg total) by mouth daily. 90 tablet 3  . omeprazole (PRILOSEC) 20 MG capsule TAKE 2 CAPSULES EVERY DAY 180 capsule 1  . omeprazole (PRILOSEC) 40 MG capsule Take 40 mg by mouth daily.    . predniSONE (DELTASONE) 10 MG tablet 3 tabs by mouth per day for 3 days,2tabs per day for 3 days,1tab per day for 3 days 18 tablet 0  . Sodium Chloride (NASAL MIST IN) Inhale into the lungs.    Manus Gunning BOWEL PREP KIT 17.5-3.13-1.6 GM/180ML SOLN Take 1 each by mouth once.     . traMADol (ULTRAM) 50 MG tablet TAKE 1 TABLET EVERY 8 HOURS AS NEEDED.  (Patient taking differently: TAKE 1 TABLET EVERY 8 HOURS AS NEEDED FOR PAIN.) 90 tablet 5   No current facility-administered medications for  this visit.     REVIEW OF SYSTEMS:    10 Point review of Systems was done is negative except as noted above.  PHYSICAL EXAMINATION: ECOG PERFORMANCE STATUS: 1 - Symptomatic but completely ambulatory  . Vitals:   10/08/17 1039  BP: 132/85  Pulse: 62  Resp: 18  Temp: 98.3 F (36.8 C)  SpO2: 98%   Filed Weights   10/08/17 1039  Weight: 250 lb 9.6 oz (113.7 kg)   .Body mass index is 32.18 kg/m.  GENERAL:alert, in no acute distress and comfortable SKIN: no acute rashes, no significant  lesions EYES: conjunctiva are pink and non-injected, sclera anicteric OROPHARYNX: MMM, no exudates, no oropharyngeal erythema or ulceration NECK: supple, no JVD LYMPH:  no palpable lymphadenopathy in the cervical, axillary or inguinal regions LUNGS: clear to auscultation b/l with normal respiratory effort HEART: regular rate & rhythm ABDOMEN:  Obese, normoactive bowel sounds , non tender, not distended. No palpable hepatosplenomegaly Extremity: no pedal edema PSYCH: alert & oriented x 3 with fluent speech NEURO: no focal motor/sensory deficits  LABORATORY DATA:  I have reviewed the data as listed  . CBC Latest Ref Rng & Units 10/08/2017 04/01/2017 03/24/2017  WBC 4.0 - 10.3 K/uL 7.7 7.8 7.1  Hemoglobin 13.0 - 17.1 g/dL - 16.7 16.4  Hematocrit 38.4 - 49.9 % 48.6 47.7 46.2  Platelets 140 - 400 K/uL 261 231 214    . CMP Latest Ref Rng & Units 10/08/2017 04/01/2017 03/24/2017  Glucose 70 - 140 mg/dL 126 123 154(H)  BUN 7 - 26 mg/dL 7 10.4 10.9  Creatinine 0.70 - 1.30 mg/dL 0.95 1.0 1.0  Sodium 136 - 145 mmol/L 141 140 140  Potassium 3.5 - 5.1 mmol/L 3.8 4.0 4.0  Chloride 98 - 109 mmol/L 104 - -  CO2 22 - 29 mmol/L _0 Calcium 8.4 - 10.4 mg/dL 8.9 9.3 9.1  Total Protein 6.4 - 8.3 g/dL 6.5 6.8 6.5  Total Bilirubin 0.2 - 1.2 mg/dL 0.6 0.84 0.88  Alkaline Phos 40 - 150 U/L 67 68 67  AST 5 - 34 U/L _1 ALT 0 - 55 U/L _2 Hemochromatosis DNA-PCR(c282y,h63d)    Order: [id number]  Status:  Final result Visible to patient:  No (Not Released) Next appt:  05/13/2017 at 11:00 AM in [facility] T. Fuller Plan, MD) Dx:  Elevated ferritin  Component 7d ago  Hemochromatosis Gene Comment   Comment: NO MUTATION IDENTIFIED         RADIOGRAPHIC STUDIES: I have personally reviewed the radiological images as listed and agreed with the findings in the report. No results found.  ASSESSMENT & PLAN:   67 y.o.  Caucasian male with  #1 Polycythemia. Patient with hemoglobin of 16.6 with hematocrit of 48.6. Repeat labs showed resolution of polycythemia suggesting that this could've been related to hemoconcentration from dehydration. Jak2 V617F and Jak2 Exon 12 mutation studies were negative and rule out polycythemia vera with 14% certainty .  Likely secondary. ? Dehydration. Rule out sleep apnea. Plan -Patient was recommended to maintain good oral fluid hydration. -Polycythemia recurs would recommend consideration of sleep study. -Patient was recommended diet and exercise to optimize his body weight.  #2 Elevated ferritin levels. Ferritin level down to 435. HFE gene mutations neg and suggest against hereditary hemochromatosis. Patient does not report any family history of hemochromatosis, early liver cirrhosis or sudden cardiac death. Elevated ferritin could also be from some element of hepatocellular injury due to fatty liver Plan -LFTs WNL today -Lifestyle modifications to address fatty liver with primary care physician. -Decrease intake of red meats and discontinue any supplementation containing iron. --minimize ETOH use -Discussed pt labwork today, which looks very good, all values WNL, and his ferritin has dropped significantly from 6 months ago. -Pt notes significant satisfaction with his [facility] donations, and we advised maintaining this practice 3-4 times per year.  -We will follow up for re-evaluation of this plan in one year.  RTC with  Dr Irene Limbo in 12 months with labs  All of  the patients questions were answered with apparent satisfaction. The patient knows to call the clinic with any problems, questions or concerns.  I spent 15 minutes counseling the patient face to face. The total time spent in the appointment was 15 minutes and more than 50% was on counseling and direct patient cares.    Sullivan Lone MD [place] AAHIVMS [facility] Hematology/Oncology Physician [facility]  (Office):       [phone] (Work cell):  [phone] (Fax):           [phone]  This document serves as a record of services personally performed by Sullivan Lone, MD. It was created on his behalf by Baldwin Jamaica, a trained medical scribe. The creation of this record is based on the scribe's personal observations and the provider's statements to them.   .I have reviewed the above documentation for accuracy and completeness, and I agree with the above. Brunetta Genera MD MS

## 2017-10-08 ENCOUNTER — Inpatient Hospital Stay: Payer: Medicare HMO

## 2017-10-08 ENCOUNTER — Inpatient Hospital Stay: Payer: Medicare HMO | Attending: Hematology | Admitting: Hematology

## 2017-10-08 VITALS — BP 132/85 | HR 62 | Temp 98.3°F | Resp 18 | Ht 74.0 in | Wt 250.6 lb

## 2017-10-08 DIAGNOSIS — J45909 Unspecified asthma, uncomplicated: Secondary | ICD-10-CM | POA: Insufficient documentation

## 2017-10-08 DIAGNOSIS — Z7982 Long term (current) use of aspirin: Secondary | ICD-10-CM | POA: Diagnosis not present

## 2017-10-08 DIAGNOSIS — K219 Gastro-esophageal reflux disease without esophagitis: Secondary | ICD-10-CM | POA: Insufficient documentation

## 2017-10-08 DIAGNOSIS — R7989 Other specified abnormal findings of blood chemistry: Secondary | ICD-10-CM

## 2017-10-08 DIAGNOSIS — E785 Hyperlipidemia, unspecified: Secondary | ICD-10-CM | POA: Diagnosis not present

## 2017-10-08 DIAGNOSIS — R7303 Prediabetes: Secondary | ICD-10-CM | POA: Insufficient documentation

## 2017-10-08 DIAGNOSIS — E669 Obesity, unspecified: Secondary | ICD-10-CM | POA: Diagnosis not present

## 2017-10-08 DIAGNOSIS — R109 Unspecified abdominal pain: Secondary | ICD-10-CM | POA: Diagnosis not present

## 2017-10-08 DIAGNOSIS — Z79899 Other long term (current) drug therapy: Secondary | ICD-10-CM | POA: Diagnosis not present

## 2017-10-08 DIAGNOSIS — K760 Fatty (change of) liver, not elsewhere classified: Secondary | ICD-10-CM | POA: Insufficient documentation

## 2017-10-08 DIAGNOSIS — M545 Low back pain: Secondary | ICD-10-CM | POA: Diagnosis not present

## 2017-10-08 DIAGNOSIS — I10 Essential (primary) hypertension: Secondary | ICD-10-CM | POA: Insufficient documentation

## 2017-10-08 DIAGNOSIS — D751 Secondary polycythemia: Secondary | ICD-10-CM | POA: Diagnosis not present

## 2017-10-08 LAB — COMPREHENSIVE METABOLIC PANEL
ALT: 23 U/L (ref 0–55)
ANION GAP: 9 (ref 3–11)
AST: 16 U/L (ref 5–34)
Albumin: 3.8 g/dL (ref 3.5–5.0)
Alkaline Phosphatase: 67 U/L (ref 40–150)
BUN: 7 mg/dL (ref 7–26)
CHLORIDE: 104 mmol/L (ref 98–109)
CO2: 28 mmol/L (ref 22–29)
Calcium: 8.9 mg/dL (ref 8.4–10.4)
Creatinine, Ser: 0.95 mg/dL (ref 0.70–1.30)
Glucose, Bld: 126 mg/dL (ref 70–140)
Potassium: 3.8 mmol/L (ref 3.5–5.1)
Sodium: 141 mmol/L (ref 136–145)
Total Bilirubin: 0.6 mg/dL (ref 0.2–1.2)
Total Protein: 6.5 g/dL (ref 6.4–8.3)

## 2017-10-08 LAB — CBC WITH DIFFERENTIAL (CANCER CENTER ONLY)
BASOS PCT: 1 %
Basophils Absolute: 0.1 10*3/uL (ref 0.0–0.1)
EOS ABS: 0.2 10*3/uL (ref 0.0–0.5)
Eosinophils Relative: 3 %
HCT: 48.6 % (ref 38.4–49.9)
HEMOGLOBIN: 16.6 g/dL (ref 13.0–17.1)
LYMPHS ABS: 1.6 10*3/uL (ref 0.9–3.3)
Lymphocytes Relative: 21 %
MCH: 31 pg (ref 27.2–33.4)
MCHC: 34.2 g/dL (ref 32.0–36.0)
MCV: 90.5 fL (ref 79.3–98.0)
Monocytes Absolute: 0.6 10*3/uL (ref 0.1–0.9)
Monocytes Relative: 8 %
NEUTROS ABS: 5.1 10*3/uL (ref 1.5–6.5)
NEUTROS PCT: 67 %
Platelet Count: 261 10*3/uL (ref 140–400)
RBC: 5.37 MIL/uL (ref 4.20–5.82)
RDW: 13 % (ref 11.0–14.6)
WBC: 7.7 10*3/uL (ref 4.0–10.3)

## 2017-10-08 LAB — FERRITIN: FERRITIN: 435 ng/mL — AB (ref 22–316)

## 2017-10-08 LAB — IRON AND TIBC
IRON: 95 ug/dL (ref 42–163)
SATURATION RATIOS: 33 % — AB (ref 42–163)
TIBC: 288 ug/dL (ref 202–409)
UIBC: 192 ug/dL

## 2017-10-08 LAB — RETICULOCYTES
RBC.: 5.34 MIL/uL (ref 4.20–5.82)
RETIC CT PCT: 1.4 % (ref 0.8–1.8)
Retic Count, Absolute: 74.8 10*3/uL (ref 34.8–93.9)

## 2017-10-15 DIAGNOSIS — M545 Low back pain: Secondary | ICD-10-CM | POA: Diagnosis not present

## 2017-10-15 DIAGNOSIS — M5136 Other intervertebral disc degeneration, lumbar region: Secondary | ICD-10-CM | POA: Diagnosis not present

## 2017-10-15 DIAGNOSIS — M48061 Spinal stenosis, lumbar region without neurogenic claudication: Secondary | ICD-10-CM | POA: Diagnosis not present

## 2017-10-15 DIAGNOSIS — M5126 Other intervertebral disc displacement, lumbar region: Secondary | ICD-10-CM | POA: Diagnosis not present

## 2017-11-06 ENCOUNTER — Encounter: Payer: Self-pay | Admitting: Internal Medicine

## 2017-11-08 MED ORDER — ALPRAZOLAM 0.5 MG PO TABS: ORAL_TABLET | ORAL | 2 refills | Status: DC

## 2017-11-08 NOTE — Telephone Encounter (Signed)
Done erx 

## 2017-11-12 ENCOUNTER — Other Ambulatory Visit: Payer: Self-pay | Admitting: Internal Medicine

## 2017-11-12 NOTE — Telephone Encounter (Signed)
LOV: 05/31/17  Dr. Pedro Earls [place] [phone] [address] [place]

## 2017-11-12 NOTE — Telephone Encounter (Signed)
Copied from [place]. Topic: Quick Communication - See Telephone Encounter >> Nov 12, 2017  3:23 PM Conception Chancy, NT wrote: CRM for notification. See Telephone encounter for:  11/12/17.  Patient is calling and requesting a refill on traMADol (ULTRAM) 50 MG tablet please advise.  [place], [address]  [place] [address]  Phone: [phone] Fax: [phone]

## 2017-11-15 MED ORDER — TRAMADOL HCL 50 MG PO TABS: 50.0000 mg | ORAL_TABLET | Freq: Three times a day (TID) | ORAL | 0 refills | Status: DC | PRN

## 2017-11-24 DIAGNOSIS — G894 Chronic pain syndrome: Secondary | ICD-10-CM | POA: Diagnosis not present

## 2017-11-24 DIAGNOSIS — M961 Postlaminectomy syndrome, not elsewhere classified: Secondary | ICD-10-CM | POA: Diagnosis not present

## 2017-11-24 DIAGNOSIS — M48061 Spinal stenosis, lumbar region without neurogenic claudication: Secondary | ICD-10-CM | POA: Diagnosis not present

## 2017-11-29 ENCOUNTER — Encounter: Payer: Self-pay | Admitting: Internal Medicine

## 2017-11-29 ENCOUNTER — Other Ambulatory Visit (INDEPENDENT_AMBULATORY_CARE_PROVIDER_SITE_OTHER): Payer: Medicare HMO

## 2017-11-29 ENCOUNTER — Ambulatory Visit (INDEPENDENT_AMBULATORY_CARE_PROVIDER_SITE_OTHER): Payer: Medicare HMO | Admitting: Internal Medicine

## 2017-11-29 ENCOUNTER — Ambulatory Visit (INDEPENDENT_AMBULATORY_CARE_PROVIDER_SITE_OTHER)
Admission: RE | Admit: 2017-11-29 | Discharge: 2017-11-29 | Disposition: A | Discharge status: Home / Self Care | Payer: Medicare HMO | Source: Ambulatory Visit | Attending: Internal Medicine | Admitting: Internal Medicine

## 2017-11-29 VITALS — BP 122/76 | HR 63 | Temp 98.4°F | Ht 74.0 in | Wt 249.0 lb

## 2017-11-29 DIAGNOSIS — M8580 Other specified disorders of bone density and structure, unspecified site: Secondary | ICD-10-CM

## 2017-11-29 DIAGNOSIS — M85851 Other specified disorders of bone density and structure, right thigh: Secondary | ICD-10-CM

## 2017-11-29 DIAGNOSIS — Z1159 Encounter for screening for other viral diseases: Secondary | ICD-10-CM

## 2017-11-29 DIAGNOSIS — I10 Essential (primary) hypertension: Secondary | ICD-10-CM | POA: Diagnosis not present

## 2017-11-29 DIAGNOSIS — R7303 Prediabetes: Secondary | ICD-10-CM

## 2017-11-29 DIAGNOSIS — Z0000 Encounter for general adult medical examination without abnormal findings: Secondary | ICD-10-CM

## 2017-11-29 LAB — URINALYSIS, ROUTINE W REFLEX MICROSCOPIC
Bilirubin Urine: NEGATIVE
Ketones, ur: NEGATIVE
Leukocytes, UA: NEGATIVE
NITRITE: NEGATIVE
PH: 5.5 (ref 5.0–8.0)
Specific Gravity, Urine: >=1.03 — AB (ref 1.000–1.030)
TOTAL PROTEIN, URINE-UPE24: NEGATIVE
Urine Glucose: NEGATIVE
Urobilinogen, UA: 0.2 (ref 0.0–1.0)
WBC, UA: NONE SEEN (ref 0–?)

## 2017-11-29 LAB — CBC WITH DIFFERENTIAL/PLATELET
Basophils Absolute: 0.1 10*3/uL (ref 0.0–0.1)
Basophils Relative: 0.6 % (ref 0.0–3.0)
Eosinophils Absolute: 0.3 10*3/uL (ref 0.0–0.7)
Eosinophils Relative: 2.4 % (ref 0.0–5.0)
HEMATOCRIT: 47.4 % (ref 39.0–52.0)
Hemoglobin: 16.2 g/dL (ref 13.0–17.0)
LYMPHS ABS: 1.7 10*3/uL (ref 0.7–4.0)
LYMPHS PCT: 15 % (ref 12.0–46.0)
MCHC: 34.2 g/dL (ref 30.0–36.0)
MCV: 90.1 fl (ref 78.0–100.0)
MONOS PCT: 8.2 % (ref 3.0–12.0)
Monocytes Absolute: 0.9 10*3/uL (ref 0.1–1.0)
NEUTROS PCT: 73.8 % (ref 43.0–77.0)
Neutro Abs: 8.5 10*3/uL — ABNORMAL HIGH (ref 1.4–7.7)
Platelets: 297 10*3/uL (ref 150.0–400.0)
RBC: 5.26 Mil/uL (ref 4.22–5.81)
RDW: 12.3 % (ref 11.5–15.5)
WBC: 11.5 10*3/uL — ABNORMAL HIGH (ref 4.0–10.5)

## 2017-11-29 LAB — HEMOGLOBIN A1C: Hgb A1c MFr Bld: 6 % (ref 4.6–6.5)

## 2017-11-29 LAB — BASIC METABOLIC PANEL
BUN: 9 mg/dL (ref 6–23)
CALCIUM: 8.9 mg/dL (ref 8.4–10.5)
CO2: 27 mEq/L (ref 19–32)
Chloride: 100 mEq/L (ref 96–112)
Creatinine, Ser: 0.89 mg/dL (ref 0.40–1.50)
GFR: 90.86 mL/min (ref >60.00)
Glucose, Bld: 115 mg/dL — ABNORMAL HIGH (ref 70–99)
Potassium: 3.8 mEq/L (ref 3.5–5.1)
SODIUM: 138 meq/L (ref 135–145)

## 2017-11-29 LAB — TSH: TSH: 1.43 u[IU]/mL (ref 0.35–4.50)

## 2017-11-29 LAB — HEPATIC FUNCTION PANEL
ALBUMIN: 4.1 g/dL (ref 3.5–5.2)
ALK PHOS: 73 U/L (ref 39–117)
ALT: 20 U/L (ref 0–53)
AST: 14 U/L (ref 0–37)
Bilirubin, Direct: 0.2 mg/dL (ref 0.0–0.3)
TOTAL PROTEIN: 6.7 g/dL (ref 6.0–8.3)
Total Bilirubin: 0.6 mg/dL (ref 0.2–1.2)

## 2017-11-29 LAB — PSA: PSA: 1.17 ng/mL (ref 0.10–4.00)

## 2017-11-29 NOTE — Patient Instructions (Addendum)
Please continue all other medications as before, and refills have been done if requested.  Please schedule the bone density test before leaving today at the scheduling desk (where you check out)  Please have the pharmacy call with any other refills you may need.  Please continue your efforts at being more active, low cholesterol diet, and weight control.  You are otherwise up to date with prevention measures today.  Please keep your appointments with your specialists as you may have planned  Please go to the LAB in the Basement (turn left off the elevator) for the tests to be done today  You will be contacted by phone if any changes need to be made immediately.  Otherwise, you will receive a letter about your results with an explanation, but please check with MyChart first.  Please remember to sign up for MyChart if you have not done so, as this will be important to you in the future with finding out test results, communicating by private email, and scheduling acute appointments online when needed.  Please return in 6 months, or sooner if needed

## 2017-11-29 NOTE — Progress Notes (Signed)
Subjective:    Patient ID: [name] [name], male    DOB: 1951/06/26, 67 y.o.   MRN: [id number]  HPI  Here for wellness and f/u;  Overall doing ok;  Pt denies Chest pain, worsening SOB, DOE, wheezing, orthopnea, PND, worsening LE edema, palpitations, dizziness or syncope.  Pt denies neurological change such as new headache, facial or extremity weakness.  Pt denies polydipsia, polyuria, or low sugar symptoms. Pt states overall good compliance with treatment and medications, good tolerability, and has been trying to follow appropriate diet.  Pt denies worsening depressive symptoms, suicidal ideation or panic. No fever, night sweats, wt loss, loss of appetite, or other constitutional symptoms.  Pt states good ability with ADL's, has low fall risk, home safety reviewed and adequate, no other significant changes in hearing or vision, and only occasionally active with exercise. Declines colonoscopy for now, s now overdue 3 yrs.  Did have fall last wk with yardwork with pain to right lateral lower chest wall that is now near resolved.  Ortho has sent him to [facility] per Dr Tonita Cong for possible lumbar fusion, for t12 to L2 (already has prior l3 -L5.  Also seeing dr Nelva [place] for pain.  Taking senakot daily for constipation.  No other interval hx or compalints Past Medical History:  Diagnosis Date  . Allergic rhinitis   . Allergy    SEASONAL,USE TO TAKE ALLERGY SHOTS  . Anxiety   . Arthritis    BACK,KNEES  . Asthma, mild   . Borderline diabetes mellitus   . Depression   . Diverticulosis of colon   . Gallstones   . Generalized anxiety disorder   . GERD (gastroesophageal reflux disease)   . Hemorrhoids   . History of colonic polyps   . Hyperlipidemia   . Hypertension   . Lumbar back pain   . Obesity   . Venous insufficiency    Past Surgical History:  Procedure Laterality Date  . COLONOSCOPY    . LUMBAR FUSION    . LUMBAR LAMINECTOMY  [id number]  . POLYPECTOMY      reports that he has never smoked. He has  never used smokeless tobacco. He reports that he does not drink alcohol or use drugs. family history includes Colon cancer in his father and maternal grandfather; Diabetes in his father; Heart attack in his father; Lung cancer in his mother; Prostate cancer in his father; Stroke in his father. No Known Allergies Current Outpatient Medications on File Prior to Visit  Medication Sig Dispense Refill  . albuterol (PROVENTIL HFA;VENTOLIN HFA) 108 (90 Base) MCG/ACT inhaler Inhale 2 puffs into the lungs every 6 (six) hours as needed for wheezing or shortness of breath. 1 Inhaler 5  . ALPRAZolam (XANAX) 0.5 MG tablet TAKE 1 TABLET THREE TIMES DAILY AS NEEDED FOR ANXIETY  OR  SLEEP 90 tablet 2  . amLODipine (NORVASC) 5 MG tablet TAKE 1 TABLET EVERY DAY 90 tablet 3  . aspirin 81 MG tablet Take 81 mg by mouth daily.      . fluticasone (FLONASE) 50 MCG/ACT nasal spray Place 2 sprays into both nostrils daily. 18.2 g 11  . Fluticasone-Salmeterol (ADVAIR DISKUS) 250-50 MCG/DOSE AEPB Inhale 1 puff into the lungs 2 (two) times daily. 60 each 11  . losartan (COZAAR) 50 MG tablet Take 1 tablet (50 mg total) by mouth daily. 90 tablet 3  . omeprazole (PRILOSEC) 20 MG capsule TAKE 2 CAPSULES EVERY DAY 180 capsule 1  . omeprazole (PRILOSEC) 40 MG capsule Take  40 mg by mouth daily.    . predniSONE (DELTASONE) 10 MG tablet 3 tabs by mouth per day for 3 days,2tabs per day for 3 days,1tab per day for 3 days 18 tablet 0  . Sodium Chloride (NASAL MIST IN) Inhale into the lungs.    Manus Gunning BOWEL PREP KIT 17.5-3.13-1.6 GM/180ML SOLN Take 1 each by mouth once.     . traMADol (ULTRAM) 50 MG tablet Take 1 tablet (50 mg total) by mouth every 8 (eight) hours as needed. 90 tablet 0   No current facility-administered medications on file prior to visit.    Review of Systems Constitutional: Negative for other unusual diaphoresis, sweats, appetite or weight changes HENT: Negative for other worsening hearing loss, ear pain, facial  swelling, mouth sores or neck stiffness.   Eyes: Negative for other worsening pain, redness or other visual disturbance.  Respiratory: Negative for other stridor or swelling Cardiovascular: Negative for other palpitations or other chest pain  Gastrointestinal: Negative for worsening diarrhea or loose stools, blood in stool, distention or other pain Genitourinary: Negative for hematuria, flank pain or other change in urine volume.  Musculoskeletal: Negative for myalgias or other joint swelling.  Skin: Negative for other color change, or other wound or worsening drainage.  Neurological: Negative for other syncope or numbness. Hematological: Negative for other adenopathy or swelling Psychiatric/Behavioral: Negative for hallucinations, other worsening agitation, SI, self-injury, or new decreased concentration All other system neg per pt    Objective:   Physical Exam BP 122/76   Pulse 63   Temp 98.4 F (36.9 C) (Oral)   Ht _0  (1.88 m)   Wt 249 lb (112.9 kg)   SpO2 97%   BMI 31.97 kg/m  VS noted,  Constitutional: Pt is oriented to person, place, and time. Appears well-developed and well-nourished, in no significant distress and comfortable Head: Normocephalic and atraumatic  Eyes: Conjunctivae and EOM are normal. Pupils are equal, round, and reactive to light Right Ear: External ear normal without discharge Left Ear: External ear normal without discharge Nose: Nose without discharge or deformity Mouth/Throat: Oropharynx is without other ulcerations and moist  Neck: Normal range of motion. Neck supple. No JVD present. No tracheal deviation present or significant neck LA or mass Cardiovascular: Normal rate, regular rhythm, normal heart sounds and intact distal pulses.  Pulmonary/Chest: WOB normal and breath sounds without rales or wheezing  Abdominal: Soft. Bowel sounds are normal. NT. No HSM  Musculoskeletal: Normal range of motion. Exhibits no edema Lymphadenopathy: Has no other  cervical adenopathy.  Neurological: Pt is alert and oriented to person, place, and time. Pt has normal reflexes. No cranial nerve deficit. Motor grossly intact, Gait intact Skin: Skin is warm and dry. No rash noted or new ulcerations Psychiatric:  Has chronic very nervous mood and affect, and talks constantly without listening or wantin to answering questions. Behavior is otherwise normal without agitation No other exam findings    Assessment & Plan:

## 2017-11-29 NOTE — Telephone Encounter (Signed)
Error

## 2017-11-30 ENCOUNTER — Encounter: Payer: Self-pay | Admitting: Internal Medicine

## 2017-11-30 DIAGNOSIS — M8580 Other specified disorders of bone density and structure, unspecified site: Secondary | ICD-10-CM | POA: Insufficient documentation

## 2017-11-30 LAB — LIPID PANEL
CHOLESTEROL: 110 mg/dL (ref 0–200)
HDL: 29 mg/dL — AB (ref >39.00)
LDL Cholesterol: 57 mg/dL (ref 0–99)
NONHDL: 81.3
Total CHOL/HDL Ratio: 4
Triglycerides: 120 mg/dL (ref 0.0–149.0)
VLDL: 24 mg/dL (ref 0.0–40.0)

## 2017-11-30 LAB — HEPATITIS C ANTIBODY
Hepatitis C Ab: NONREACTIVE
SIGNAL TO CUT-OFF: 0.01 (ref <1.00)

## 2017-11-30 NOTE — Addendum Note (Signed)
Addended by: Biagio Borg on: 11/30/2017 09:13 AM   Modules accepted: Orders

## 2017-11-30 NOTE — Assessment & Plan Note (Signed)
stable overall by history and exam, recent data reviewed with pt, and pt to continue medical treatment as before,  to f/u any worsening symptoms or concerns Lab Results  Component Value Date   HGBA1C 6.1 01/06/2017

## 2017-11-30 NOTE — Assessment & Plan Note (Signed)
Pt asks for f/u dxa

## 2017-11-30 NOTE — Assessment & Plan Note (Signed)

## 2017-12-02 ENCOUNTER — Encounter: Payer: Self-pay | Admitting: Internal Medicine

## 2017-12-02 ENCOUNTER — Other Ambulatory Visit: Payer: Self-pay | Admitting: Internal Medicine

## 2017-12-03 ENCOUNTER — Telehealth: Payer: Self-pay | Admitting: Internal Medicine

## 2017-12-03 MED ORDER — TRAMADOL HCL 50 MG PO TABS: 50.0000 mg | ORAL_TABLET | Freq: Three times a day (TID) | ORAL | 0 refills | Status: DC | PRN

## 2017-12-03 MED ORDER — AMLODIPINE BESYLATE 5 MG PO TABS: 5.0000 mg | ORAL_TABLET | Freq: Every day | ORAL | 3 refills | Status: DC

## 2017-12-03 NOTE — Telephone Encounter (Signed)
Copied from [place]. Topic: Quick Communication - See Telephone Encounter >> Dec 03, 2017  3:58 PM Cleaster Corin, NT wrote: CRM for notification. See Telephone encounter for: 12/03/17.  Pt. Has questions for nurse or Doctor about med. amLODipine (NORVASC) 5 MG tablet [[id number]] and traMADol (ULTRAM) 50 MG tablet [[id number]] pt. Can be reached at [phone].

## 2017-12-03 NOTE — Telephone Encounter (Signed)
Pt wanted amlodipine sent to mail order.   Med has been corrected.

## 2017-12-03 NOTE — Telephone Encounter (Signed)
Last filled 11/15/2017 90#  Amlodipine has been sent

## 2017-12-03 NOTE — Telephone Encounter (Signed)
Done erx 

## 2018-01-10 ENCOUNTER — Other Ambulatory Visit: Payer: Self-pay | Admitting: Internal Medicine

## 2018-01-11 MED ORDER — TRAMADOL HCL 50 MG PO TABS: 50.0000 mg | ORAL_TABLET | Freq: Three times a day (TID) | ORAL | 1 refills | Status: DC | PRN

## 2018-01-11 NOTE — Telephone Encounter (Signed)
Done erx 

## 2018-01-11 NOTE — Telephone Encounter (Signed)
12/15/2017 90# 

## 2018-01-13 ENCOUNTER — Encounter: Payer: Self-pay | Admitting: Internal Medicine

## 2018-01-13 DIAGNOSIS — M48061 Spinal stenosis, lumbar region without neurogenic claudication: Secondary | ICD-10-CM | POA: Diagnosis not present

## 2018-01-13 DIAGNOSIS — M961 Postlaminectomy syndrome, not elsewhere classified: Secondary | ICD-10-CM | POA: Diagnosis not present

## 2018-01-19 DIAGNOSIS — M5416 Radiculopathy, lumbar region: Secondary | ICD-10-CM | POA: Diagnosis not present

## 2018-01-28 ENCOUNTER — Ambulatory Visit: Payer: Self-pay | Admitting: *Deleted

## 2018-01-28 NOTE — Telephone Encounter (Signed)
Noted  

## 2018-01-28 NOTE — Telephone Encounter (Signed)
Patient is calling with multiple symptoms. He states he has been to see a couple doctors for his S1 joint and back and had injections that have minimally helped his discomfort. He has been on several muscle relaxors and he complains of dizziness and shortness of breath at times. He states if he stays cool and is resting- his symptoms are not present. He has started taking his Alprazolam 3 times a day now. Patient states he has pain in his chest SOB when he sits up after resting some times. He states that he has tried using inhalers- but states he doesn't seem to see a big difference with the use. Patient seems to ramble when asked questions and he is a poor historian when he was asked questions he tends to ramble. Call to office- per flow coordinator- he is not emergent and appointment is scheduled for Monday. Patient given precautions for symptoms and he is aware to seek treatment if he should feel worse over the week end.  Reason for Disposition . [1] MODERATE longstanding difficulty breathing (e.g., speaks in phrases, SOB even at rest, pulse 100-120) AND [2] SAME as normal  Answer Assessment - Initial Assessment Questions 1. RESPIRATORY STATUS: "Describe your breathing?" (e.g., wheezing, shortness of breath, unable to speak, severe coughing)      SOB with activity and if patient is out in the warm/hot air 2. ONSET: "When did this breathing problem begin?"      Last week- when the medications where changed 3. PATTERN "Does the difficult breathing come and go, or has it been constant since it started?"      Comes/ goes 4. SEVERITY: "How bad is your breathing?" (e.g., mild, moderate, severe)    - MILD: No SOB at rest, mild SOB with walking, speaks normally in sentences, can lay down, no retractions, pulse < 100.    - MODERATE: SOB at rest, SOB with minimal exertion and prefers to sit, cannot lie down flat, speaks in phrases, mild retractions, audible wheezing, pulse 100-120.    - SEVERE: Very SOB at  rest, speaks in single words, struggling to breathe, sitting hunched forward, retractions, pulse > 120      moderate 5. RECURRENT SYMPTOM: "Have you had difficulty breathing before?" If so, ask: "When was the last time?" and "What happened that time?"      Yes- medication withdrawl 6. CARDIAC HISTORY: "Do you have any history of heart disease?" (e.g., heart attack, angina, bypass surgery, angioplasty)       7. LUNG HISTORY: "Do you have any history of lung disease?"  (e.g., pulmonary embolus, asthma, emphysema)      8. CAUSE: "What do you think is causing the breathing problem?"      Possible medication - patient wants to make sure it is not heart related 9. OTHER SYMPTOMS: "Do you have any other symptoms? (e.g., dizziness, runny nose, cough, chest pain, fever)     Dizziness,  10. PREGNANCY: "Is there any chance you are pregnant?" "When was your last menstrual period?"       n/a 11. TRAVEL: "Have you traveled out of the country in the last month?" (e.g., travel history, exposures)       n/a  Protocols used: BREATHING DIFFICULTY-A-AH

## 2018-01-28 NOTE — Telephone Encounter (Signed)
Scheduled to see Dr [name] on Monday, 01/31/18 to discuss these issues.

## 2018-01-31 ENCOUNTER — Ambulatory Visit (INDEPENDENT_AMBULATORY_CARE_PROVIDER_SITE_OTHER): Payer: Medicare HMO | Admitting: Internal Medicine

## 2018-01-31 ENCOUNTER — Encounter: Payer: Self-pay | Admitting: Internal Medicine

## 2018-01-31 ENCOUNTER — Other Ambulatory Visit: Payer: Self-pay | Admitting: Internal Medicine

## 2018-01-31 ENCOUNTER — Other Ambulatory Visit (INDEPENDENT_AMBULATORY_CARE_PROVIDER_SITE_OTHER): Payer: Medicare HMO

## 2018-01-31 ENCOUNTER — Ambulatory Visit (INDEPENDENT_AMBULATORY_CARE_PROVIDER_SITE_OTHER)
Admission: RE | Admit: 2018-01-31 | Discharge: 2018-01-31 | Disposition: A | Discharge status: Home / Self Care | Payer: Medicare HMO | Source: Ambulatory Visit | Attending: Internal Medicine | Admitting: Internal Medicine

## 2018-01-31 ENCOUNTER — Telehealth: Payer: Self-pay

## 2018-01-31 VITALS — BP 112/70 | HR 72 | Temp 98.1°F | Ht 74.0 in | Wt 240.0 lb

## 2018-01-31 DIAGNOSIS — F411 Generalized anxiety disorder: Secondary | ICD-10-CM | POA: Diagnosis not present

## 2018-01-31 DIAGNOSIS — R079 Chest pain, unspecified: Secondary | ICD-10-CM

## 2018-01-31 DIAGNOSIS — H9193 Unspecified hearing loss, bilateral: Secondary | ICD-10-CM

## 2018-01-31 DIAGNOSIS — J45901 Unspecified asthma with (acute) exacerbation: Secondary | ICD-10-CM | POA: Diagnosis not present

## 2018-01-31 DIAGNOSIS — M545 Low back pain, unspecified: Secondary | ICD-10-CM

## 2018-01-31 DIAGNOSIS — R0602 Shortness of breath: Secondary | ICD-10-CM | POA: Diagnosis not present

## 2018-01-31 DIAGNOSIS — R0600 Dyspnea, unspecified: Secondary | ICD-10-CM

## 2018-01-31 DIAGNOSIS — M5431 Sciatica, right side: Secondary | ICD-10-CM | POA: Insufficient documentation

## 2018-01-31 LAB — BASIC METABOLIC PANEL
BUN: 8 mg/dL (ref 6–23)
CHLORIDE: 102 meq/L (ref 96–112)
CO2: 29 meq/L (ref 19–32)
Calcium: 9.3 mg/dL (ref 8.4–10.5)
Creatinine, Ser: 1 mg/dL (ref 0.40–1.50)
GFR: 79.38 mL/min (ref >60.00)
GLUCOSE: 107 mg/dL — AB (ref 70–99)
POTASSIUM: 3.3 meq/L — AB (ref 3.5–5.1)
Sodium: 141 mEq/L (ref 135–145)

## 2018-01-31 LAB — CBC WITH DIFFERENTIAL/PLATELET
BASOS ABS: 0.1 10*3/uL (ref 0.0–0.1)
BASOS PCT: 1 % (ref 0.0–3.0)
EOS ABS: 0.3 10*3/uL (ref 0.0–0.7)
Eosinophils Relative: 3.5 % (ref 0.0–5.0)
HCT: 47.2 % (ref 39.0–52.0)
Hemoglobin: 16.5 g/dL (ref 13.0–17.0)
LYMPHS ABS: 1.8 10*3/uL (ref 0.7–4.0)
Lymphocytes Relative: 21.6 % (ref 12.0–46.0)
MCHC: 35 g/dL (ref 30.0–36.0)
MCV: 89 fl (ref 78.0–100.0)
MONO ABS: 0.8 10*3/uL (ref 0.1–1.0)
Monocytes Relative: 10.2 % (ref 3.0–12.0)
NEUTROS ABS: 5.2 10*3/uL (ref 1.4–7.7)
NEUTROS PCT: 63.7 % (ref 43.0–77.0)
PLATELETS: 270 10*3/uL (ref 150.0–400.0)
RBC: 5.3 Mil/uL (ref 4.22–5.81)
RDW: 13.5 % (ref 11.5–15.5)
WBC: 8.1 10*3/uL (ref 4.0–10.5)

## 2018-01-31 LAB — HEPATIC FUNCTION PANEL
ALT: 18 U/L (ref 0–53)
AST: 13 U/L (ref 0–37)
Albumin: 4.1 g/dL (ref 3.5–5.2)
Alkaline Phosphatase: 71 U/L (ref 39–117)
BILIRUBIN DIRECT: 0.1 mg/dL (ref 0.0–0.3)
BILIRUBIN TOTAL: 0.7 mg/dL (ref 0.2–1.2)
TOTAL PROTEIN: 6.8 g/dL (ref 6.0–8.3)

## 2018-01-31 MED ORDER — ALBUTEROL SULFATE HFA 108 (90 BASE) MCG/ACT IN AERS: 2.0000 | INHALATION_SPRAY | Freq: Four times a day (QID) | RESPIRATORY_TRACT | 5 refills | Status: DC | PRN

## 2018-01-31 MED ORDER — GABAPENTIN 100 MG PO CAPS: 100.0000 mg | ORAL_CAPSULE | Freq: Three times a day (TID) | ORAL | 3 refills | Status: DC

## 2018-01-31 MED ORDER — DULOXETINE HCL 30 MG PO CPEP: 30.0000 mg | ORAL_CAPSULE | Freq: Every day | ORAL | 3 refills | Status: DC

## 2018-01-31 MED ORDER — METHYLPREDNISOLONE ACETATE 80 MG/ML IJ SUSP: 80.0000 mg | Freq: Once | INTRAMUSCULAR | Status: DC

## 2018-01-31 MED ORDER — CYCLOBENZAPRINE HCL 5 MG PO TABS: 5.0000 mg | ORAL_TABLET | Freq: Three times a day (TID) | ORAL | 1 refills | Status: DC | PRN

## 2018-01-31 MED ORDER — FLUTICASONE-SALMETEROL 250-50 MCG/DOSE IN AEPB: 1.0000 | INHALATION_SPRAY | Freq: Two times a day (BID) | RESPIRATORY_TRACT | 11 refills | Status: DC

## 2018-01-31 MED ORDER — POTASSIUM CHLORIDE ER 10 MEQ PO TBCR: 10.0000 meq | EXTENDED_RELEASE_TABLET | Freq: Every day | ORAL | 0 refills | Status: DC

## 2018-01-31 NOTE — Assessment & Plan Note (Signed)
Ok for gabapentin trial, and pt to d/c the tramadol if he thinks not effective;  I do not treat chronic pain with higher narcotics, consider pain management if not helped with ortho f/u as planned

## 2018-01-31 NOTE — Assessment & Plan Note (Signed)
Atypical, most likely related to asthma, but given risk factors will check ecg, cxr, and refer stress testing

## 2018-01-31 NOTE — Assessment & Plan Note (Signed)
Acute on chronic, improved with irrigation

## 2018-01-31 NOTE — Progress Notes (Signed)
Subjective:    Patient ID: [name] [name], male    DOB: 02/04/1951, 67 y.o.   MRN: [id number]  HPI  Here with "several issues where I thought to see you"; verbose again and will not let me ask questions - per pt he relates in a rambling manner though appears to be a bit less nervous than in past visits -  Pt continues to have recurring right LBP without bowel or bladder change, fever, wt loss,  worsening LE pain/numbness/weakness, gait change or falls, s/p ESI about 2 wks ago per ortho to right lumbar, but did not seem to help, so they suggested a right SI joint injection with fluoroscopy, but he canceled, as his left sided ? spasm type pain has been worse per pt.  Overall with all the current pain to the back, as well as the right knee, also has some numbness to the right foot after a neuroma surgury, but he wondering about sciatica as well since he has pain to right lower back, right upper leg (mostly lateral and medial).  Had been prescribed muscle relaxer per ortho refilled recently 5.22 but felt he had chest congestion, tightness and sob and dizziness that seemed to occur with muscle relaxer, tramadol and also tylenol, thinks may have happened more this psat week due to the summer heat as well.  No fever, cough, wheezing.  Thinks he needs to hold on the tramadol and muscle relaxer during the day at least.  Has hx of asthma, has tried the adavir and albuterol he has leftover but has not refilled recently due to cost.  Denies worsening depressive symptoms, suicidal ideation, or panic; has ongoing anxiety, not increased recently.  Also realizes some of this may be somatizism, but also wondering if he needs to see cardiology as well or other testing. Past Medical History:  Diagnosis Date  . Allergic rhinitis   . Allergy    SEASONAL,USE TO TAKE ALLERGY SHOTS  . Anxiety   . Arthritis    BACK,KNEES  . Asthma, mild   . Borderline diabetes mellitus   . Depression   . Diverticulosis of colon   .  Gallstones   . Generalized anxiety disorder   . GERD (gastroesophageal reflux disease)   . Hemorrhoids   . History of colonic polyps   . Hyperlipidemia   . Hypertension   . Lumbar back pain   . Obesity   . Venous insufficiency    Past Surgical History:  Procedure Laterality Date  . COLONOSCOPY    . LUMBAR FUSION    . LUMBAR LAMINECTOMY  [id number]  . POLYPECTOMY      reports that he has never smoked. He has never used smokeless tobacco. He reports that he does not drink alcohol or use drugs. family history includes Colon cancer in his father and maternal grandfather; Diabetes in his father; Heart attack in his father; Lung cancer in his mother; Prostate cancer in his father; Stroke in his father. No Known Allergies Current Outpatient Medications on File Prior to Visit  Medication Sig Dispense Refill  . albuterol (PROVENTIL HFA;VENTOLIN HFA) 108 (90 Base) MCG/ACT inhaler Inhale 2 puffs into the lungs every 6 (six) hours as needed for wheezing or shortness of breath. 1 Inhaler 5  . ALPRAZolam (XANAX) 0.5 MG tablet TAKE 1 TABLET THREE TIMES DAILY AS NEEDED FOR ANXIETY  OR  SLEEP 90 tablet 2  . amLODipine (NORVASC) 5 MG tablet Take 1 tablet (5 mg total) by mouth daily. 90 tablet  3  . aspirin 81 MG tablet Take 81 mg by mouth daily.      . fluticasone (FLONASE) 50 MCG/ACT nasal spray Place 2 sprays into both nostrils daily. 18.2 g 11  . Fluticasone-Salmeterol (ADVAIR DISKUS) 250-50 MCG/DOSE AEPB Inhale 1 puff into the lungs 2 (two) times daily. 60 each 11  . losartan (COZAAR) 50 MG tablet Take 1 tablet (50 mg total) by mouth daily. 90 tablet 3  . omeprazole (PRILOSEC) 20 MG capsule TAKE 2 CAPSULES EVERY DAY 180 capsule 1  . omeprazole (PRILOSEC) 40 MG capsule Take 40 mg by mouth daily.    . Sodium Chloride (NASAL MIST IN) Inhale into the lungs.    Manus Gunning BOWEL PREP KIT 17.5-3.13-1.6 GM/180ML SOLN Take 1 each by mouth once.     . traMADol (ULTRAM) 50 MG tablet Take 1 tablet (50 mg total)  by mouth every 8 (eight) hours as needed. To fill December 15, 2017 90 tablet 1   No current facility-administered medications on file prior to visit.    Review of Systems  Constitutional: Negative for other unusual diaphoresis or sweats HENT: Negative for ear discharge or swelling Eyes: Negative for other worsening visual disturbances Respiratory: Negative for stridor or other swelling  Gastrointestinal: Negative for worsening distension or other blood Genitourinary: Negative for retention or other urinary change Musculoskeletal: Negative for other MSK pain or swelling Skin: Negative for color change or other new lesions Neurological: Negative for worsening tremors and other numbness  Psychiatric/Behavioral: Negative for worsening agitation or other fatigue All other system neg per pt    Objective:   Physical Exam BP 112/70   Pulse 72   Temp 98.1 F (36.7 C) (Oral)   Ht 6' 2" (1.88 m)   Wt 240 lb (108.9 kg)   SpO2 96%   BMI 30.81 kg/m  VS noted,  Constitutional: Pt appears in NAD HENT: Head: NCAT.  Right Ear: External ear normal.  Left Ear: External ear normal.   Bilat ear canal impactions resolved with irrigation Eyes: . Pupils are equal, round, and reactive to light. Conjunctivae and EOM are normal Nose: without d/c or deformity Neck: Neck supple. Gross normal ROM Cardiovascular: Normal rate and regular rhythm.   Pulmonary/Chest: Effort normal and breath sounds decreased without rales but with few scattered wheezing.  Abd:  Soft, NT, ND, + BS, no organomegaly Spine nontender in midline, + left lumbar paravertebral tender Neurological: Pt is alert. At baseline orientation, motor grossly intact Skin: Skin is warm. No rashes, other new lesions, no LE edema Psychiatric: Pt behavior is normal without agitation  No other exam findings Lab Results  Component Value Date   WBC 11.5 (H) 11/29/2017   HGB 16.2 11/29/2017   HCT 47.4 11/29/2017   PLT 297.0 11/29/2017   GLUCOSE  115 (H) 11/29/2017   CHOL 110 11/29/2017   TRIG 120.0 11/29/2017   HDL 29.00 (L) 11/29/2017   LDLDIRECT 75.0 01/06/2017   LDLCALC 57 11/29/2017   ALT 20 11/29/2017   AST 14 11/29/2017   NA 138 11/29/2017   K 3.8 11/29/2017   CL 100 11/29/2017   CREATININE 0.89 11/29/2017   BUN 9 11/29/2017   CO2 27 11/29/2017   TSH 1.43 11/29/2017   PSA 1.17 11/29/2017   INR 1.20 12/06/2015   HGBA1C 6.0 11/29/2017   ECG today I have personally interpreted NSR 79     Assessment & Plan:

## 2018-01-31 NOTE — Assessment & Plan Note (Signed)
Ok to cont the xanax as rx, but also add cymbalta 30 qd which hopefully to help the pain as well

## 2018-01-31 NOTE — Patient Instructions (Addendum)
Your ears were irrigated of wax today  Your EKG was OK today  You had the steroid shot today  OK to change the tizanidine to flexeril 5 mg as needed  Please take all new medication as prescribed  - the gabapentin 100 mg three times daily for the right side pain to see if can improve  The gabapentin 100 mg can be increased to 300 mg three times per day if you are not improved in 1 wk  Please take all new medication as prescribed - the Cymbalta 30 mg per day  Please continue all other medications as before, and refills have been done if requested including to restart the advair and albuterol  Please have the pharmacy call with any other refills you may need.  Please continue your efforts at being more active, low cholesterol diet, and weight control  Please keep your appointments with your specialists as you may have planned  You will be contacted regarding the referral for: stress test  Please go to the XRAY Department in the Basement (go straight as you get off the elevator) for the x-ray testing  Please go to the LAB in the Basement (turn left off the elevator) for the tests to be done today  You will be contacted by phone if any changes need to be made immediately.  Otherwise, you will receive a letter about your results with an explanation, but please check with MyChart first.

## 2018-01-31 NOTE — Assessment & Plan Note (Signed)
Today is c/w msk spasm most likely, ok to change the tizanidine he does not tolerate well, to flexeril 5 prn

## 2018-01-31 NOTE — Assessment & Plan Note (Addendum)
Incidental today  - for depomedrol IM 80, restart daily use of advair and albuterol prn,  to f/u any worsening symptoms or concerns  Note:  Total time for pt hx, exam, review of record with pt in the room, determination of diagnoses and plan for further eval and tx is > 40 min, with over 50% spent in coordination and counseling of patient including the differential dx, tx, further evaluation and other management of asthma exacerbation, left lumbar msk spasm, right sciatica, bilat hearing reduced, CP and anxiety

## 2018-01-31 NOTE — Telephone Encounter (Signed)
-----   Message from Biagio Borg, MD sent at 01/31/2018 12:32 PM EDT ----- Left message on MyChart, pt to cont same tx except  The test results show that your current treatment is OK, except the potassium is slightly low.  We should treat with a few days of potassium, so this will be sent to your pharmacy, and you should hear from the office as well.Redmond Baseman to please inform pt, I will do rx

## 2018-01-31 NOTE — Telephone Encounter (Signed)
Pt has viewed results via MyChart  

## 2018-02-09 ENCOUNTER — Other Ambulatory Visit: Payer: Self-pay | Admitting: Internal Medicine

## 2018-02-09 NOTE — Telephone Encounter (Signed)
Copied from [place] [id number]. Topic: Quick Communication - Rx Refill/Question >> Feb 09, 2018  7:30 PM [name], NT wrote: Medication:  ALPRAZolam [name]) 0.5 MG tablet   Has the patient contacted their pharmacy? {yes  (Agent: If no, request that the patient contact the pharmacy for the refill. (Agent: If yes, when and what did the pharmacy advise?  Preferred Pharmacy (with phone number or street [place], [place] [phone] (Phone) [phone] (Fax      Agent: Please be advised that RX refills may take up to 3 business days. We ask that you follow-up with your pharmacy.

## 2018-02-10 MED ORDER — ALPRAZOLAM 0.5 MG PO TABS
ORAL_TABLET | ORAL | 2 refills | Status: DC
Start: 2018-02-10 — End: 2018-05-10

## 2018-02-10 NOTE — Telephone Encounter (Signed)
LOV  01/31/18 Dr. [name] Last refill 11/08/17  # 64 with 2 refills

## 2018-02-10 NOTE — Telephone Encounter (Signed)
Done erx 

## 2018-02-23 ENCOUNTER — Other Ambulatory Visit (INDEPENDENT_AMBULATORY_CARE_PROVIDER_SITE_OTHER): Payer: Medicare HMO

## 2018-02-23 ENCOUNTER — Ambulatory Visit (INDEPENDENT_AMBULATORY_CARE_PROVIDER_SITE_OTHER): Payer: Medicare HMO | Admitting: Internal Medicine

## 2018-02-23 ENCOUNTER — Encounter: Payer: Self-pay | Admitting: Internal Medicine

## 2018-02-23 VITALS — BP 110/76 | HR 106 | Ht 74.0 in | Wt 239.0 lb

## 2018-02-23 DIAGNOSIS — M5441 Lumbago with sciatica, right side: Secondary | ICD-10-CM | POA: Diagnosis not present

## 2018-02-23 DIAGNOSIS — R0600 Dyspnea, unspecified: Secondary | ICD-10-CM

## 2018-02-23 DIAGNOSIS — G8929 Other chronic pain: Secondary | ICD-10-CM

## 2018-02-23 LAB — URINALYSIS, ROUTINE W REFLEX MICROSCOPIC
BILIRUBIN URINE: NEGATIVE
HGB URINE DIPSTICK: NEGATIVE
Ketones, ur: NEGATIVE
LEUKOCYTES UA: NEGATIVE
NITRITE: NEGATIVE
PH: 6 (ref 5.0–8.0)
RBC / HPF: NONE SEEN (ref 0–?)
Specific Gravity, Urine: 1.025 (ref 1.000–1.030)
Total Protein, Urine: NEGATIVE
Urine Glucose: NEGATIVE
Urobilinogen, UA: 0.2 (ref 0.0–1.0)

## 2018-02-23 LAB — BRAIN NATRIURETIC PEPTIDE: Pro B Natriuretic peptide (BNP): 9 pg/mL (ref 0.0–100.0)

## 2018-02-23 NOTE — Patient Instructions (Signed)
Please continue all other medications as before, and refills have been done if requested.  Please have the pharmacy call with any other refills you may need.  Please keep your appointments with your specialists as you may have planned  Please go to the LAB in the Basement (turn left off the elevator) for the tests to be done today - just the urine testing today  You will be contacted by phone if any changes need to be made immediately.  Otherwise, you will receive a letter about your results with an explanation, but please check with MyChart first.  Please remember to sign up for MyChart if you have not done so, as this will be important to you in the future with finding out test results, communicating by private email, and scheduling acute appointments online when needed.

## 2018-02-23 NOTE — Progress Notes (Signed)
Subjective:    Patient ID: [name] [name], male    DOB: [birth year], 67 y.o.   MRN: [id number]  HPI  "I think I may be having kidney problems."  Was seen June 3 for similar, and muscle relaxer changed. 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  Pt continues to have recurring LBP without bowel or bladder change, fever, wt loss,  worsening LE pain/numbness/weakness, gait change or falls, but pain was increased over the past weekend and spent most of the time in bed.  Denies urinary symptoms such as dysuria, frequency, urgency, flank pain, hematuria or n/v, fever, chills. Current left lbp with stabbing pain, worse to stand up  Denies urinary symptoms such as dysuria, frequency, urgency, flank pain, hematuria or n/v, fever, chills, except has some hesitancy with start urination Past Medical History:  Diagnosis Date  . Allergic rhinitis   . Allergy    SEASONAL,USE TO TAKE ALLERGY SHOTS  . Anxiety   . Arthritis    BACK,KNEES  . Asthma, mild   . Borderline diabetes mellitus   . Depression   . Diverticulosis of colon   . Gallstones   . Generalized anxiety disorder   . GERD (gastroesophageal reflux disease)   . Hemorrhoids   . History of colonic polyps   . Hyperlipidemia   . Hypertension   . Lumbar back pain   . Obesity   . Venous insufficiency    Past Surgical History:  Procedure Laterality Date  . COLONOSCOPY    . LUMBAR FUSION    . LUMBAR LAMINECTOMY  [id number]  . POLYPECTOMY      reports that he has never smoked. He has never used smokeless tobacco. He reports that he does not drink alcohol or use drugs. family history includes Colon cancer in his father and maternal grandfather; Diabetes in his father; Heart attack in his father; Lung cancer in his mother; Prostate cancer in his father; Stroke in his father. No Known Allergies . Review of Systems  Constitutional: Negative for other unusual diaphoresis or sweats HENT: Negative for ear discharge or swelling Eyes: Negative for other worsening visual disturbances Respiratory: Negative for stridor or other swelling  Gastrointestinal: Negative for worsening distension or other blood Genitourinary: Negative for retention or other urinary change Musculoskeletal: Negative for other MSK pain or swelling Skin: Negative for color change or other new lesions Neurological: Negative for worsening tremors and other numbness  Psychiatric/Behavioral: Negative for worsening agitation or other fatigue All othjer system neg per pt    Objective:   Physical Exam BP 110/76 (BP Location: Left Arm, Patient Position: Sitting, Cuff Size: Large)   Pulse (!) 106   Ht 6\' 2"  (1.88 m)   Wt 239 lb (108.4 kg)   SpO2 95%   BMI 30.69 kg/m  VS noted,  Constitutional: Pt appears in NAD HENT: Head: NCAT.  Right Ear: External ear normal.  Left Ear: External ear normal.  Eyes: . Pupils are equal, round, and reactive to light. Conjunctivae and EOM are normal Nose: without d/c or deformity Neck: Neck supple. Gross normal ROM Cardiovascular: Normal rate and regular rhythm.   Pulmonary/Chest: Effort normal and breath sounds without rales or wheezing.  Abd:  Soft, NT, ND, + BS, no organomegaly Spine nontender No right paralumbar tender + mild tender spasm to left lumbar paravertebral Neurological: Pt  is alert. At baseline orientation, motor 5/5 intact Skin: Skin is warm. No rashes, other new lesions, no LE edema Psychiatric: Pt behavior is normal without agitation  No other exam findings    Assessment & Plan:

## 2018-02-23 NOTE — Assessment & Plan Note (Signed)
With flare of left lumbar spasm vs underlying DJD or DDD, no sciatica or neuro changes, ok for UA to r/o other but cont same tx as before for now with pain control and muscle relaxer as needed

## 2018-02-24 MED ORDER — CYCLOBENZAPRINE HCL 5 MG PO TABS: 5.0000 mg | ORAL_TABLET | Freq: Three times a day (TID) | ORAL | 1 refills | Status: DC | PRN

## 2018-03-01 ENCOUNTER — Encounter (HOSPITAL_COMMUNITY): Payer: Self-pay | Admitting: Internal Medicine

## 2018-03-01 ENCOUNTER — Telehealth (HOSPITAL_COMMUNITY): Payer: Self-pay | Admitting: Internal Medicine

## 2018-03-01 NOTE — Telephone Encounter (Signed)
02/07/18 lmom for pt to return call to schedule stress test 02/09/18 Called pt and lmsg for him to CB to get sch for test..RG 02/14/18 Called pt and lmsg for him to Cb to get sch for test..RG 03/01/18 Called pt and lmsg for him to Cb to sch test..RG

## 2018-03-04 ENCOUNTER — Encounter: Payer: Self-pay | Admitting: Internal Medicine

## 2018-03-20 ENCOUNTER — Encounter: Payer: Self-pay | Admitting: Internal Medicine

## 2018-03-22 MED ORDER — TRAMADOL HCL 50 MG PO TABS: 50.0000 mg | ORAL_TABLET | Freq: Three times a day (TID) | ORAL | 1 refills | Status: DC | PRN

## 2018-04-05 ENCOUNTER — Telehealth: Payer: Self-pay | Admitting: Emergency Medicine

## 2018-04-05 NOTE — Telephone Encounter (Signed)
Called patient to schedule AWV. Pt declined at this time. 

## 2018-04-28 ENCOUNTER — Telehealth: Payer: Self-pay

## 2018-04-28 MED ORDER — LOSARTAN POTASSIUM 50 MG PO TABS: 50.0000 mg | ORAL_TABLET | Freq: Every day | ORAL | 1 refills | Status: DC

## 2018-04-28 NOTE — Telephone Encounter (Signed)
Needs refill on losartan sent to [facility]. Also wanted to let Dr. [name] know that he is seeing an ENT for constant ear wax build up. Please let pt know when rx is sent.

## 2018-05-04 IMAGING — US US ABDOMEN COMPLETE
1 series · 13 of 25 positions shown · non-contrast
Comparison: CT, 04/01/2008

CLINICAL DATA: Epigastric pain. Indigestion. Constipation for 1
month.

EXAM:
ABDOMEN ULTRASOUND COMPLETE

[Series 1: us abdomen complete · 0.22mm/px · 13 of 75 slices shown]
[im 1/75]
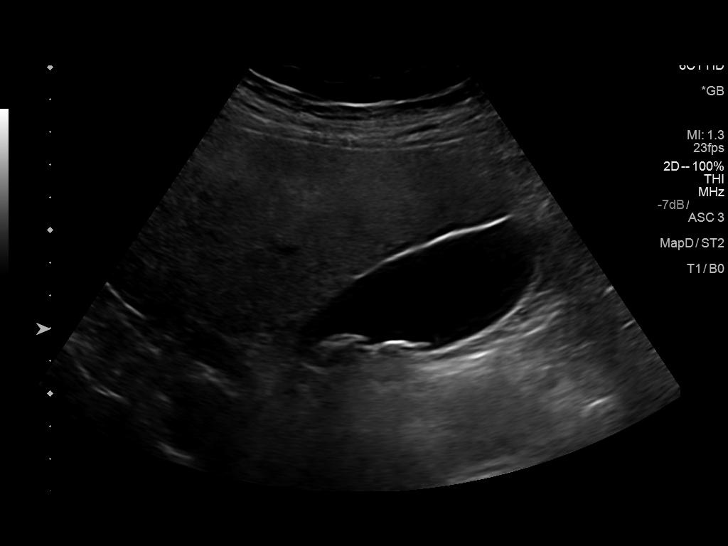
[im 7/75]
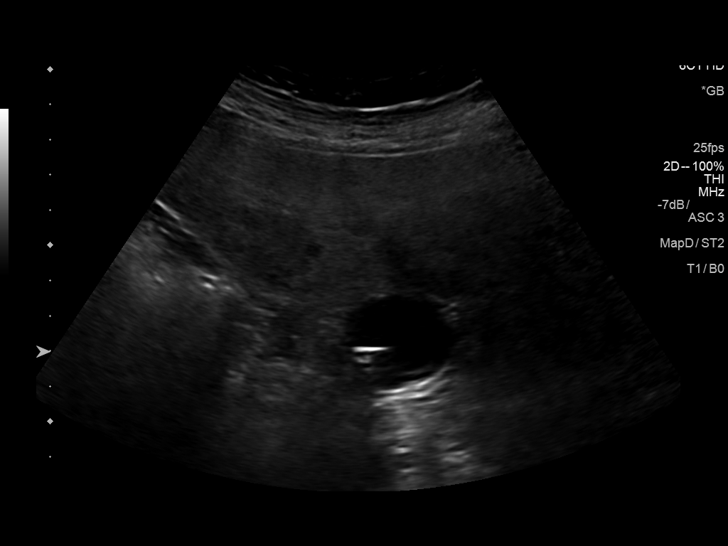
[im 13/75]
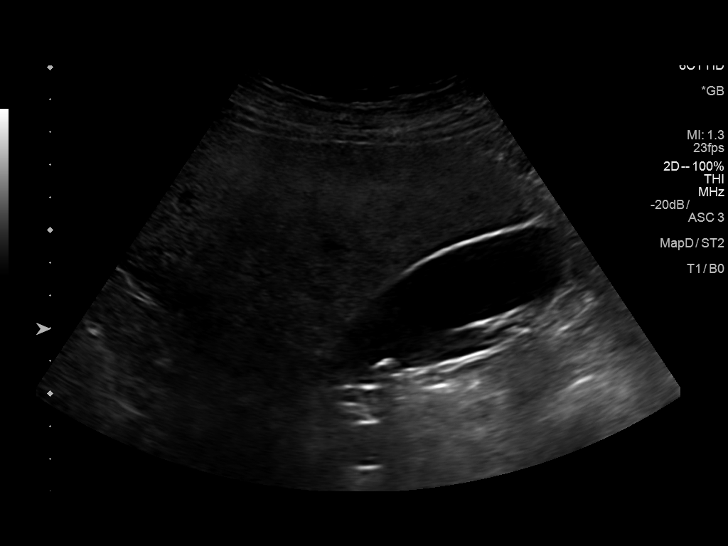
[im 19/75]
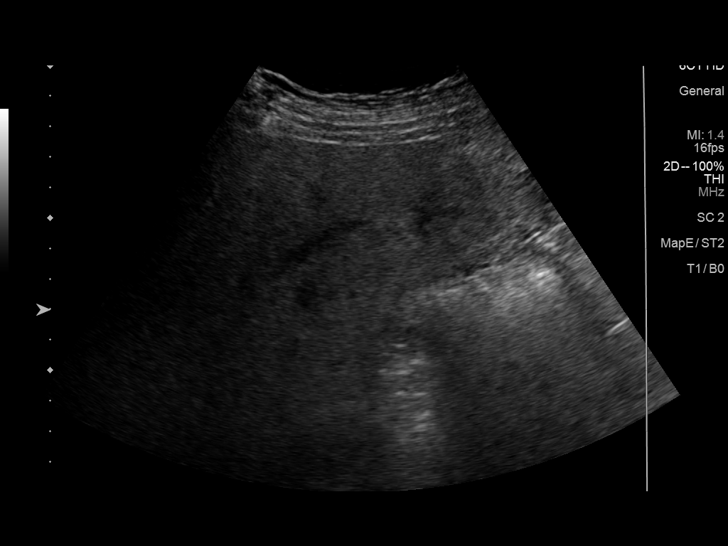
[im 25/75]
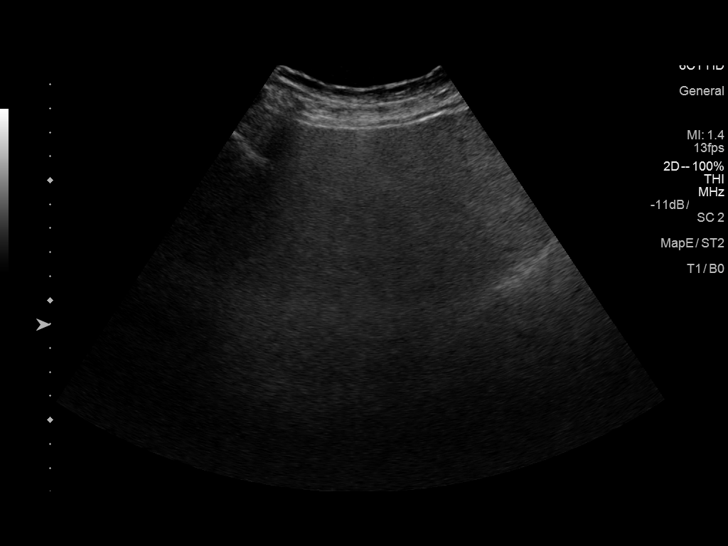
[im 31/75]
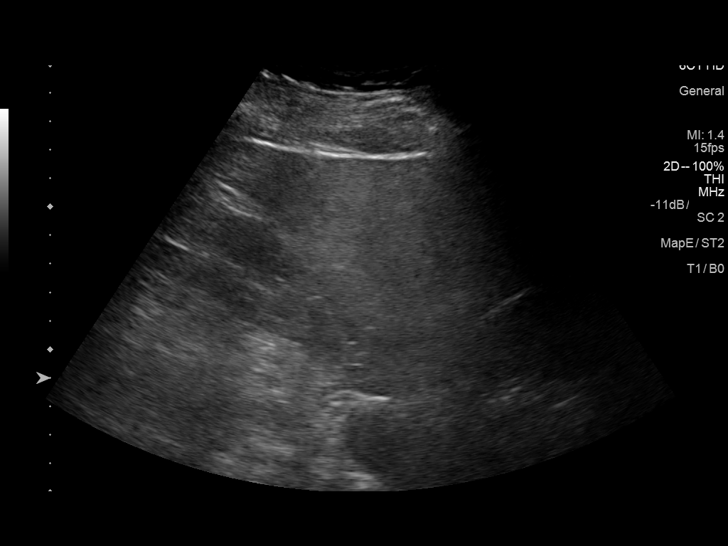
[im 38/75]
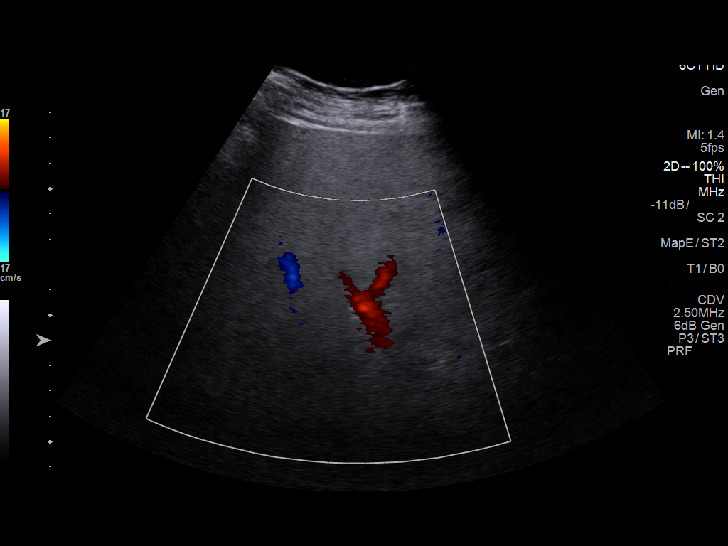
[im 44/75]
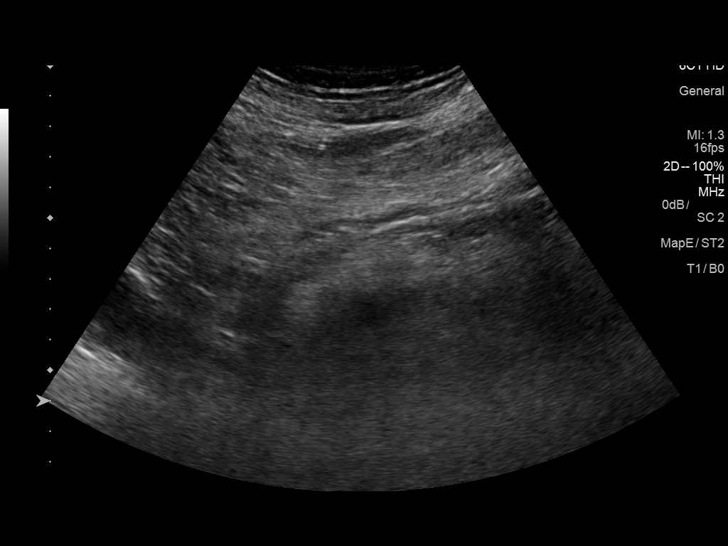
[im 50/75]
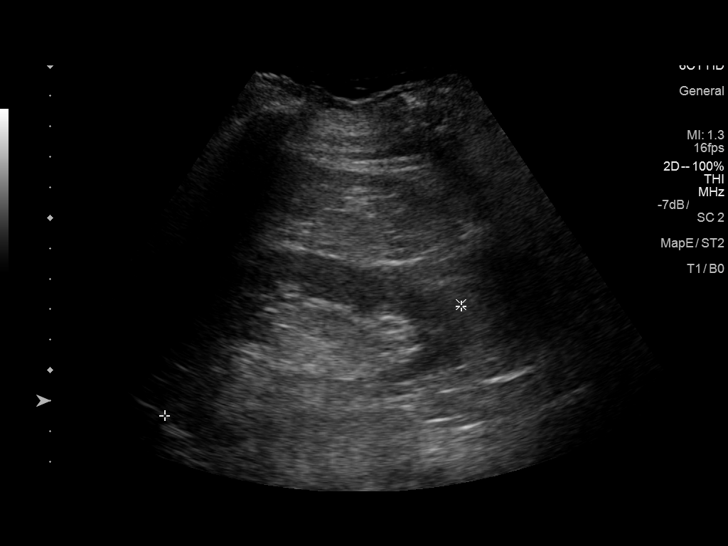
[im 56/75]
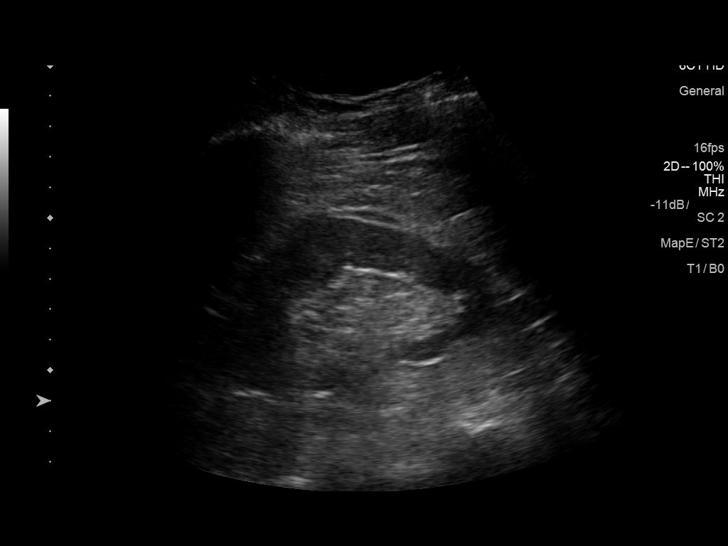
[im 62/75]
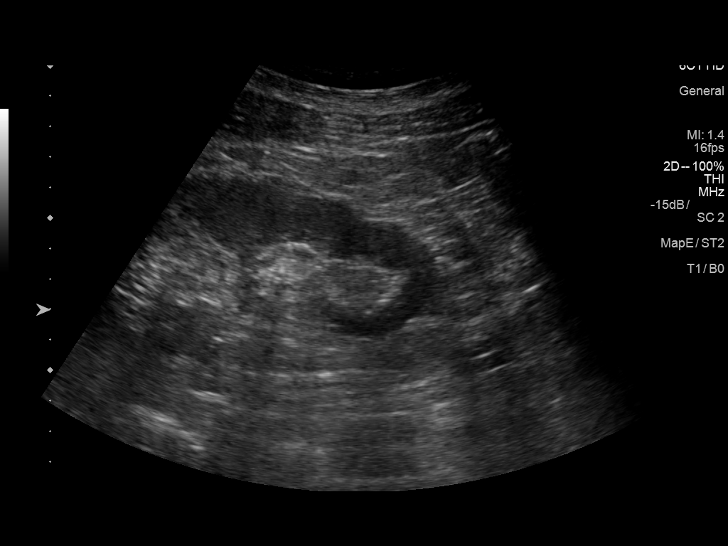
[im 68/75]
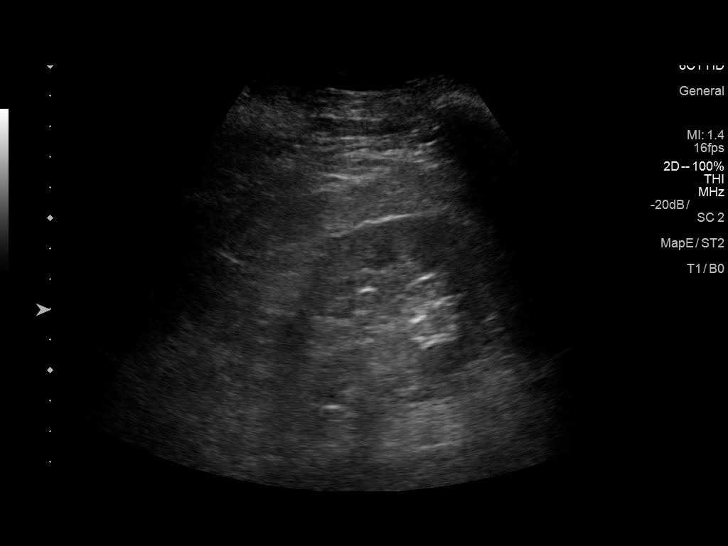
[im 75/75]
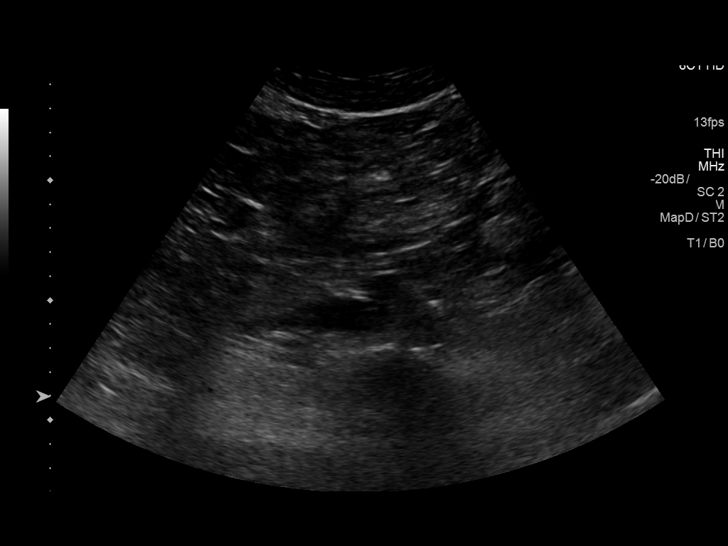

[13 of 25 positions shown; findings below may reference images not displayed]

FINDINGS: Gallbladder: Multiple mobile gallstones. No gallbladder wall
thickening or pericholecystic fluid. No evidence of acute
cholecystitis. Largest stone measures 1.8 cm.

Common bile duct: Diameter: 4 mm

Liver: Coarsened heterogeneous echotexture with overall increased
echogenicity. No focal liver mass or lesion. Hepatopetal flow was
noted in the portal vein.

IVC: Not visualized due to midline bowel gas.

Pancreas: Limited visualization due to midline bowel gas. No gross
abnormality.

Spleen: Size and appearance within normal limits.

Right Kidney: Length: 10.4 cm. Echogenicity within normal limits. No
mass or hydronephrosis visualized.

Left Kidney: Length: 12.0 cm. Echogenicity within normal limits. No
mass or hydronephrosis visualized.

Abdominal aorta: No aneurysm visualized.

Other findings: None.
IMPRESSION: 1. No acute findings.
2. Cholelithiasis with no evidence of acute cholecystitis. No bile
duct dilation.
3. Abnormal appearance of the liver consistent with hepatic
steatosis. No focal liver mass. Hepatopetal flow in the portal vein.

## 2018-05-06 DIAGNOSIS — J301 Allergic rhinitis due to pollen: Secondary | ICD-10-CM | POA: Diagnosis not present

## 2018-05-06 DIAGNOSIS — J302 Other seasonal allergic rhinitis: Secondary | ICD-10-CM | POA: Diagnosis not present

## 2018-05-06 DIAGNOSIS — H903 Sensorineural hearing loss, bilateral: Secondary | ICD-10-CM | POA: Diagnosis not present

## 2018-05-06 DIAGNOSIS — H6062 Unspecified chronic otitis externa, left ear: Secondary | ICD-10-CM | POA: Diagnosis not present

## 2018-05-06 DIAGNOSIS — H6122 Impacted cerumen, left ear: Secondary | ICD-10-CM | POA: Diagnosis not present

## 2018-05-10 ENCOUNTER — Telehealth: Payer: Self-pay | Admitting: Internal Medicine

## 2018-05-10 MED ORDER — ALPRAZOLAM 0.5 MG PO TABS: ORAL_TABLET | ORAL | 2 refills | Status: DC

## 2018-05-10 NOTE — Addendum Note (Signed)
Addended by: Biagio Borg on: 05/10/2018 12:30 PM   Modules accepted: Orders

## 2018-05-10 NOTE — Telephone Encounter (Signed)
Done erx 

## 2018-05-10 NOTE — Telephone Encounter (Signed)
Patient is requesting refill on alprazolam to be sent to [facility].

## 2018-05-11 DIAGNOSIS — H6063 Unspecified chronic otitis externa, bilateral: Secondary | ICD-10-CM | POA: Diagnosis not present

## 2018-05-11 DIAGNOSIS — H6121 Impacted cerumen, right ear: Secondary | ICD-10-CM | POA: Diagnosis not present

## 2018-05-25 ENCOUNTER — Other Ambulatory Visit: Payer: Self-pay | Admitting: Internal Medicine

## 2018-05-25 MED ORDER — TRAMADOL HCL 50 MG PO TABS: 50.0000 mg | ORAL_TABLET | Freq: Three times a day (TID) | ORAL | 1 refills | Status: DC | PRN

## 2018-05-25 NOTE — Telephone Encounter (Signed)
Done erx 

## 2018-05-25 NOTE — Telephone Encounter (Signed)
Tramadol refill Last Refill: 03/22/18; # 90, RF x 1 Last OV: 02/23/18 PCP: Dr. [name] [facility]acy: [facility] [address].

## 2018-05-25 NOTE — Telephone Encounter (Signed)
MD approved and sent electronically to pof../lmb  

## 2018-05-25 NOTE — Telephone Encounter (Signed)
Copied from [place] [id number]. Topic: Quick Communication - See Telephone Encounter >> May 25, 2018 10:20 AM Ivar Drape wrote: CRM for notification. See Telephone encounter for: 05/25/18. Patient would like a refill on his traMADol (ULTRAM) 50 MG tablet medication and have it sent to his preferred pharmacy [facility] on [address].

## 2018-06-01 ENCOUNTER — Encounter: Payer: Self-pay | Admitting: Internal Medicine

## 2018-06-01 ENCOUNTER — Ambulatory Visit (HOSPITAL_COMMUNITY)
Admission: RE | Admit: 2018-06-01 | Discharge: 2018-06-01 | Disposition: A | Discharge status: Home / Self Care | Payer: Medicare HMO | Source: Ambulatory Visit | Attending: Internal Medicine | Admitting: Internal Medicine

## 2018-06-01 ENCOUNTER — Ambulatory Visit (INDEPENDENT_AMBULATORY_CARE_PROVIDER_SITE_OTHER): Payer: Medicare HMO | Admitting: Internal Medicine

## 2018-06-01 VITALS — BP 116/78 | HR 77 | Temp 98.1°F | Ht 74.0 in | Wt 247.0 lb

## 2018-06-01 DIAGNOSIS — Z0000 Encounter for general adult medical examination without abnormal findings: Secondary | ICD-10-CM | POA: Diagnosis not present

## 2018-06-01 DIAGNOSIS — R7303 Prediabetes: Secondary | ICD-10-CM

## 2018-06-01 DIAGNOSIS — E785 Hyperlipidemia, unspecified: Secondary | ICD-10-CM

## 2018-06-01 DIAGNOSIS — M7989 Other specified soft tissue disorders: Secondary | ICD-10-CM | POA: Diagnosis not present

## 2018-06-01 DIAGNOSIS — I10 Essential (primary) hypertension: Secondary | ICD-10-CM | POA: Diagnosis not present

## 2018-06-01 DIAGNOSIS — Z23 Encounter for immunization: Secondary | ICD-10-CM

## 2018-06-01 LAB — POCT GLYCOSYLATED HEMOGLOBIN (HGB A1C)
HEMOGLOBIN A1C: 6 % — AB (ref 4.0–5.6)
HbA1c POC (<> result, manual entry): 0 % (ref 4.0–5.6)
HbA1c, POC (controlled diabetic range): 0 % (ref 0.0–7.0)
HbA1c, POC (prediabetic range): 0 % — AB (ref 5.7–6.4)

## 2018-06-01 NOTE — Progress Notes (Signed)
Left lower extremity venous duplex completed - There is no evidence of a DVT or Baker's cyst. Arieana Somoza,RVS 06/01/2018, 3:52 PM

## 2018-06-01 NOTE — Assessment & Plan Note (Signed)
stable overall by history and exam, recent data reviewed with pt, and pt to continue medical treatment as before,  to f/u any worsening symptoms or concerns  

## 2018-06-01 NOTE — Assessment & Plan Note (Signed)
Cant r/o dvt vs venous insufficiency, BNP normal June 2019 - for LLE venous doppler

## 2018-06-01 NOTE — Progress Notes (Addendum)
Subjective:    Patient ID: [name] [name], male    DOB: December 06, 1950, 67 y.o.   MRN: [id number]  HPI  Here to f/u; overall doing ok,  Pt denies chest pain, increasing sob or doe, wheezing, orthopnea, PND, palpitations, dizziness or syncope.  Pt denies new neurological symptoms such as new headache, or facial or extremity weakness or numbness.  Pt denies polydipsia, polyuria, or low sugar episode.  Pt states overall good compliance with meds, mostly trying to follow appropriate diet, with wt overall stable,  but little exercise however.   Has new onset 2 -3 mo persistent LLE swelling below the knee with mild calf crampy pain.No hx of dvt. Takes asa regularly   Past Medical History:  Diagnosis Date  . Allergic rhinitis   . Allergy    SEASONAL,USE TO TAKE ALLERGY SHOTS  . Anxiety   . Arthritis    BACK,KNEES  . Asthma, mild   . Borderline diabetes mellitus   . Depression   . Diverticulosis of colon   . Gallstones   . Generalized anxiety disorder   . GERD (gastroesophageal reflux disease)   . Hemorrhoids   . History of colonic polyps   . Hyperlipidemia   . Hypertension   . Lumbar back pain   . Obesity   . Venous insufficiency    Past Surgical History:  Procedure Laterality Date  . COLONOSCOPY    . LUMBAR FUSION    . LUMBAR LAMINECTOMY  [id number]  . POLYPECTOMY      reports that he has never smoked. He has never used smokeless tobacco. He reports that he does not drink alcohol or use drugs. family history includes Colon cancer in his father and maternal grandfather; Diabetes in his father; Heart attack in his father; Lung cancer in his mother; Prostate cancer in his father; Stroke in his father. No Known Allergies Current Outpatient Medications on File Prior to Visit  Medication Sig Dispense Refill  . albuterol (PROVENTIL HFA;VENTOLIN HFA) 108 (90 Base) MCG/ACT inhaler Inhale 2 puffs into the lungs every 6 (six) hours as needed for wheezing or shortness of breath. 1 Inhaler 5  .  ALPRAZolam (XANAX) 0.5 MG tablet TAKE 1 TABLET THREE TIMES DAILY AS NEEDED FOR ANXIETY  OR  SLEEP 90 tablet 2  . amLODipine (NORVASC) 5 MG tablet Take 1 tablet (5 mg total) by mouth daily. 90 tablet 3  . aspirin 81 MG tablet Take 81 mg by mouth daily.      . cyclobenzaprine (FLEXERIL) 5 MG tablet Take 1 tablet (5 mg total) by mouth 3 (three) times daily as needed for muscle spasms. 60 tablet 1  . DULoxetine (CYMBALTA) 30 MG capsule Take 1 capsule (30 mg total) by mouth daily. 90 capsule 3  . fluticasone (FLONASE) 50 MCG/ACT nasal spray Place 2 sprays into both nostrils daily. 18.2 g 11  . Fluticasone-Salmeterol (ADVAIR DISKUS) 250-50 MCG/DOSE AEPB Inhale 1 puff into the lungs 2 (two) times daily. 60 each 11  . gabapentin (NEURONTIN) 100 MG capsule Take 1 capsule (100 mg total) by mouth 3 (three) times daily. 90 capsule 3  . losartan (COZAAR) 50 MG tablet Take 1 tablet (50 mg total) by mouth daily. 90 tablet 1  . omeprazole (PRILOSEC) 20 MG capsule TAKE 2 CAPSULES EVERY DAY 180 capsule 1  . omeprazole (PRILOSEC) 40 MG capsule Take 40 mg by mouth daily.    . Sodium Chloride (NASAL MIST IN) Inhale into the lungs.    Manus Gunning BOWEL  PREP KIT 17.5-3.13-1.6 GM/180ML SOLN Take 1 each by mouth once.     . traMADol (ULTRAM) 50 MG tablet Take 1 tablet (50 mg total) by mouth every 8 (eight) hours as needed. 90 tablet 1  . potassium chloride (K-DUR) 10 MEQ tablet Take 1 tablet (10 mEq total) by mouth daily for 5 days. 5 tablet 0   Current Facility-Administered Medications on File Prior to Visit  Medication Dose Route Frequency Provider Last Rate Last Dose  . methylPREDNISolone acetate (DEPO-MEDROL) injection 80 mg  80 mg Intramuscular Once Biagio Borg, MD       Review of Systems  Constitutional: Negative for other unusual diaphoresis or sweats HENT: Negative for ear discharge or swelling Eyes: Negative for other worsening visual disturbances Respiratory: Negative for stridor or other swelling    Gastrointestinal: Negative for worsening distension or other blood Genitourinary: Negative for retention or other urinary change Musculoskeletal: Negative for other MSK pain or swelling Skin: Negative for color change or other new lesions Neurological: Negative for worsening tremors and other numbness  Psychiatric/Behavioral: Negative for worsening agitation or other fatigue All other system neg per pt    Objective:   Physical Exam BP 116/78   Pulse 77   Temp 98.1 F (36.7 C) (Oral)   Ht 6' 2" (1.88 m)   Wt 247 lb (112 kg)   SpO2 96%   BMI 31.71 kg/m  VS noted,  Constitutional: Pt appears in NAD HENT: Head: NCAT.  Right Ear: External ear normal.  Left Ear: External ear normal.  Eyes: . Pupils are equal, round, and reactive to light. Conjunctivae and EOM are normal Nose: without d/c or deformity Neck: Neck supple. Gross normal ROM Cardiovascular: Normal rate and regular rhythm.   Pulmonary/Chest: Effort normal and breath sounds without rales or wheezing.  Abd:  Soft, NT, ND, + BS, no organomegaly Neurological: Pt is alert. At baseline orientation, motor grossly intact Skin: Skin is warm. No rashes, other new lesions, has LLE edema 1+ to left knee Psychiatric: Pt behavior is normal without agitation  No other exam findings Lab Results  Component Value Date   WBC 8.1 01/31/2018   HGB 16.5 01/31/2018   HCT 47.2 01/31/2018   PLT 270.0 01/31/2018   GLUCOSE 107 (H) 01/31/2018   CHOL 110 11/29/2017   TRIG 120.0 11/29/2017   HDL 29.00 (L) 11/29/2017   LDLDIRECT 75.0 01/06/2017   LDLCALC 57 11/29/2017   ALT 18 01/31/2018   AST 13 01/31/2018   NA 141 01/31/2018   K 3.3 (L) 01/31/2018   CL 102 01/31/2018   CREATININE 1.00 01/31/2018   BUN 8 01/31/2018   CO2 29 01/31/2018   TSH 1.43 11/29/2017   PSA 1.17 11/29/2017   INR 1.20 12/06/2015   HGBA1C 6.0 11/29/2017   Contains abnormal data POCT glycosylated hemoglobin (Hb A1C)  Order: [id number]  Status:  Final result  Visible to patient:  No (Not Released) Dx:  Pre-diabetes   Ref Range & Units 14:05 17moago 114yrgo 2y15yro  Hemoglobin A1C 4.0 - 5.6 % 6.0Abnormal   6.0 R, CM 6.1 R, CM 5.5 R, CM           Assessment & Plan:

## 2018-06-01 NOTE — Patient Instructions (Addendum)
You had the flu shot and the pneumovax pneumonia shots today  Your A1c was OK today  You will be contacted regarding the referral for: Left leg vein test for blood clot (to see [facility] now)  Please continue all other medications as before, and refills have been done if requested.  Please have the pharmacy call with any other refills you may need.  Please continue your efforts at being more active, low cholesterol diet, and weight control.  Please keep your appointments with your specialists as you may have planned  Please return in 6 months, or sooner if needed, with Lab testing done 3-5 days before

## 2018-06-13 ENCOUNTER — Other Ambulatory Visit: Payer: Self-pay | Admitting: Internal Medicine

## 2018-06-13 ENCOUNTER — Telehealth: Payer: Self-pay | Admitting: Internal Medicine

## 2018-06-13 NOTE — Telephone Encounter (Signed)
Copied from [place] [id number]. Topic: Quick Communication - Rx Refill/Question >> Jun 13, 2018  3:46 PM Reyne Dumas L wrote: Medication:  omeprazole (PRILOSEC) 20 MG capsule Please remove 40mg  from his account  Has the patient contacted their pharmacy?   Pt states that he is taking these as needed, not as written on the script.  Pt states that he is taking one a day and one more if he is feeling bad.  Pt states that he is completely out, but pharmacy is telling him he is out of refills.  Pt needs a 90 day supply called in. (Agent: If no, request that the patient contact the pharmacy for the refill.) (Agent: If yes, when and what did the pharmacy advise?)  Preferred Pharmacy (with phone number or street name): [place], [place] [phone] (Phone) [phone] (Fax)  Agent: Please be advised that RX refills may take up to 3 business days. We ask that you follow-up with your pharmacy.

## 2018-06-13 NOTE — Telephone Encounter (Signed)
Copied from [place] [id number]. Topic: Quick Communication - Rx Refill/Question >> Jun 13, 2018  3:56 PM Reyne Dumas L wrote: Medication:  Pt states that he tried to update his account prior to his appointment with Dr. [name], but that they are still showing on MyChart.  Pt states that he is no longer taking the following medications: cyclobenzaprine (FLEXERIL) 5 MG tablet DULoxetine (CYMBALTA) 30 MG capsule fluticasone (FLONASE) 50 MCG/ACT nasal spray gabapentin (NEURONTIN) 100 MG capsule potassium chloride (K-DUR) 10 MEQ tablet Sodium Chloride (NASAL MIST IN)

## 2018-06-13 NOTE — Telephone Encounter (Signed)
Corrected.   List was accurate on the medications tab but not on meds & orders tab.

## 2018-06-14 MED ORDER — OMEPRAZOLE 20 MG PO CPDR: 40.0000 mg | DELAYED_RELEASE_CAPSULE | Freq: Every day | ORAL | 1 refills | Status: DC

## 2018-06-14 NOTE — Telephone Encounter (Signed)
omeprazole (PRILOSEC) 20 MG capsule Pt requests  40mg  removed from his account; 40mg  dose is from historical provider.     Pt states that he is taking these as needed, not as written on the script. Reports he is taking one a day and one more if needed.    Pt needs a 90 day supply called in. (Agent: If no, request that the patient contact the pharmacy for the refill.) (Agent: If yes, when and what did the pharmacy advise?)  [place], [place]    [phone] (Phone) [phone] (Fax)

## 2018-06-20 DIAGNOSIS — L570 Actinic keratosis: Secondary | ICD-10-CM | POA: Diagnosis not present

## 2018-06-20 DIAGNOSIS — L814 Other melanin hyperpigmentation: Secondary | ICD-10-CM | POA: Diagnosis not present

## 2018-06-20 DIAGNOSIS — L0109 Other impetigo: Secondary | ICD-10-CM | POA: Diagnosis not present

## 2018-06-20 DIAGNOSIS — D229 Melanocytic nevi, unspecified: Secondary | ICD-10-CM | POA: Diagnosis not present

## 2018-06-20 DIAGNOSIS — L821 Other seborrheic keratosis: Secondary | ICD-10-CM | POA: Diagnosis not present

## 2018-07-29 ENCOUNTER — Other Ambulatory Visit: Payer: Self-pay | Admitting: Internal Medicine

## 2018-07-29 NOTE — Telephone Encounter (Signed)
Copied from [place] [id number]. Topic: Quick Communication - Rx Refill/Question >> Jul 29, 2018 11:16 AM Selinda Flavin B, NT wrote: Medication: traMADol (ULTRAM) 50 MG tablet    Has the patient contacted their pharmacy? Yes.   (Agent: If no, request that the patient contact the pharmacy for the refill.) (Agent: If yes, when and what did the pharmacy advise?)  Preferred Pharmacy (with phone number or street name): [facility] [address] [place], [place]: Please be advised that RX refills may take up to 3 business days. We ask that you follow-up with your pharmacy.

## 2018-07-29 NOTE — Telephone Encounter (Signed)
Requested medication (s) are due for refill today: yes  Requested medication (s) are on the active medication list: yes  Last refill:  05/25/18 #90 with 1 refill  Future visit scheduled: yes  Notes to clinic:  Unable to refill per protocol    Requested Prescriptions  Pending Prescriptions Disp Refills   traMADol (ULTRAM) 50 MG tablet 90 tablet 1    Sig: Take 1 tablet (50 mg total) by mouth every 8 (eight) hours as needed.     Not Delegated - Analgesics:  Opioid Agonists Failed - 07/29/2018 12:24 PM      Failed - This refill cannot be delegated      Failed - Urine Drug Screen completed in last 360 days.      Passed - Valid encounter within last 6 months    Recent Outpatient Visits          1 month ago Left leg swelling   [place] John, [name], MD   5 months ago Chronic low back pain with right-sided sciatica, unspecified back pain laterality   [place] John, [name], MD   5 months ago Exacerbation of asthma, unspecified asthma severity, unspecified whether persistent   [place] John, [name], MD   8 months ago Preventative health care   [facility] Inpatient [facility] -Georges Mouse, MD   1 year ago Flu vaccine need   [place], [name], MD      Future Appointments            In 3 months [name], [name], MD [place], [facility]

## 2018-08-01 MED ORDER — TRAMADOL HCL 50 MG PO TABS: 50.0000 mg | ORAL_TABLET | Freq: Three times a day (TID) | ORAL | 2 refills | Status: DC | PRN

## 2018-08-01 NOTE — Telephone Encounter (Signed)
Done erx 

## 2018-08-01 NOTE — Telephone Encounter (Signed)
   LOV:06/01/18 NextOV:11/02/18  Last Filled/Quantity:06/26/18 90#

## 2018-08-02 ENCOUNTER — Telehealth: Payer: Self-pay | Admitting: Internal Medicine

## 2018-08-02 NOTE — Telephone Encounter (Signed)
Copied from [place] [id number]. Topic: Quick Communication - See Telephone Encounter >> Aug 02, 2018  1:32 PM Vernona Rieger wrote: CRM for notification. See Telephone encounter for: 08/02/18.  Patient states he would like the nurse to call him back because he wants his " medication list " cleaned up. He said that there are medications on there that he no longer takes. Please call patient at [phone]

## 2018-08-11 ENCOUNTER — Other Ambulatory Visit: Payer: Self-pay | Admitting: Internal Medicine

## 2018-08-11 NOTE — Telephone Encounter (Signed)
Copied from [place] [id number]. Topic: Quick Communication - See Telephone Encounter >> Aug 11, 2018  1:52 PM Ivar Drape wrote: CRM for notification. See Telephone encounter for: 08/11/18. Patient would like a refill on the following medications and have them sent to his preferred pharmacy [facility] on [address]. The patient is out of both prescriptions.  He is anxious to get them filled because the last time he went without, he ended up in the hospital.  1) Alprazolam (XANAX) 0.5 MG tablet 2) Fluticasone-Propionate  50 MCG Spray

## 2018-08-12 MED ORDER — ALPRAZOLAM 0.5 MG PO TABS: ORAL_TABLET | ORAL | 2 refills | Status: DC

## 2018-08-12 NOTE — Telephone Encounter (Signed)
Refills of Advair available at pharmacy.

## 2018-08-12 NOTE — Telephone Encounter (Signed)
Done erx 

## 2018-08-13 ENCOUNTER — Other Ambulatory Visit: Payer: Self-pay | Admitting: Internal Medicine

## 2018-09-12 ENCOUNTER — Other Ambulatory Visit: Payer: Self-pay | Admitting: Internal Medicine

## 2018-09-13 MED ORDER — FLUTICASONE PROPIONATE 50 MCG/ACT NA SUSP: NASAL | 0 refills | Status: DC

## 2018-10-06 NOTE — Progress Notes (Signed)
HEMATOLOGY/ONCOLOGY CLINIC NOTE  Date of Service: 10/07/2018  Patient Care Team: Biagio Borg, MD as PCP - General (Internal Medicine)  CHIEF COMPLAINTS/PURPOSE OF CONSULTATION:  Elevated ferritin levels Polycythemia  HISTORY OF PRESENTING ILLNESS:   [name] [name] is a wonderful 68 y.o. male who has been referred to [place] by Dr .Biagio Borg, MD  for evaluation and management of polycythemia and Elevated ferritin levels.  Patient has a history of hypertension, dyslipidemia, borderline diabetes, asthma, GERD, obesity who on labs on 01/06/2017 with his primary care physician was noted to have an elevated hemoglobin of 18.1 and a hematocrit of 53.3 with a normal platelet count of 294k and a WBC count of 10k.  He was also seen by Dr. Fuller Plan with GI and had a ferritin level drawn as a part of workup for generalized abdominal pain. Ferritin level was noted to be elevated in the 600 range.  He is here for further evaluation and management of his polycythemia and elevated ferritin. Patient notes no family history of hemachromatosis.  No prior history of venous thromboembolism. No fevers no chills no night sweats no weight loss no new bone pains. No headaches no focal neurological deficits no dizziness no vision changes. He has previously had an ultrasound of the abdomen on 01/08/2017 that showed a normal size spleen, cholelithiasis without acute cholecystitis and abdominal appearance of the liver consistent with hepatic steatosis.  Patient reports no family history of iron overload issues. Notes some baseline mild fatigue. No unexpected weight loss. Patient notes that he has not been drinking as much water and believes that he was likely dehydrated on the day that his labs showed polycythemia.  INTERVAL HISTORY  Patient is here for follow-up of his polycythemia and elevated ferritin. The patient's last visit with [place] was on 10/08/17. The pt reports that he is doing well overall.  The pt  reports that he gave blood 4-6 times in the last year, and his last time was in early January 2020. The pt notes that he has enjoyed stable energy levels. He has avoided cooking with cast iron skillets and has reduced his red meat intake as well.   The pt notes that he has had some mild back pains which he relates to "getting old." He takes Senna S with some regularity to keep regular bowel movements.  Lab results today (10/07/18) of CBC w/diff, Reticulocytes and CMP is as follows: all values are WNL except for Glucose at 189. 10/07/18 Ferritin is down to 206 On review of systems, pt reports stable energy levels, some mild back pain, and denies abdominal pains, leg swelling, and any other symptoms.   MEDICAL HISTORY:  Past Medical History:  Diagnosis Date  . Allergic rhinitis   . Allergy    SEASONAL,USE TO TAKE ALLERGY SHOTS  . Anxiety   . Arthritis    BACK,KNEES  . Asthma, mild   . Borderline diabetes mellitus   . Depression   . Diverticulosis of colon   . Gallstones   . Generalized anxiety disorder   . GERD (gastroesophageal reflux disease)   . Hemorrhoids   . History of colonic polyps   . Hyperlipidemia   . Hypertension   . Lumbar back pain   . Obesity   . Venous insufficiency     SURGICAL HISTORY: Past Surgical History:  Procedure Laterality Date  . COLONOSCOPY    . LUMBAR FUSION    . LUMBAR LAMINECTOMY  [id number]  . POLYPECTOMY  SOCIAL HISTORY: Social History   Socioeconomic History  . Marital status: Single    Spouse name: Not on file  . Number of children: Not on file  . Years of education: Not on file  . Highest education level: Not on file  Occupational History  . Occupation: Proofreader work    Fish farm manager: DISABLED    Comment: polo and replacements  . Occupation: disability    Comment: since 10/2003 due to his back  Social Needs  . Financial resource strain: Not on file  . Food insecurity:    Worry: Not on file    Inability: Not on file  .  Transportation needs:    Medical: Not on file    Non-medical: Not on file  Tobacco Use  . Smoking status: Never Smoker  . Smokeless tobacco: Never Used  Substance and Sexual Activity  . Alcohol use: No  . Drug use: No  . Sexual activity: Not on file  Lifestyle  . Physical activity:    Days per week: Not on file    Minutes per session: Not on file  . Stress: Not on file  Relationships  . Social connections:    Talks on phone: Not on file    Gets together: Not on file    Attends religious service: Not on file    Active member of club or organization: Not on file    Attends meetings of clubs or organizations: Not on file    Relationship status: Not on file  . Intimate partner violence:    Fear of current or ex partner: Not on file    Emotionally abused: Not on file    Physically abused: Not on file    Forced sexual activity: Not on file  Other Topics Concern  . Not on file  Social History Narrative  . Not on file    FAMILY HISTORY: Family History  Problem Relation Age of Onset  . Stroke Father   . Heart attack Father   . Colon cancer Father   . Diabetes Father   . Prostate cancer Father   . Lung cancer Mother   . Colon cancer Maternal Grandfather     ALLERGIES:  has No Known Allergies.  MEDICATIONS:  Current Outpatient Medications  Medication Sig Dispense Refill  . albuterol (PROVENTIL HFA;VENTOLIN HFA) 108 (90 Base) MCG/ACT inhaler Inhale 2 puffs into the lungs every 6 (six) hours as needed for wheezing or shortness of breath. 1 Inhaler 5  . ALPRAZolam (XANAX) 0.5 MG tablet TAKE 1 TABLET THREE TIMES DAILY AS NEEDED FOR ANXIETY  OR  SLEEP 90 tablet 2  . amLODipine (NORVASC) 5 MG tablet Take 1 tablet (5 mg total) by mouth daily. 90 tablet 3  . aspirin 81 MG tablet Take 81 mg by mouth daily.      . fluticasone (FLONASE) 50 MCG/ACT nasal spray USE 2 SPRAY(S) IN EACH NOSTRIL ONCE DAILY 16 g 0  . Fluticasone-Salmeterol (ADVAIR DISKUS) 250-50 MCG/DOSE AEPB Inhale 1 puff  into the lungs 2 (two) times daily. 60 each 11  . losartan (COZAAR) 50 MG tablet Take 1 tablet (50 mg total) by mouth daily. 90 tablet 1  . omeprazole (PRILOSEC) 20 MG capsule Take 2 capsules (40 mg total) by mouth daily. 180 capsule 1  . omeprazole (PRILOSEC) 40 MG capsule Take 40 mg by mouth daily.    Manus Gunning BOWEL PREP KIT 17.5-3.13-1.6 GM/180ML SOLN Take 1 each by mouth once.     . traMADol (ULTRAM) 50  MG tablet Take 1 tablet (50 mg total) by mouth every 8 (eight) hours as needed. 90 tablet 2   Current Facility-Administered Medications  Medication Dose Route Frequency Provider Last Rate Last Dose  . methylPREDNISolone acetate (DEPO-MEDROL) injection 80 mg  80 mg Intramuscular Once Biagio Borg, MD        REVIEW OF SYSTEMS:    A 10+ POINT REVIEW OF SYSTEMS WAS OBTAINED including neurology, dermatology, psychiatry, cardiac, respiratory, lymph, extremities, GI, GU, Musculoskeletal, constitutional, breasts, reproductive, HEENT.  All pertinent positives are noted in the HPI.  All others are negative.   PHYSICAL EXAMINATION: ECOG PERFORMANCE STATUS: 1 - Symptomatic but completely ambulatory  . Vitals:   10/07/18 1121  BP: (!) 151/83  Pulse: 70  Resp: 18  Temp: 98.1 F (36.7 C)  SpO2: 97%   Filed Weights   10/07/18 1121  Weight: 254 lb 11.2 oz (115.5 kg)   .Body mass index is 32.7 kg/m.  GENERAL:alert, in no acute distress and comfortable SKIN: no acute rashes, no significant lesions EYES: conjunctiva are pink and non-injected, sclera anicteric OROPHARYNX: MMM, no exudates, no oropharyngeal erythema or ulceration NECK: supple, no JVD LYMPH:  no palpable lymphadenopathy in the cervical, axillary or inguinal regions LUNGS: clear to auscultation b/l with normal respiratory effort HEART: regular rate & rhythm ABDOMEN:  normoactive bowel sounds , non tender, not distended. No palpable hepatosplenomegaly.  Extremity: no pedal edema PSYCH: alert & oriented x 3 with fluent  speech NEURO: no focal motor/sensory deficits   LABORATORY DATA:  I have reviewed the data as listed  . CBC Latest Ref Rng & Units 10/07/2018 01/31/2018 11/29/2017  WBC 4.0 - 10.5 K/uL 7.5 8.1 11.5(H)  Hemoglobin 13.0 - 17.0 g/dL 16.4 16.5 16.2  Hematocrit 39.0 - 52.0 % 48.9 47.2 47.4  Platelets 150 - 400 K/uL 266 270.0 297.0    . CMP Latest Ref Rng & Units 10/07/2018 01/31/2018 11/29/2017  Glucose 70 - 99 mg/dL 189(H) 107(H) 115(H)  BUN 8 - 23 mg/dL _0 Creatinine 0.61 - 1.24 mg/dL 1.08 1.00 0.89  Sodium 135 - 145 mmol/L 142 141 138  Potassium 3.5 - 5.1 mmol/L 4.4 3.3(L) 3.8  Chloride 98 - 111 mmol/L 106 102 100  CO2 22 - 32 mmol/L _1 Calcium 8.9 - 10.3 mg/dL 9.1 9.3 8.9  Total Protein 6.5 - 8.1 g/dL 6.7 6.8 6.7  Total Bilirubin 0.3 - 1.2 mg/dL 0.6 0.7 0.6  Alkaline Phos 38 - 126 U/L 85 71 73  AST 15 - 41 U/L _2 ALT 0 - 44 U/L _3 Hemochromatosis DNA-PCR(c282y,h63d)  Order: [id number]  Status:  Final result Visible to patient:  No (Not Released) Next appt:  05/13/2017 at 11:00 AM in [facility] T. Fuller Plan, MD) Dx:  Elevated ferritin  Component 7d ago  Hemochromatosis Gene Comment   Comment: NO MUTATION IDENTIFIED         RADIOGRAPHIC STUDIES: I have personally reviewed the radiological images as listed and agreed with the findings in the report. No results found.  ASSESSMENT & PLAN:   68 y.o.  Caucasian male with  #1 Polycythemia.-resolved Patient with hemoglobin of 16.6 with hematocrit of 48.6. Repeat labs showed resolution of polycythemia suggesting that this could've been related to hemoconcentration from dehydration. Jak2 V617F and Jak2 Exon 12 mutation studies were negative and rule out polycythemia vera with 95% certainty .  Likely secondary. ?  Dehydration. Rule out sleep apnea. Plan -Patient was recommended to maintain good oral fluid hydration. -if Polycythemia recurs would recommend consideration of sleep  study. -Patient was recommended diet and exercise to optimize his body weight.  #2 Elevated ferritin levels. Ferritin level down to 435. HFE gene mutations neg and suggest against hereditary hemochromatosis. Patient does not report any family history of hemochromatosis, early liver cirrhosis or sudden cardiac death. Elevated ferritin could also be from some element of hepatocellular injury due to fatty liver  PLAN: -Discussed pt labwork today, 10/07/18; HGB stable at 16.4, HCT stable at 48.9, all blood counts are normal. Liver functions stable.  -10/07/18 Ferritin has normalized and improved to 205 from 675 last year -Continue with blood donations every 2-3 months, staying well hydrated, minimizing red meat consumption, and minimizing alcohol use. Pt denies alcohol use. -Pt is satisfied with his regular blood donations, and will continue these in the future -Lifestyle modifications to address fatty liver with primary care physician -Recommend PCP check ferritin once a year -Will see the pt back as needed   RTC with Dr Irene Limbo as needed   All of the patients questions were answered with apparent satisfaction. The patient knows to call the clinic with any problems, questions or concerns.  The total time spent in the appt was 20 minutes and more than 50% was on counseling and direct patient cares.    Sullivan Lone MD [place] AAHIVMS [facility] Hematology/Oncology Physician [facility]  (Office):       [phone] (Work cell):  [phone] (Fax):           [phone]  I, Baldwin [place], am acting as a scribe for Dr. Sullivan Lone.   .I have reviewed the above documentation for accuracy and completeness, and I agree with the above. Brunetta Genera MD

## 2018-10-07 ENCOUNTER — Inpatient Hospital Stay: Payer: Medicare HMO | Attending: Hematology

## 2018-10-07 ENCOUNTER — Inpatient Hospital Stay: Payer: Medicare HMO | Admitting: Hematology

## 2018-10-07 ENCOUNTER — Telehealth: Payer: Self-pay | Admitting: Hematology

## 2018-10-07 VITALS — BP 151/83 | HR 70 | Temp 98.1°F | Resp 18 | Ht 74.0 in | Wt 254.7 lb

## 2018-10-07 DIAGNOSIS — R7989 Other specified abnormal findings of blood chemistry: Secondary | ICD-10-CM | POA: Insufficient documentation

## 2018-10-07 DIAGNOSIS — D751 Secondary polycythemia: Secondary | ICD-10-CM | POA: Diagnosis not present

## 2018-10-07 LAB — CMP (CANCER CENTER ONLY)
ALBUMIN: 3.8 g/dL (ref 3.5–5.0)
ALK PHOS: 85 U/L (ref 38–126)
ALT: 23 U/L (ref 0–44)
AST: 17 U/L (ref 15–41)
Anion gap: 8 (ref 5–15)
BUN: 12 mg/dL (ref 8–23)
CALCIUM: 9.1 mg/dL (ref 8.9–10.3)
CHLORIDE: 106 mmol/L (ref 98–111)
CO2: 28 mmol/L (ref 22–32)
Creatinine: 1.08 mg/dL (ref 0.61–1.24)
GFR, Estimated: >60 mL/min (ref >60)
GLUCOSE: 189 mg/dL — AB (ref 70–99)
POTASSIUM: 4.4 mmol/L (ref 3.5–5.1)
Sodium: 142 mmol/L (ref 135–145)
Total Bilirubin: 0.6 mg/dL (ref 0.3–1.2)
Total Protein: 6.7 g/dL (ref 6.5–8.1)

## 2018-10-07 LAB — CBC WITH DIFFERENTIAL/PLATELET
ABS IMMATURE GRANULOCYTES: 0.04 10*3/uL (ref 0.00–0.07)
BASOS ABS: 0.1 10*3/uL (ref 0.0–0.1)
Basophils Relative: 1 %
Eosinophils Absolute: 0.4 10*3/uL (ref 0.0–0.5)
Eosinophils Relative: 5 %
HEMATOCRIT: 48.9 % (ref 39.0–52.0)
Hemoglobin: 16.4 g/dL (ref 13.0–17.0)
IMMATURE GRANULOCYTES: 1 %
LYMPHS ABS: 2 10*3/uL (ref 0.7–4.0)
Lymphocytes Relative: 26 %
MCH: 30 pg (ref 26.0–34.0)
MCHC: 33.5 g/dL (ref 30.0–36.0)
MCV: 89.6 fL (ref 80.0–100.0)
MONOS PCT: 8 %
Monocytes Absolute: 0.6 10*3/uL (ref 0.1–1.0)
NEUTROS ABS: 4.5 10*3/uL (ref 1.7–7.7)
NEUTROS PCT: 59 %
NRBC: 0 % (ref 0.0–0.2)
PLATELETS: 266 10*3/uL (ref 150–400)
RBC: 5.46 MIL/uL (ref 4.22–5.81)
RDW: 12.6 % (ref 11.5–15.5)
WBC: 7.5 10*3/uL (ref 4.0–10.5)

## 2018-10-07 LAB — RETICULOCYTES
IMMATURE RETIC FRACT: 6.1 % (ref 2.3–15.9)
RBC.: 5.46 MIL/uL (ref 4.22–5.81)
RETIC COUNT ABSOLUTE: 87.9 10*3/uL (ref 19.0–186.0)
Retic Ct Pct: 1.6 % (ref 0.4–3.1)

## 2018-10-07 LAB — FERRITIN: FERRITIN: 205 ng/mL (ref 24–336)

## 2018-10-07 NOTE — Telephone Encounter (Signed)
Per 02/07 los Return as needed. No follow up needed at this time.

## 2018-10-12 ENCOUNTER — Other Ambulatory Visit: Payer: Self-pay | Admitting: Internal Medicine

## 2018-10-12 DIAGNOSIS — L739 Follicular disorder, unspecified: Secondary | ICD-10-CM | POA: Diagnosis not present

## 2018-10-12 DIAGNOSIS — D229 Melanocytic nevi, unspecified: Secondary | ICD-10-CM | POA: Diagnosis not present

## 2018-10-12 DIAGNOSIS — L821 Other seborrheic keratosis: Secondary | ICD-10-CM | POA: Diagnosis not present

## 2018-10-14 ENCOUNTER — Telehealth: Payer: Self-pay | Admitting: Internal Medicine

## 2018-10-14 NOTE — Telephone Encounter (Signed)
Patient advised of dr Judi Cong note/instructions---advised to come 3-5 business days before office visit if he wants to discuss labs in office visit--no guarantee of insurance coverage if not done on same day as office visit--patient repeated back for understanding

## 2018-10-14 NOTE — Telephone Encounter (Signed)
Copied from [place] [id number]. Topic: Quick Communication - See Telephone Encounter >> Oct 14, 2018  9:38 AM Blase Mess A wrote: CRM for notification. See Telephone encounter for: 10/14/18.  Patient is wanting to know does he need blood work prior to the 11/02/2018 appt. Patient states that he saw the oncologist on 10/07/18.Please advise thank you [phone].

## 2018-10-14 NOTE — Telephone Encounter (Signed)
Yes, would need lab work as the oncology do not check the tests that we check

## 2018-10-14 NOTE — Telephone Encounter (Signed)
Routing to dr john, please advise, I will call patient back, thanks 

## 2018-10-18 ENCOUNTER — Other Ambulatory Visit: Payer: Self-pay | Admitting: Internal Medicine

## 2018-10-18 MED ORDER — LOSARTAN POTASSIUM 50 MG PO TABS: 50.0000 mg | ORAL_TABLET | Freq: Every day | ORAL | 0 refills | Status: DC

## 2018-10-18 NOTE — Telephone Encounter (Signed)
Copied from [place] [id number]. Topic: Quick Communication - Rx Refill/Question >> Oct 18, 2018  2:14 PM Judyann Munson wrote: Medication: losartan (COZAAR) 50 MG tablet   Has the patient contacted their pharmacy?no   Preferred Pharmacy (with phone number or street name): [place], [place] [phone] (Phone) [phone] (Fax)    Agent: Please be advised that RX refills may take up to 3 business days. We ask that you follow-up with your pharmacy.

## 2018-10-18 NOTE — Telephone Encounter (Signed)
Requested Prescriptions  Pending Prescriptions Disp Refills  . losartan (COZAAR) 50 MG tablet 90 tablet 0    Sig: Take 1 tablet (50 mg total) by mouth daily.     Cardiovascular:  Angiotensin Receptor Blockers Failed - 10/18/2018  2:24 PM      Failed - Last BP in normal range    BP Readings from Last 1 Encounters:  10/07/18 (!) 151/83         Passed - Cr in normal range and within 180 days    Creatinine  Date Value Ref Range Status  10/07/2018 1.08 0.61 - 1.24 mg/dL Final  04/01/2017 1.0 0.7 - 1.3 mg/dL Final         Passed - K in normal range and within 180 days    Potassium  Date Value Ref Range Status  10/07/2018 4.4 3.5 - 5.1 mmol/L Final  04/01/2017 4.0 3.5 - 5.1 mEq/L Final         Passed - Patient is not pregnant      Passed - Valid encounter within last 6 months    Recent Outpatient Visits          4 months ago Left leg swelling   [name], [name], MD   7 months ago Chronic low back pain with right-sided sciatica, unspecified back pain laterality   [name], [name], MD   8 months ago Exacerbation of asthma, unspecified asthma severity, unspecified whether persistent   [name], [name], MD   10 months ago Preventative health care   [name], [facility]   1 year ago Flu vaccine need   [name], [name], MD      Future Appointments            In 2 weeks [name], [name], MD [place], [facility]

## 2018-10-24 ENCOUNTER — Other Ambulatory Visit (INDEPENDENT_AMBULATORY_CARE_PROVIDER_SITE_OTHER): Payer: Medicare HMO

## 2018-10-24 DIAGNOSIS — E785 Hyperlipidemia, unspecified: Secondary | ICD-10-CM | POA: Diagnosis not present

## 2018-10-24 DIAGNOSIS — R7303 Prediabetes: Secondary | ICD-10-CM | POA: Diagnosis not present

## 2018-10-24 DIAGNOSIS — Z0000 Encounter for general adult medical examination without abnormal findings: Secondary | ICD-10-CM | POA: Diagnosis not present

## 2018-10-24 LAB — URINALYSIS, ROUTINE W REFLEX MICROSCOPIC
BILIRUBIN URINE: NEGATIVE
HGB URINE DIPSTICK: NEGATIVE
KETONES UR: NEGATIVE
LEUKOCYTE UA: NEGATIVE
NITRITE: NEGATIVE
Specific Gravity, Urine: 1.025 (ref 1.000–1.030)
Total Protein, Urine: NEGATIVE
UROBILINOGEN UA: 0.2 (ref 0.0–1.0)
Urine Glucose: NEGATIVE
pH: 5.5 (ref 5.0–8.0)

## 2018-10-24 LAB — BASIC METABOLIC PANEL
BUN: 13 mg/dL (ref 6–23)
CHLORIDE: 104 meq/L (ref 96–112)
CO2: 27 mEq/L (ref 19–32)
CREATININE: 1.14 mg/dL (ref 0.40–1.50)
Calcium: 8.9 mg/dL (ref 8.4–10.5)
GFR: 64.07 mL/min (ref >60.00)
Glucose, Bld: 101 mg/dL — ABNORMAL HIGH (ref 70–99)
POTASSIUM: 4 meq/L (ref 3.5–5.1)
Sodium: 141 mEq/L (ref 135–145)

## 2018-10-24 LAB — HEPATIC FUNCTION PANEL
ALBUMIN: 4.1 g/dL (ref 3.5–5.2)
ALT: 20 U/L (ref 0–53)
AST: 17 U/L (ref 0–37)
Alkaline Phosphatase: 75 U/L (ref 39–117)
Bilirubin, Direct: 0.1 mg/dL (ref 0.0–0.3)
TOTAL PROTEIN: 6.3 g/dL (ref 6.0–8.3)
Total Bilirubin: 0.7 mg/dL (ref 0.2–1.2)

## 2018-10-24 LAB — LIPID PANEL
CHOLESTEROL: 119 mg/dL (ref 0–200)
HDL: 29 mg/dL — ABNORMAL LOW (ref >39.00)
LDL CALC: 51 mg/dL (ref 0–99)
NonHDL: 90.07
TRIGLYCERIDES: 194 mg/dL — AB (ref 0.0–149.0)
Total CHOL/HDL Ratio: 4
VLDL: 38.8 mg/dL (ref 0.0–40.0)

## 2018-10-24 LAB — CBC WITH DIFFERENTIAL/PLATELET
BASOS PCT: 0.9 % (ref 0.0–3.0)
Basophils Absolute: 0.1 10*3/uL (ref 0.0–0.1)
EOS PCT: 3.1 % (ref 0.0–5.0)
Eosinophils Absolute: 0.2 10*3/uL (ref 0.0–0.7)
HEMATOCRIT: 47.7 % (ref 39.0–52.0)
Hemoglobin: 16.3 g/dL (ref 13.0–17.0)
LYMPHS PCT: 21.9 % (ref 12.0–46.0)
Lymphs Abs: 1.6 10*3/uL (ref 0.7–4.0)
MCHC: 34.3 g/dL (ref 30.0–36.0)
MCV: 88.2 fl (ref 78.0–100.0)
MONOS PCT: 9.4 % (ref 3.0–12.0)
Monocytes Absolute: 0.7 10*3/uL (ref 0.1–1.0)
NEUTROS ABS: 4.8 10*3/uL (ref 1.4–7.7)
Neutrophils Relative %: 64.7 % (ref 43.0–77.0)
PLATELETS: 273 10*3/uL (ref 150.0–400.0)
RBC: 5.4 Mil/uL (ref 4.22–5.81)
RDW: 12.9 % (ref 11.5–15.5)
WBC: 7.3 10*3/uL (ref 4.0–10.5)

## 2018-10-24 LAB — PSA: PSA: 1 ng/mL (ref 0.10–4.00)

## 2018-10-24 LAB — TSH: TSH: 0.57 u[IU]/mL (ref 0.35–4.50)

## 2018-10-24 LAB — HEMOGLOBIN A1C: Hgb A1c MFr Bld: 6.2 % (ref 4.6–6.5)

## 2018-10-30 ENCOUNTER — Other Ambulatory Visit: Payer: Self-pay | Admitting: Internal Medicine

## 2018-10-31 NOTE — Telephone Encounter (Signed)
   LOV:06/01/18 NextOV:11/02/18  Last Filled/Quantity:10/20/18 90#

## 2018-10-31 NOTE — Telephone Encounter (Signed)
Done erx 

## 2018-11-02 ENCOUNTER — Ambulatory Visit (INDEPENDENT_AMBULATORY_CARE_PROVIDER_SITE_OTHER): Payer: Medicare HMO | Admitting: Internal Medicine

## 2018-11-02 ENCOUNTER — Encounter: Payer: Self-pay | Admitting: Internal Medicine

## 2018-11-02 VITALS — BP 124/72 | HR 74 | Temp 97.9°F | Ht 74.0 in | Wt 252.0 lb

## 2018-11-02 DIAGNOSIS — Z0000 Encounter for general adult medical examination without abnormal findings: Secondary | ICD-10-CM

## 2018-11-02 DIAGNOSIS — I10 Essential (primary) hypertension: Secondary | ICD-10-CM | POA: Diagnosis not present

## 2018-11-02 DIAGNOSIS — R7303 Prediabetes: Secondary | ICD-10-CM | POA: Diagnosis not present

## 2018-11-02 NOTE — Assessment & Plan Note (Signed)
stable overall by history and exam, recent data reviewed with pt, and pt to continue medical treatment as before,  to f/u any worsening symptoms or concerns  

## 2018-11-02 NOTE — Patient Instructions (Signed)
Please continue all other medications as before, and refills have been done if requested.  Please have the pharmacy call with any other refills you may need.  Please continue your efforts at being more active, low cholesterol diet, and weight control.  You are otherwise up to date with prevention measures today.  Please keep your appointments with your specialists as you may have planned  Please return in 6 months, or sooner if needed, with Lab testing done 3-5 days before  

## 2018-11-02 NOTE — Assessment & Plan Note (Signed)
For f/u a1c with labs

## 2018-11-02 NOTE — Assessment & Plan Note (Signed)

## 2018-11-02 NOTE — Progress Notes (Signed)
Subjective:    Patient ID: [name] [name], male    DOB: July 11, 1951, 68 y.o.   MRN: [id number]  HPI  Here for wellness and f/u;  Overall doing ok;  Pt denies Chest pain, worsening SOB, DOE, wheezing, orthopnea, PND, worsening LE edema, palpitations, dizziness or syncope.  Pt denies neurological change such as new headache, facial or extremity weakness.  Pt denies polydipsia, polyuria, or low sugar symptoms. Pt states overall good compliance with treatment and medications, good tolerability, and has been trying to follow appropriate diet.  Pt denies worsening depressive symptoms, suicidal ideation or panic. No fever, night sweats, wt loss, loss of appetite, or other constitutional symptoms.  Pt states good ability with ADL's, has low fall risk, home safety reviewed and adequate, no other significant changes in hearing or vision, and only occasionally active with exercise. Pt continues to have recurring right > left LBP without change in severity, bowel or bladder change, fever, wt loss,  worsening LE pain/numbness/weakness, gait change or falls. No new complaints. Extremely verbose and difficult historian Past Medical History:  Diagnosis Date  . Allergic rhinitis   . Allergy    SEASONAL,USE TO TAKE ALLERGY SHOTS  . Anxiety   . Arthritis    BACK,KNEES  . Asthma, mild   . Borderline diabetes mellitus   . Depression   . Diverticulosis of colon   . Gallstones   . Generalized anxiety disorder   . GERD (gastroesophageal reflux disease)   . Hemorrhoids   . History of colonic polyps   . Hyperlipidemia   . Hypertension   . Lumbar back pain   . Obesity   . Venous insufficiency    Past Surgical History:  Procedure Laterality Date  . COLONOSCOPY    . LUMBAR FUSION    . LUMBAR LAMINECTOMY  [id number]  . POLYPECTOMY      reports that he has never smoked. He has never used smokeless tobacco. He reports that he does not drink alcohol or use drugs. family history includes Colon cancer in his  father and maternal grandfather; Diabetes in his father; Heart attack in his father; Lung cancer in his mother; Prostate cancer in his father; Stroke in his father. No Known Allergies Current Outpatient Medications on File Prior to Visit  Medication Sig Dispense Refill  . albuterol (PROVENTIL HFA;VENTOLIN HFA) 108 (90 Base) MCG/ACT inhaler Inhale 2 puffs into the lungs every 6 (six) hours as needed for wheezing or shortness of breath. 1 Inhaler 5  . ALPRAZolam (XANAX) 0.5 MG tablet TAKE 1 TABLET THREE TIMES DAILY AS NEEDED FOR ANXIETY  OR  SLEEP 90 tablet 2  . amLODipine (NORVASC) 5 MG tablet Take 1 tablet (5 mg total) by mouth daily. 90 tablet 3  . aspirin 81 MG tablet Take 81 mg by mouth daily.      . fluticasone (FLONASE) 50 MCG/ACT nasal spray USE 2 SPRAY(S) IN EACH NOSTRIL ONCE DAILY 16 g 0  . Fluticasone-Salmeterol (ADVAIR DISKUS) 250-50 MCG/DOSE AEPB Inhale 1 puff into the lungs 2 (two) times daily. 60 each 11  . losartan (COZAAR) 50 MG tablet Take 1 tablet (50 mg total) by mouth daily. 90 tablet 0  . omeprazole (PRILOSEC) 20 MG capsule Take 2 capsules (40 mg total) by mouth daily. 180 capsule 1  . traMADol (ULTRAM) 50 MG tablet TAKE 1 TABLET BY MOUTH EVERY 8 HOURS AS NEEDED 90 tablet 2   No current facility-administered medications on file prior to visit.    Review of  Systems Constitutional: Negative for other unusual diaphoresis, sweats, appetite or weight changes HENT: Negative for other worsening hearing loss, ear pain, facial swelling, mouth sores or neck stiffness.   Eyes: Negative for other worsening pain, redness or other visual disturbance.  Respiratory: Negative for other stridor or swelling Cardiovascular: Negative for other palpitations or other chest pain  Gastrointestinal: Negative for worsening diarrhea or loose stools, blood in stool, distention or other pain Genitourinary: Negative for hematuria, flank pain or other change in urine volume.  Musculoskeletal: Negative  for myalgias or other joint swelling.  Skin: Negative for other color change, or other wound or worsening drainage.  Neurological: Negative for other syncope or numbness. Hematological: Negative for other adenopathy or swelling Psychiatric/Behavioral: Negative for hallucinations, other worsening agitation, SI, self-injury, or new decreased concentration All other system neg per pt    Objective:   Physical Exam BP 124/72   Pulse 74   Temp 97.9 F (36.6 C) (Oral)   Ht 6\' 2"  (1.88 m)   Wt 252 lb (114.3 kg)   SpO2 95%   BMI 32.35 kg/m  VS noted,  Constitutional: Pt is oriented to person, place, and time. Appears well-developed and well-nourished, in no significant distress and comfortable Head: Normocephalic and atraumatic  Eyes: Conjunctivae and EOM are normal. Pupils are equal, round, and reactive to light Right Ear: External ear normal without discharge Left Ear: External ear normal without discharge Nose: Nose without discharge or deformity Mouth/Throat: Oropharynx is without other ulcerations and moist  Neck: Normal range of motion. Neck supple. No JVD present. No tracheal deviation present or significant neck LA or mass Cardiovascular: Normal rate, regular rhythm, normal heart sounds and intact distal pulses.   Pulmonary/Chest: WOB normal and breath sounds without rales or wheezing  Abdominal: Soft. Bowel sounds are normal. NT. No HSM  Musculoskeletal: Normal range of motion. Exhibits no edema Lymphadenopathy: Has no other cervical adenopathy.  Neurological: Pt is alert and oriented to person, place, and time. Pt has normal reflexes. No cranial nerve deficit. Motor grossly intact, Gait intact Skin: Skin is warm and dry. No rash noted or new ulcerations Psychiatric:  Has normal mood and affect. Behavior is normal without agitation No other exam findings  Lab Results  Component Value Date   WBC 7.3 10/24/2018   HGB 16.3 10/24/2018   HCT 47.7 10/24/2018   PLT 273.0 10/24/2018    GLUCOSE 101 (H) 10/24/2018   CHOL 119 10/24/2018   TRIG 194.0 (H) 10/24/2018   HDL 29.00 (L) 10/24/2018   LDLDIRECT 75.0 01/06/2017   LDLCALC 51 10/24/2018   ALT 20 10/24/2018   AST 17 10/24/2018   NA 141 10/24/2018   K 4.0 10/24/2018   CL 104 10/24/2018   CREATININE 1.14 10/24/2018   BUN 13 10/24/2018   CO2 27 10/24/2018   TSH 0.57 10/24/2018   PSA 1.00 10/24/2018   INR 1.20 12/06/2015   HGBA1C 6.2 10/24/2018      Assessment & Plan:

## 2018-11-05 ENCOUNTER — Other Ambulatory Visit: Payer: Self-pay | Admitting: Internal Medicine

## 2018-11-07 NOTE — Telephone Encounter (Signed)
Done erx 

## 2018-11-23 ENCOUNTER — Other Ambulatory Visit: Payer: Self-pay | Admitting: Internal Medicine

## 2018-12-08 ENCOUNTER — Other Ambulatory Visit: Payer: Self-pay | Admitting: Internal Medicine

## 2018-12-12 ENCOUNTER — Other Ambulatory Visit: Payer: Self-pay | Admitting: Internal Medicine

## 2019-01-12 ENCOUNTER — Telehealth: Payer: Self-pay | Admitting: Internal Medicine

## 2019-01-12 DIAGNOSIS — M545 Low back pain, unspecified: Secondary | ICD-10-CM

## 2019-01-12 NOTE — Telephone Encounter (Signed)
Copied from [place]. Topic: Referral - Request for Referral >> Jan 12, 2019  1:39 PM Rutherford Nail, [place] wrote: Has patient seen PCP for this complaint? yes *If NO, is insurance requiring patient see PCP for this issue before PCP can refer them? Referral for which specialty: pain management for back pain and orthopedic surgeon Preferred provider/office:  Reason for referral: States that Dr Maxie Better at [facility] is requesting him to go to [facility] for treatment instead of just doing pain management. States that he cannot drive to [place]. Would like get a 2nd opinion. Please advise.

## 2019-01-12 NOTE — Addendum Note (Signed)
Addended by: Biagio Borg on: 01/12/2019 08:10 PM   Modules accepted: Orders

## 2019-01-12 NOTE — Telephone Encounter (Signed)
Ok, referral is done

## 2019-01-13 NOTE — Telephone Encounter (Signed)
FYI: I sent referral to different Ortho ([facility]) and this was their response:  "Thanks for trusting the care of your patient to our office. We would love to schedule an appointment, however patient declined appointment. Pt was very upset & states he was seeing Dr Maxie Better @ [facility] and he recommended he see a Neuro provider in [place]. Pt states he will call Dr Gwynn Burly office."

## 2019-01-18 ENCOUNTER — Other Ambulatory Visit: Payer: Self-pay | Admitting: Internal Medicine

## 2019-01-18 NOTE — Telephone Encounter (Signed)
Same answer as yesterday  Xanax too soon as was just done for total 3 mo on mar 10

## 2019-01-24 ENCOUNTER — Telehealth: Payer: Self-pay | Admitting: Internal Medicine

## 2019-01-24 ENCOUNTER — Other Ambulatory Visit: Payer: Self-pay | Admitting: Internal Medicine

## 2019-01-24 MED ORDER — LOSARTAN POTASSIUM 50 MG PO TABS: 50.0000 mg | ORAL_TABLET | Freq: Every day | ORAL | 1 refills | Status: DC

## 2019-01-24 NOTE — Telephone Encounter (Signed)
Done erx 

## 2019-01-24 NOTE — Telephone Encounter (Signed)
Copied from [place] [id number]. Topic: Quick Communication - Rx Refill/Question >> Jan 24, 2019 10:36 AM Selinda Flavin B, NT wrote: Medication: losartan (COZAAR) 50 MG tablet  Has the patient contacted their pharmacy? yes (Agent: If no, request that the patient contact the pharmacy for the refill.) (Agent: If yes, when and what did the pharmacy advise?)  Preferred Pharmacy (with phone number or street name): [place], [address]  Agent: Please be advised that RX refills may take up to 3 business days. We ask that you follow-up with your pharmacy.

## 2019-01-24 NOTE — Telephone Encounter (Signed)
Please advise 

## 2019-01-24 NOTE — Telephone Encounter (Signed)
Patient calling and states that he would like ot know why the last refill for this prescription was denied. Patient states that the last prescription for this that he picked up was a month supply ( 90 tablets, takes 1 tablet 3x a day). States that he is needing this prescription and was unsure why the last refill was denied for being requested too soon. Please advise.

## 2019-01-25 ENCOUNTER — Telehealth: Payer: Self-pay | Admitting: Internal Medicine

## 2019-01-25 NOTE — Telephone Encounter (Signed)
Copied from [place] [id number]. Topic: Referral - Request for Referral >> Jan 25, 2019  4:04 PM Leward Quan A wrote: Has patient seen PCP for this complaint? Yes.   *If NO, is insurance requiring patient see PCP for this issue before PCP can refer them? Referral for which specialty: Neuro Surgeon Preferred provider/office: [facility] Reason for referral: Back pain  Ph# [phone]

## 2019-01-26 NOTE — Telephone Encounter (Signed)
Pt has already been referred to orthopedic; the NS always require an MRI for a referral and pt has not had this done; I would continue the ortho referral unless the patient further objects

## 2019-01-27 ENCOUNTER — Other Ambulatory Visit: Payer: Self-pay | Admitting: Internal Medicine

## 2019-01-27 NOTE — Telephone Encounter (Signed)
Done erx 

## 2019-02-13 DIAGNOSIS — L739 Follicular disorder, unspecified: Secondary | ICD-10-CM | POA: Diagnosis not present

## 2019-02-13 DIAGNOSIS — D229 Melanocytic nevi, unspecified: Secondary | ICD-10-CM | POA: Diagnosis not present

## 2019-03-30 ENCOUNTER — Other Ambulatory Visit: Payer: Self-pay | Admitting: Internal Medicine

## 2019-04-12 DIAGNOSIS — L570 Actinic keratosis: Secondary | ICD-10-CM | POA: Diagnosis not present

## 2019-04-27 ENCOUNTER — Other Ambulatory Visit: Payer: Self-pay | Admitting: Internal Medicine

## 2019-04-27 NOTE — Telephone Encounter (Signed)
Done erx 

## 2019-05-01 ENCOUNTER — Encounter: Payer: Self-pay | Admitting: Internal Medicine

## 2019-05-03 ENCOUNTER — Other Ambulatory Visit (INDEPENDENT_AMBULATORY_CARE_PROVIDER_SITE_OTHER): Payer: Medicare HMO

## 2019-05-03 DIAGNOSIS — R7303 Prediabetes: Secondary | ICD-10-CM | POA: Diagnosis not present

## 2019-05-03 DIAGNOSIS — I10 Essential (primary) hypertension: Secondary | ICD-10-CM | POA: Diagnosis not present

## 2019-05-03 LAB — LIPID PANEL
Cholesterol: 101 mg/dL (ref 0–200)
HDL: 29.5 mg/dL — ABNORMAL LOW (ref >39.00)
NonHDL: 71.78
Total CHOL/HDL Ratio: 3
Triglycerides: 204 mg/dL — ABNORMAL HIGH (ref 0.0–149.0)
VLDL: 40.8 mg/dL — ABNORMAL HIGH (ref 0.0–40.0)

## 2019-05-03 LAB — HEMOGLOBIN A1C: Hgb A1c MFr Bld: 6 % (ref 4.6–6.5)

## 2019-05-03 LAB — HEPATIC FUNCTION PANEL
ALT: 15 U/L (ref 0–53)
AST: 14 U/L (ref 0–37)
Albumin: 3.7 g/dL (ref 3.5–5.2)
Alkaline Phosphatase: 66 U/L (ref 39–117)
Bilirubin, Direct: 0 mg/dL (ref 0.0–0.3)
Total Bilirubin: 0.4 mg/dL (ref 0.2–1.2)
Total Protein: 6.2 g/dL (ref 6.0–8.3)

## 2019-05-03 LAB — BASIC METABOLIC PANEL
BUN: 10 mg/dL (ref 6–23)
CO2: 29 mEq/L (ref 19–32)
Calcium: 9 mg/dL (ref 8.4–10.5)
Chloride: 102 mEq/L (ref 96–112)
Creatinine, Ser: 1.14 mg/dL (ref 0.40–1.50)
GFR: 63.96 mL/min (ref >60.00)
Glucose, Bld: 164 mg/dL — ABNORMAL HIGH (ref 70–99)
Potassium: 4.1 mEq/L (ref 3.5–5.1)
Sodium: 138 mEq/L (ref 135–145)

## 2019-05-03 LAB — LDL CHOLESTEROL, DIRECT: Direct LDL: 57 mg/dL

## 2019-05-05 ENCOUNTER — Ambulatory Visit: Payer: Medicare HMO | Admitting: Internal Medicine

## 2019-05-12 ENCOUNTER — Ambulatory Visit (INDEPENDENT_AMBULATORY_CARE_PROVIDER_SITE_OTHER): Payer: Medicare HMO | Admitting: Internal Medicine

## 2019-05-12 ENCOUNTER — Encounter: Payer: Self-pay | Admitting: Internal Medicine

## 2019-05-12 ENCOUNTER — Other Ambulatory Visit: Payer: Self-pay

## 2019-05-12 VITALS — BP 116/78 | HR 75 | Temp 98.5°F | Ht 74.0 in | Wt 238.0 lb

## 2019-05-12 DIAGNOSIS — E559 Vitamin D deficiency, unspecified: Secondary | ICD-10-CM | POA: Diagnosis not present

## 2019-05-12 DIAGNOSIS — I10 Essential (primary) hypertension: Secondary | ICD-10-CM | POA: Diagnosis not present

## 2019-05-12 DIAGNOSIS — F411 Generalized anxiety disorder: Secondary | ICD-10-CM

## 2019-05-12 DIAGNOSIS — R7303 Prediabetes: Secondary | ICD-10-CM | POA: Diagnosis not present

## 2019-05-12 DIAGNOSIS — E611 Iron deficiency: Secondary | ICD-10-CM

## 2019-05-12 DIAGNOSIS — Z0000 Encounter for general adult medical examination without abnormal findings: Secondary | ICD-10-CM

## 2019-05-12 DIAGNOSIS — Z23 Encounter for immunization: Secondary | ICD-10-CM

## 2019-05-12 DIAGNOSIS — E538 Deficiency of other specified B group vitamins: Secondary | ICD-10-CM | POA: Diagnosis not present

## 2019-05-12 DIAGNOSIS — E785 Hyperlipidemia, unspecified: Secondary | ICD-10-CM

## 2019-05-12 NOTE — Patient Instructions (Addendum)
You had the flu shot today  Please continue all other medications as before, and refills have been done if requested.  Please have the pharmacy call with any other refills you may need.  Please continue your efforts at being more active, low cholesterol diet, and weight control.  Please keep your appointments with your specialists as you may have planned  Please return in 6 months, or sooner if needed, with Lab testing done 3-5 days before

## 2019-05-12 NOTE — Progress Notes (Signed)
Subjective:    Patient ID: [name] [name], male    DOB: 1951/08/11, 68 y.o.   MRN: [id number]  HPI  Here to f/u; overall doing ok,  Pt denies chest pain, increasing sob or doe, wheezing, orthopnea, PND, increased LE swelling, palpitations, dizziness or syncope.  Pt denies new neurological symptoms such as new headache, or facial or extremity weakness or numbness.  Pt denies polydipsia, polyuria, or low sugar episode.  Pt states overall good compliance with meds, mostly trying to follow appropriate diet, with wt overall stable, Pt continues to have recurring LBP without change in severity, bowel or bladder change, fever, wt loss,  worsening LE pain/numbness/weakness, gait change or falls. Denies worsening depressive symptoms, suicidal ideation, or panic; has ongoing anxiety, Wt Readings from Last 3 Encounters:  05/12/19 238 lb (108 kg)  11/02/18 252 lb (114.3 kg)  10/07/18 254 lb 11.2 oz (115.5 kg)   BP Readings from Last 3 Encounters:  05/12/19 116/78  11/02/18 124/72  10/07/18 (!) 151/83   Past Medical History:  Diagnosis Date  . Allergic rhinitis   . Allergy    SEASONAL,USE TO TAKE ALLERGY SHOTS  . Anxiety   . Arthritis    BACK,KNEES  . Asthma, mild   . Borderline diabetes mellitus   . Depression   . Diverticulosis of colon   . Gallstones   . Generalized anxiety disorder   . GERD (gastroesophageal reflux disease)   . Hemorrhoids   . History of colonic polyps   . Hyperlipidemia   . Hypertension   . Lumbar back pain   . Obesity   . Venous insufficiency    Past Surgical History:  Procedure Laterality Date  . COLONOSCOPY    . LUMBAR FUSION    . LUMBAR LAMINECTOMY  [id number]  . POLYPECTOMY      reports that he has never smoked. He has never used smokeless tobacco. He reports that he does not drink alcohol or use drugs. family history includes Colon cancer in his father and maternal grandfather; Diabetes in his father; Heart attack in his father; Lung cancer in his mother;  Prostate cancer in his father; Stroke in his father. No Known Allergies Current Outpatient Medications on File Prior to Visit  Medication Sig Dispense Refill  . albuterol (PROVENTIL HFA;VENTOLIN HFA) 108 (90 Base) MCG/ACT inhaler Inhale 2 puffs into the lungs every 6 (six) hours as needed for wheezing or shortness of breath. 1 Inhaler 5  . ALPRAZolam (XANAX) 0.5 MG tablet TAKE 1 TABLET BY MOUTH THREE TIMES DAILY AS NEEDED FOR ANXIETY 90 tablet 2  . amLODipine (NORVASC) 5 MG tablet TAKE 1 TABLET EVERY DAY 90 tablet 3  . aspirin 81 MG tablet Take 81 mg by mouth daily.      . fluticasone (FLONASE) 50 MCG/ACT nasal spray INSTILL 2 SPRAY(S) IN EACH NOSTRIL ONCE DAILY 16 g 0  . Fluticasone-Salmeterol (ADVAIR DISKUS) 250-50 MCG/DOSE AEPB Inhale 1 puff into the lungs 2 (two) times daily. 60 each 11  . losartan (COZAAR) 50 MG tablet Take 1 tablet (50 mg total) by mouth daily. 90 tablet 1  . omeprazole (PRILOSEC) 20 MG capsule TAKE 2 CAPSULES EVERY DAY 180 capsule 1  . traMADol (ULTRAM) 50 MG tablet TAKE 1 TABLET BY MOUTH EVERY 8 HOURS AS NEEDED 90 tablet 2   No current facility-administered medications on file prior to visit.    Review of Systems  Constitutional: Negative for other unusual diaphoresis or sweats HENT: Negative for ear discharge or  swelling Eyes: Negative for other worsening visual disturbances Respiratory: Negative for stridor or other swelling  Gastrointestinal: Negative for worsening distension or other blood Genitourinary: Negative for retention or other urinary change Musculoskeletal: Negative for other MSK pain or swelling Skin: Negative for color change or other new lesions Neurological: Negative for worsening tremors and other numbness  Psychiatric/Behavioral: Negative for worsening agitation or other fatigue All other system neg per pt    Objective:   Physical Exam BP 116/78   Pulse 75   Temp 98.5 F (36.9 C) (Oral)   Ht 6\' 2"  (1.88 m)   Wt 238 lb (108 kg)   SpO2  97%   BMI 30.56 kg/m  VS noted,  Constitutional: Pt appears in NAD HENT: Head: NCAT.  Right Ear: External ear normal.  Left Ear: External ear normal.  Eyes: . Pupils are equal, round, and reactive to light. Conjunctivae and EOM are normal Nose: without d/c or deformity Neck: Neck supple. Gross normal ROM Cardiovascular: Normal rate and regular rhythm.   Pulmonary/Chest: Effort normal and breath sounds without rales or wheezing.  Abd:  Soft, NT, ND, + BS, no organomegaly Neurological: Pt is alert. At baseline orientation, motor grossly intact Skin: Skin is warm. No rashes, other new lesions, no LE edema Psychiatric: Pt behavior is normal without agitation , nervous mood and affect No other exam findings Lab Results  Component Value Date   WBC 7.3 10/24/2018   HGB 16.3 10/24/2018   HCT 47.7 10/24/2018   PLT 273.0 10/24/2018   GLUCOSE 164 (H) 05/03/2019   CHOL 101 05/03/2019   TRIG 204.0 (H) 05/03/2019   HDL 29.50 (L) 05/03/2019   LDLDIRECT 57.0 05/03/2019   LDLCALC 51 10/24/2018   ALT 15 05/03/2019   AST 14 05/03/2019   NA 138 05/03/2019   K 4.1 05/03/2019   CL 102 05/03/2019   CREATININE 1.14 05/03/2019   BUN 10 05/03/2019   CO2 29 05/03/2019   TSH 0.57 10/24/2018   PSA 1.00 10/24/2018   INR 1.20 12/06/2015   HGBA1C 6.0 05/03/2019      Assessment & Plan:

## 2019-05-13 ENCOUNTER — Encounter: Payer: Self-pay | Admitting: Internal Medicine

## 2019-05-13 NOTE — Assessment & Plan Note (Signed)
stable overall by history and exam, recent data reviewed with pt, and pt to continue medical treatment as before,  to f/u any worsening symptoms or concerns  

## 2019-05-22 DIAGNOSIS — B079 Viral wart, unspecified: Secondary | ICD-10-CM | POA: Diagnosis not present

## 2019-05-22 DIAGNOSIS — L739 Follicular disorder, unspecified: Secondary | ICD-10-CM | POA: Diagnosis not present

## 2019-05-22 IMAGING — DX DG CHEST 2V
2 series · 2 of 2 positions shown · non-contrast
Comparison: PA and lateral chest x-ray dated May 13, 2017.

CLINICAL DATA: Shortness of breath and chest tightness for the past
2 weeks. History of asthma, hypertension, nonsmoker.

EXAM:
CHEST - 2 VIEW

[chest pa]
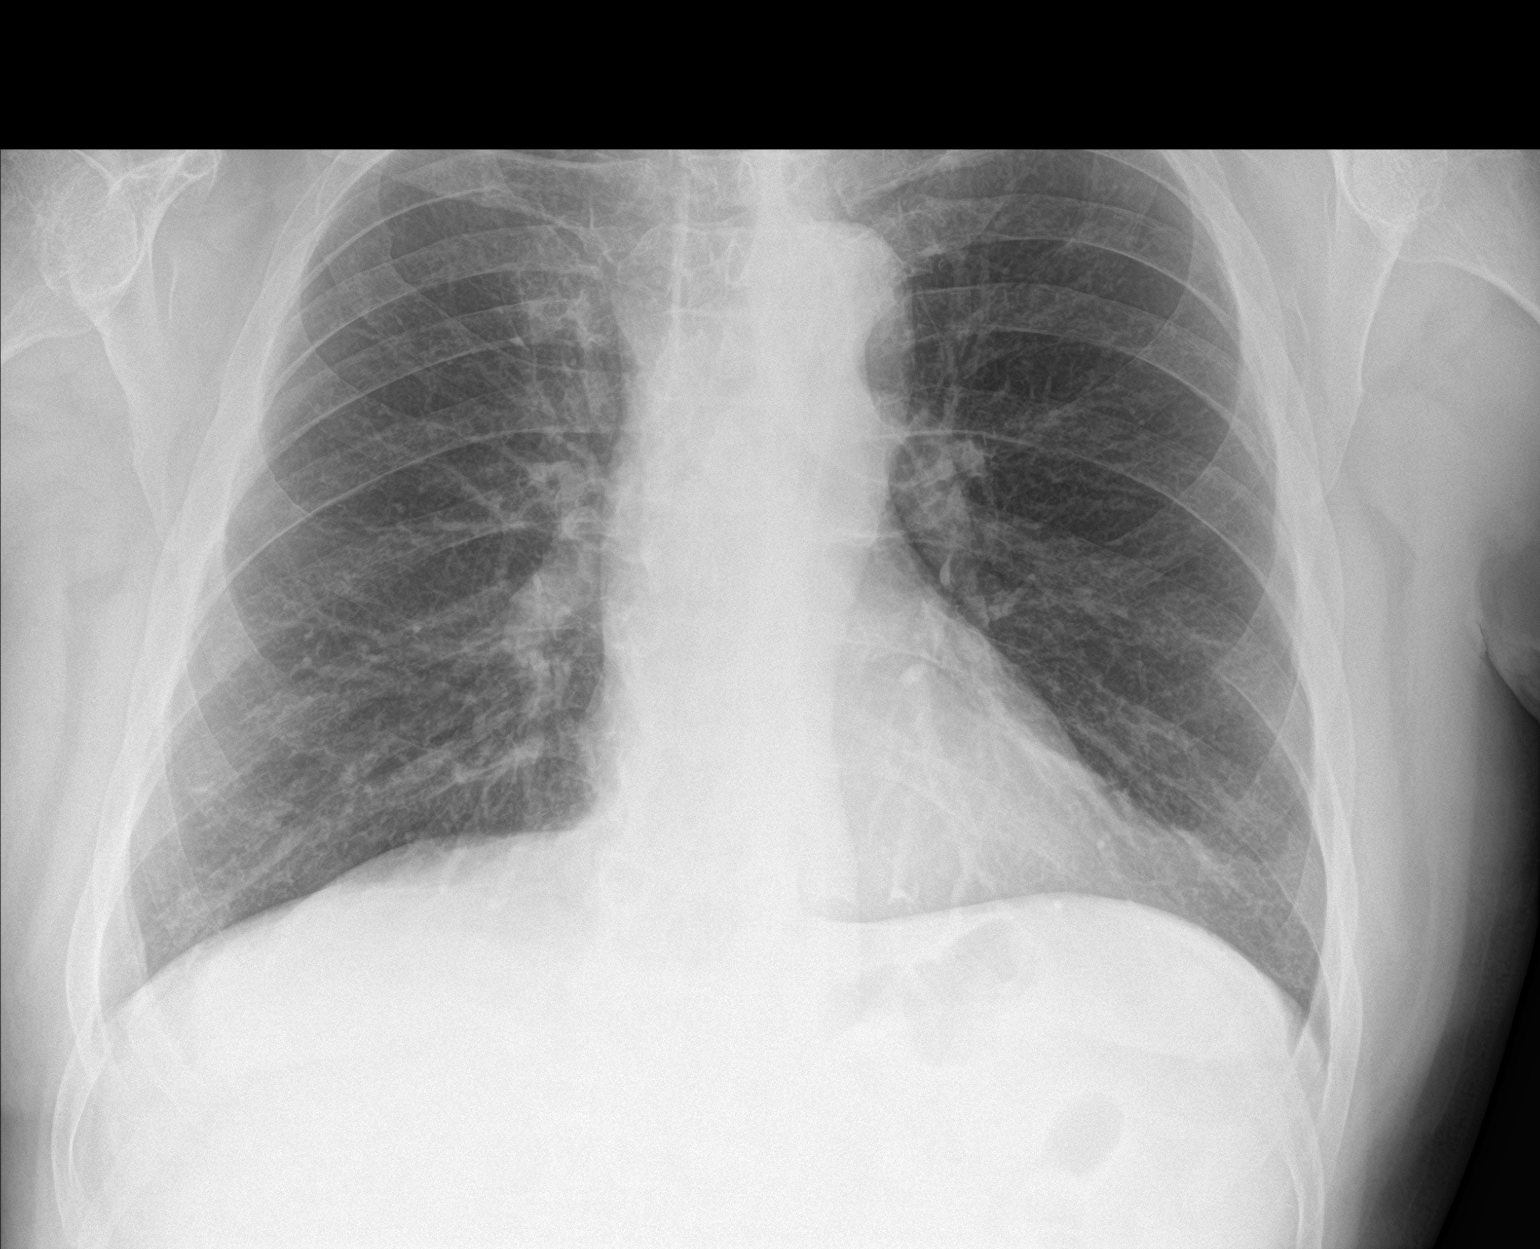

[chest lat]
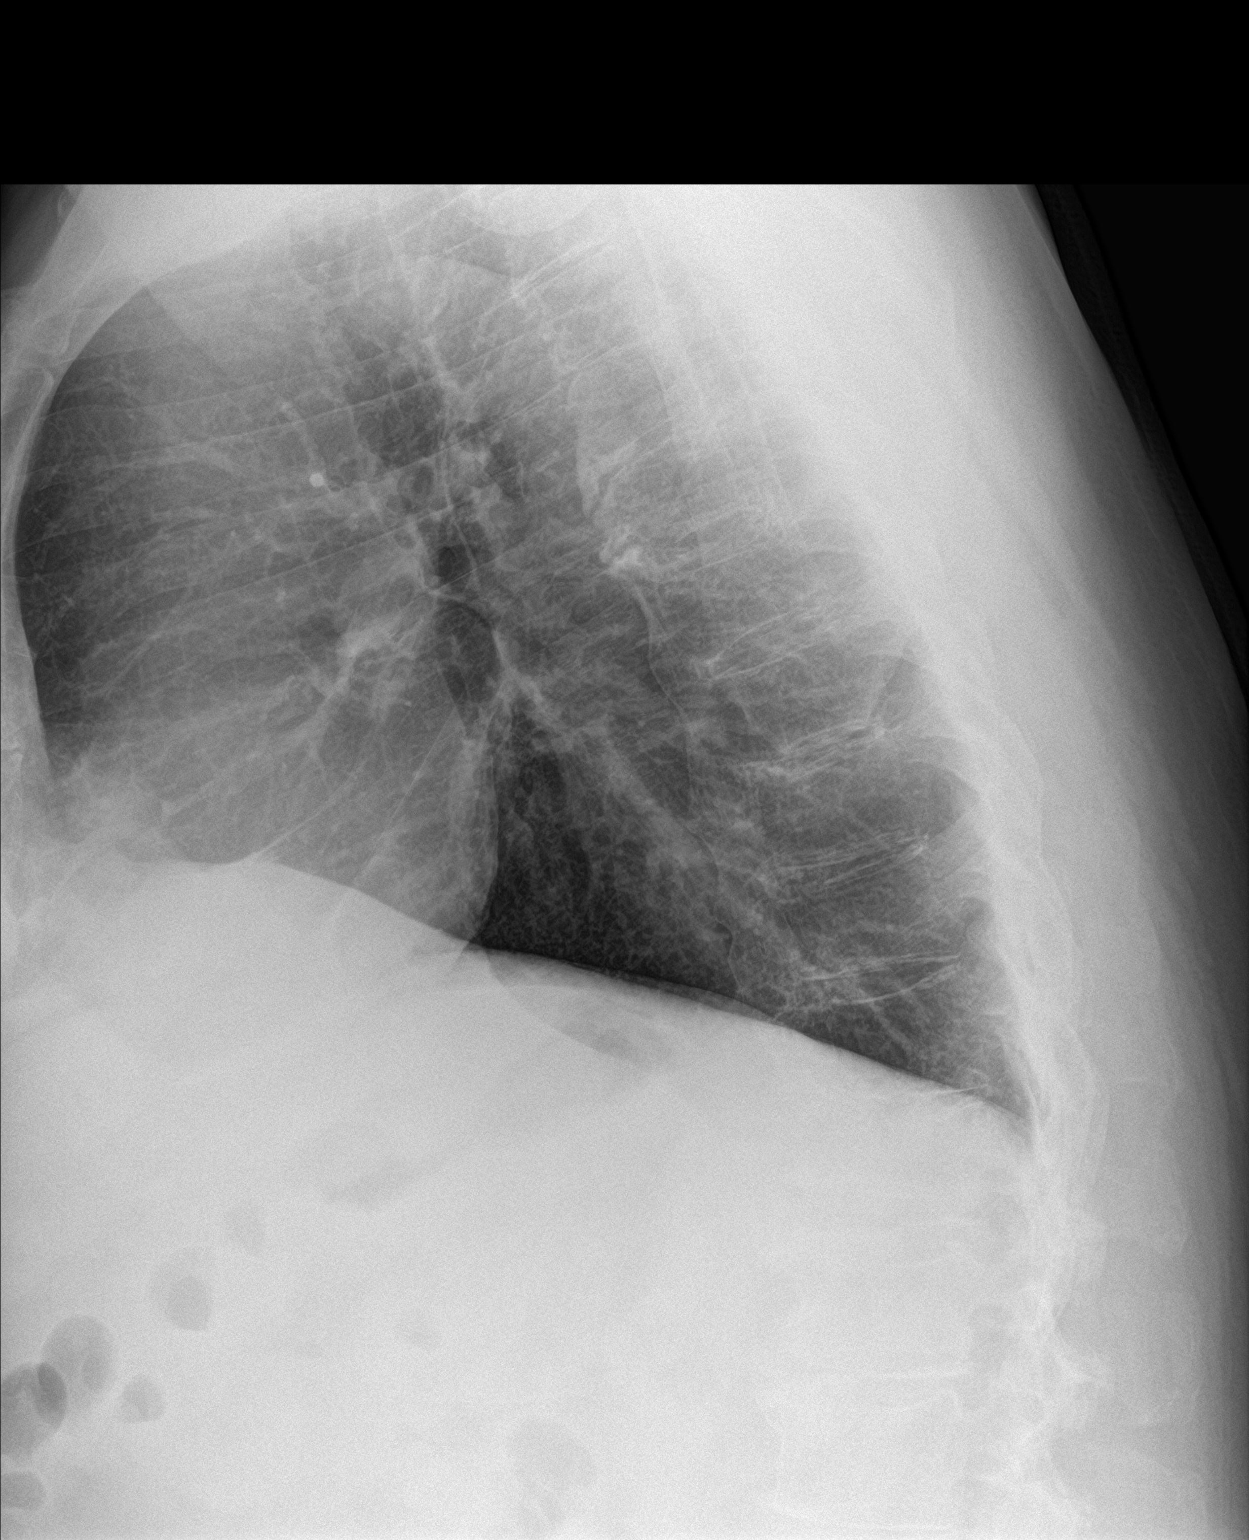

[2 of 2 positions shown; findings below may reference images not displayed]

FINDINGS: The lungs are mildly hyperinflated. There is mild hemidiaphragm
flattening more conspicuous today. There is no alveolar infiltrate
or pleural effusion. There is no pneumothorax or pneumomediastinum.
The heart and pulmonary vascularity are normal. There is chronic
mild wedge compression of T12.
IMPRESSION: Reactive airway disease. Probable acute exacerbation. No alveolar
pneumonia nor CHF.

## 2019-06-28 ENCOUNTER — Other Ambulatory Visit: Payer: Self-pay | Admitting: Internal Medicine

## 2019-07-12 ENCOUNTER — Other Ambulatory Visit: Payer: Self-pay | Admitting: Internal Medicine

## 2019-07-29 ENCOUNTER — Other Ambulatory Visit: Payer: Self-pay | Admitting: Internal Medicine

## 2019-07-31 NOTE — Telephone Encounter (Signed)
Done erx 

## 2019-08-11 ENCOUNTER — Other Ambulatory Visit: Payer: Self-pay | Admitting: Internal Medicine

## 2019-08-11 NOTE — Telephone Encounter (Signed)
Done erx 

## 2019-09-05 DIAGNOSIS — M545 Low back pain: Secondary | ICD-10-CM | POA: Diagnosis not present

## 2019-09-05 DIAGNOSIS — M5416 Radiculopathy, lumbar region: Secondary | ICD-10-CM | POA: Diagnosis not present

## 2019-10-12 DIAGNOSIS — L570 Actinic keratosis: Secondary | ICD-10-CM | POA: Diagnosis not present

## 2019-10-12 DIAGNOSIS — R21 Rash and other nonspecific skin eruption: Secondary | ICD-10-CM | POA: Diagnosis not present

## 2019-10-12 DIAGNOSIS — D225 Melanocytic nevi of trunk: Secondary | ICD-10-CM | POA: Diagnosis not present

## 2019-10-12 DIAGNOSIS — L738 Other specified follicular disorders: Secondary | ICD-10-CM | POA: Diagnosis not present

## 2019-10-12 DIAGNOSIS — D1801 Hemangioma of skin and subcutaneous tissue: Secondary | ICD-10-CM | POA: Diagnosis not present

## 2019-10-31 ENCOUNTER — Other Ambulatory Visit: Payer: Self-pay | Admitting: Internal Medicine

## 2019-10-31 NOTE — Telephone Encounter (Signed)
Done erx 

## 2019-11-06 ENCOUNTER — Other Ambulatory Visit (INDEPENDENT_AMBULATORY_CARE_PROVIDER_SITE_OTHER): Payer: Medicare HMO

## 2019-11-06 DIAGNOSIS — Z0000 Encounter for general adult medical examination without abnormal findings: Secondary | ICD-10-CM | POA: Diagnosis not present

## 2019-11-06 DIAGNOSIS — E559 Vitamin D deficiency, unspecified: Secondary | ICD-10-CM | POA: Diagnosis not present

## 2019-11-06 DIAGNOSIS — E538 Deficiency of other specified B group vitamins: Secondary | ICD-10-CM | POA: Diagnosis not present

## 2019-11-06 DIAGNOSIS — E611 Iron deficiency: Secondary | ICD-10-CM

## 2019-11-06 DIAGNOSIS — R7303 Prediabetes: Secondary | ICD-10-CM | POA: Diagnosis not present

## 2019-11-06 LAB — CBC WITH DIFFERENTIAL/PLATELET
Basophils Absolute: 0.1 10*3/uL (ref 0.0–0.1)
Basophils Relative: 1 % (ref 0.0–3.0)
Eosinophils Absolute: 0.3 10*3/uL (ref 0.0–0.7)
Eosinophils Relative: 2.9 % (ref 0.0–5.0)
HCT: 47.7 % (ref 39.0–52.0)
Hemoglobin: 16.7 g/dL (ref 13.0–17.0)
Lymphocytes Relative: 20.9 % (ref 12.0–46.0)
Lymphs Abs: 1.8 10*3/uL (ref 0.7–4.0)
MCHC: 35.1 g/dL (ref 30.0–36.0)
MCV: 87.9 fl (ref 78.0–100.0)
Monocytes Absolute: 0.6 10*3/uL (ref 0.1–1.0)
Monocytes Relative: 6.8 % (ref 3.0–12.0)
Neutro Abs: 5.9 10*3/uL (ref 1.4–7.7)
Neutrophils Relative %: 68.4 % (ref 43.0–77.0)
Platelets: 284 10*3/uL (ref 150.0–400.0)
RBC: 5.43 Mil/uL (ref 4.22–5.81)
RDW: 13.2 % (ref 11.5–15.5)
WBC: 8.7 10*3/uL (ref 4.0–10.5)

## 2019-11-06 LAB — HEPATIC FUNCTION PANEL
ALT: 18 U/L (ref 0–53)
AST: 14 U/L (ref 0–37)
Albumin: 4.1 g/dL (ref 3.5–5.2)
Alkaline Phosphatase: 73 U/L (ref 39–117)
Bilirubin, Direct: 0.1 mg/dL (ref 0.0–0.3)
Total Bilirubin: 0.8 mg/dL (ref 0.2–1.2)
Total Protein: 6.7 g/dL (ref 6.0–8.3)

## 2019-11-06 LAB — LIPID PANEL
Cholesterol: 125 mg/dL (ref 0–200)
HDL: 30.6 mg/dL — ABNORMAL LOW (ref >39.00)
NonHDL: 94.54
Total CHOL/HDL Ratio: 4
Triglycerides: 207 mg/dL — ABNORMAL HIGH (ref 0.0–149.0)
VLDL: 41.4 mg/dL — ABNORMAL HIGH (ref 0.0–40.0)

## 2019-11-06 LAB — BASIC METABOLIC PANEL
BUN: 10 mg/dL (ref 6–23)
CO2: 30 mEq/L (ref 19–32)
Calcium: 9.1 mg/dL (ref 8.4–10.5)
Chloride: 102 mEq/L (ref 96–112)
Creatinine, Ser: 1.03 mg/dL (ref 0.40–1.50)
GFR: 71.8 mL/min (ref >60.00)
Glucose, Bld: 104 mg/dL — ABNORMAL HIGH (ref 70–99)
Potassium: 3.6 mEq/L (ref 3.5–5.1)
Sodium: 139 mEq/L (ref 135–145)

## 2019-11-06 LAB — LDL CHOLESTEROL, DIRECT: Direct LDL: 65 mg/dL

## 2019-11-06 LAB — IBC PANEL
Iron: 133 ug/dL (ref 42–165)
Saturation Ratios: 37.4 % (ref 20.0–50.0)
Transferrin: 254 mg/dL (ref 212.0–360.0)

## 2019-11-06 LAB — PSA: PSA: 1 ng/mL (ref 0.10–4.00)

## 2019-11-06 LAB — TSH: TSH: 0.57 u[IU]/mL (ref 0.35–4.50)

## 2019-11-06 LAB — HEMOGLOBIN A1C: Hgb A1c MFr Bld: 5.9 % (ref 4.6–6.5)

## 2019-11-07 LAB — URINALYSIS, ROUTINE W REFLEX MICROSCOPIC
Bilirubin Urine: NEGATIVE
Hgb urine dipstick: NEGATIVE
Ketones, ur: NEGATIVE
Leukocytes,Ua: NEGATIVE
Nitrite: NEGATIVE
Specific Gravity, Urine: 1.015 (ref 1.000–1.030)
Total Protein, Urine: NEGATIVE
Urine Glucose: NEGATIVE
Urobilinogen, UA: 0.2 (ref 0.0–1.0)
pH: 6 (ref 5.0–8.0)

## 2019-11-07 LAB — VITAMIN D 25 HYDROXY (VIT D DEFICIENCY, FRACTURES): VITD: 27.58 ng/mL — ABNORMAL LOW (ref 30.00–100.00)

## 2019-11-07 LAB — VITAMIN B12: Vitamin B-12: 139 pg/mL — ABNORMAL LOW (ref 211–911)

## 2019-11-09 ENCOUNTER — Other Ambulatory Visit: Payer: Self-pay

## 2019-11-09 ENCOUNTER — Encounter: Payer: Self-pay | Admitting: Internal Medicine

## 2019-11-09 ENCOUNTER — Ambulatory Visit (INDEPENDENT_AMBULATORY_CARE_PROVIDER_SITE_OTHER): Payer: Medicare HMO | Admitting: Internal Medicine

## 2019-11-09 VITALS — BP 126/84 | HR 70 | Temp 98.0°F | Ht 74.0 in | Wt 239.0 lb

## 2019-11-09 DIAGNOSIS — R7303 Prediabetes: Secondary | ICD-10-CM

## 2019-11-09 DIAGNOSIS — E559 Vitamin D deficiency, unspecified: Secondary | ICD-10-CM | POA: Diagnosis not present

## 2019-11-09 DIAGNOSIS — E538 Deficiency of other specified B group vitamins: Secondary | ICD-10-CM | POA: Diagnosis not present

## 2019-11-09 DIAGNOSIS — Z0000 Encounter for general adult medical examination without abnormal findings: Secondary | ICD-10-CM | POA: Diagnosis not present

## 2019-11-09 NOTE — Patient Instructions (Addendum)
Please take OTC Vitamin D3 at 2000 units per day, indefinitely.  Please also take B12 1000 mcg per day OTC  Please continue all other medications as before, and refills have been done if requested.  Please have the pharmacy call with any other refills you may need.  Please continue your efforts at being more active, low cholesterol diet, and weight control.  You are otherwise up to date with prevention measures today.  Please keep your appointments with your specialists as you may have planned  Please make an Appointment to return in 6 months, or sooner if needed

## 2019-11-09 NOTE — Assessment & Plan Note (Signed)

## 2019-11-09 NOTE — Progress Notes (Signed)
Subjective:    Patient ID: [name] [name], male    DOB: 01-03-1951, 69 y.o.   MRN: [id number]  HPI  Here for wellness and f/u;  Overall doing ok;  Pt denies Chest pain, worsening SOB, DOE, wheezing, orthopnea, PND, worsening LE edema, palpitations, dizziness or syncope.  Pt denies neurological change such as new headache, facial or extremity weakness.  Pt denies polydipsia, polyuria, or low sugar symptoms. Pt states overall good compliance with treatment and medications, good tolerability, and has been trying to follow appropriate diet.  Pt denies worsening depressive symptoms, suicidal ideation or panic. No fever, night sweats, wt loss, loss of appetite, or other constitutional symptoms.  Pt states good ability with ADL's, has low fall risk, home safety reviewed and adequate, no other significant changes in hearing or vision, and only occasionally active with exercise. Slipped on a puppy pee pad and fell to right knee with abrasion and swelling, but getting better.  Sees ortho Dr Tonita Cong for lumbar spinal stenosis, now for pain management with Dr Nelva Bush.   Past Medical History:  Diagnosis Date  . Allergic rhinitis   . Allergy    SEASONAL,USE TO TAKE ALLERGY SHOTS  . Anxiety   . Arthritis    BACK,KNEES  . Asthma, mild   . Borderline diabetes mellitus   . Depression   . Diverticulosis of colon   . Gallstones   . Generalized anxiety disorder   . GERD (gastroesophageal reflux disease)   . Hemorrhoids   . History of colonic polyps   . Hyperlipidemia   . Hypertension   . Lumbar back pain   . Obesity   . Venous insufficiency    Past Surgical History:  Procedure Laterality Date  . COLONOSCOPY    . LUMBAR FUSION    . LUMBAR LAMINECTOMY  [id number]  . POLYPECTOMY      reports that he has never smoked. He has never used smokeless tobacco. He reports that he does not drink alcohol or use drugs. family history includes Colon cancer in his father and maternal grandfather; Diabetes in his  father; Heart attack in his father; Lung cancer in his mother; Prostate cancer in his father; Stroke in his father. No Known Allergies Current Outpatient Medications on File Prior to Visit  Medication Sig Dispense Refill  . albuterol (PROVENTIL HFA;VENTOLIN HFA) 108 (90 Base) MCG/ACT inhaler Inhale 2 puffs into the lungs every 6 (six) hours as needed for wheezing or shortness of breath. 1 Inhaler 5  . ALPRAZolam (XANAX) 0.5 MG tablet TAKE 1 TABLET BY MOUTH THREE TIMES DAILY AS NEEDED FOR ANXIETY 90 tablet 2  . amLODipine (NORVASC) 5 MG tablet TAKE 1 TABLET EVERY DAY 90 tablet 3  . aspirin 81 MG tablet Take 81 mg by mouth daily.      . fluticasone (FLONASE) 50 MCG/ACT nasal spray Use 2 spray(s) in each nostril once daily 16 g 5  . Fluticasone-Salmeterol (ADVAIR DISKUS) 250-50 MCG/DOSE AEPB Inhale 1 puff into the lungs 2 (two) times daily. 60 each 11  . losartan (COZAAR) 50 MG tablet TAKE 1 TABLET EVERY DAY 90 tablet 1  . omeprazole (PRILOSEC) 20 MG capsule TAKE 2 CAPSULES EVERY DAY 180 capsule 1  . traMADol (ULTRAM) 50 MG tablet TAKE 1 TABLET BY MOUTH EVERY 8 HOURS AS NEEDED 90 tablet 2   No current facility-administered medications on file prior to visit.   Review of Systems All otherwise neg per pt     Objective:   Physical Exam  Blood pressure 126/84, pulse 70, temperature 98 F (36.7 C), height 6\' 2"  (1.88 m), weight 239 lb (108.4 kg), SpO2 98 %.  s VS noted,  Constitutional: Pt appears in NAD HENT: Head: NCAT.  Right Ear: External ear normal.  Left Ear: External ear normal.  Eyes: . Pupils are equal, round, and reactive to light. Conjunctivae and EOM are normal Nose: without d/c or deformity Neck: Neck supple. Gross normal ROM Cardiovascular: Normal rate and regular rhythm.   Pulmonary/Chest: Effort normal and breath sounds without rales or wheezing.  Abd:  Soft, NT, ND, + BS, no organomegaly Neurological: Pt is alert. At baseline orientation, motor grossly intact Skin: Skin  is warm. No rashes, other new lesions, no LE edema Psychiatric: Pt behavior is normal without agitation  All otherwise neg per pt Lab Results  Component Value Date   WBC 8.7 11/06/2019   HGB 16.7 11/06/2019   HCT 47.7 11/06/2019   PLT 284.0 11/06/2019   GLUCOSE 104 (H) 11/06/2019   CHOL 125 11/06/2019   TRIG 207.0 (H) 11/06/2019   HDL 30.60 (L) 11/06/2019   LDLDIRECT 65.0 11/06/2019   LDLCALC 51 10/24/2018   ALT 18 11/06/2019   AST 14 11/06/2019   NA 139 11/06/2019   K 3.6 11/06/2019   CL 102 11/06/2019   CREATININE 1.03 11/06/2019   BUN 10 11/06/2019   CO2 30 11/06/2019   TSH 0.57 11/06/2019   PSA 1.00 11/06/2019   INR 1.20 12/06/2015   HGBA1C 5.9 11/06/2019      Assessment & Plan:

## 2019-11-09 NOTE — Assessment & Plan Note (Signed)
For replacement 

## 2019-11-09 NOTE — Assessment & Plan Note (Signed)
stable overall by history and exam, recent data reviewed with pt, and pt to continue medical treatment as before,  to f/u any worsening symptoms or concerns  

## 2019-11-15 ENCOUNTER — Other Ambulatory Visit: Payer: Self-pay | Admitting: Internal Medicine

## 2019-11-15 NOTE — Telephone Encounter (Signed)
Done erx 

## 2019-11-27 ENCOUNTER — Other Ambulatory Visit: Payer: Self-pay | Admitting: Internal Medicine

## 2019-11-27 NOTE — Telephone Encounter (Signed)
Please refill as per office routine med refill policy (all routine meds refilled for 3 mo or monthly per pt preference up to one year from last visit, then month to month grace period for 3 mo, then further med refills will have to be denied)  

## 2019-12-09 ENCOUNTER — Other Ambulatory Visit: Payer: Self-pay | Admitting: Internal Medicine

## 2019-12-10 NOTE — Telephone Encounter (Signed)
Please refill as per office routine med refill policy (all routine meds refilled for 3 mo or monthly per pt preference up to one year from last visit, then month to month grace period for 3 mo, then further med refills will have to be denied)  

## 2020-01-30 ENCOUNTER — Other Ambulatory Visit: Payer: Self-pay | Admitting: Internal Medicine

## 2020-02-18 ENCOUNTER — Other Ambulatory Visit: Payer: Self-pay | Admitting: Internal Medicine

## 2020-02-18 NOTE — Telephone Encounter (Signed)
Done erx 

## 2020-03-18 ENCOUNTER — Other Ambulatory Visit: Payer: Self-pay | Admitting: Internal Medicine

## 2020-03-19 ENCOUNTER — Other Ambulatory Visit: Payer: Self-pay | Admitting: Internal Medicine

## 2020-03-19 ENCOUNTER — Telehealth: Payer: Self-pay | Admitting: Internal Medicine

## 2020-03-19 MED ORDER — ALPRAZOLAM 0.5 MG PO TABS
0.5000 mg | ORAL_TABLET | Freq: Three times a day (TID) | ORAL | 2 refills | Status: DC | PRN
Start: 2020-03-19 — End: 2020-06-21

## 2020-03-19 NOTE — Telephone Encounter (Signed)
Done erx 

## 2020-03-19 NOTE — Telephone Encounter (Signed)
New message:   1.Medication Requested: ALPRAZolam (XANAX) 0.5 MG tablet 2. Pharmacy (Name, Street, [place]): [facility], [place] 3. On Med List: Yes  4. Last Visit with PCP: 11/09/19  5. Next visit date with PCP: 05/13/20   Agent: Please be advised that RX refills may take up to 3 business days. We ask that you follow-up with your pharmacy.

## 2020-03-19 NOTE — Telephone Encounter (Signed)
Sent to Dr. John. 

## 2020-04-17 ENCOUNTER — Telehealth: Payer: Self-pay | Admitting: Internal Medicine

## 2020-04-17 DIAGNOSIS — E785 Hyperlipidemia, unspecified: Secondary | ICD-10-CM

## 2020-04-17 DIAGNOSIS — R7303 Prediabetes: Secondary | ICD-10-CM

## 2020-04-17 DIAGNOSIS — E559 Vitamin D deficiency, unspecified: Secondary | ICD-10-CM

## 2020-04-17 DIAGNOSIS — E538 Deficiency of other specified B group vitamins: Secondary | ICD-10-CM

## 2020-04-17 NOTE — Telephone Encounter (Signed)
Ok labs ordered 

## 2020-04-17 NOTE — Telephone Encounter (Signed)
Pt is requesting that he have labs done before his 05/14/2020 appointment. Please advise

## 2020-05-07 DIAGNOSIS — D2262 Melanocytic nevi of left upper limb, including shoulder: Secondary | ICD-10-CM | POA: Diagnosis not present

## 2020-05-07 DIAGNOSIS — L570 Actinic keratosis: Secondary | ICD-10-CM | POA: Diagnosis not present

## 2020-05-07 DIAGNOSIS — L820 Inflamed seborrheic keratosis: Secondary | ICD-10-CM | POA: Diagnosis not present

## 2020-05-07 DIAGNOSIS — D2261 Melanocytic nevi of right upper limb, including shoulder: Secondary | ICD-10-CM | POA: Diagnosis not present

## 2020-05-09 ENCOUNTER — Other Ambulatory Visit: Payer: Self-pay

## 2020-05-09 ENCOUNTER — Other Ambulatory Visit: Payer: Medicare HMO

## 2020-05-09 DIAGNOSIS — R7303 Prediabetes: Secondary | ICD-10-CM | POA: Diagnosis not present

## 2020-05-09 DIAGNOSIS — E559 Vitamin D deficiency, unspecified: Secondary | ICD-10-CM | POA: Diagnosis not present

## 2020-05-09 DIAGNOSIS — E785 Hyperlipidemia, unspecified: Secondary | ICD-10-CM | POA: Diagnosis not present

## 2020-05-09 DIAGNOSIS — E538 Deficiency of other specified B group vitamins: Secondary | ICD-10-CM

## 2020-05-09 LAB — COMPLETE METABOLIC PANEL WITH GFR
AG Ratio: 1.9 (calc) (ref 1.0–2.5)
ALT: 22 U/L (ref 9–46)
AST: 16 U/L (ref 10–35)
Albumin: 4.2 g/dL (ref 3.6–5.1)
Alkaline phosphatase (APISO): 62 U/L (ref 35–144)
BUN/Creatinine Ratio: 6 (calc) (ref 6–22)
BUN: 6 mg/dL — ABNORMAL LOW (ref 7–25)
CO2: 28 mmol/L (ref 20–32)
Calcium: 9.3 mg/dL (ref 8.6–10.3)
Chloride: 101 mmol/L (ref 98–110)
Creat: 1 mg/dL (ref 0.70–1.25)
GFR, Est African American: 89 mL/min/{1.73_m2} (ref >=60)
GFR, Est Non African American: 77 mL/min/{1.73_m2} (ref >=60)
Globulin: 2.2 g/dL (calc) (ref 1.9–3.7)
Glucose, Bld: 119 mg/dL — ABNORMAL HIGH (ref 65–99)
Potassium: 4 mmol/L (ref 3.5–5.3)
Sodium: 138 mmol/L (ref 135–146)
Total Bilirubin: 0.8 mg/dL (ref 0.2–1.2)
Total Protein: 6.4 g/dL (ref 6.1–8.1)

## 2020-05-10 LAB — HEMOGLOBIN A1C
Hgb A1c MFr Bld: 5.7 % of total Hgb — ABNORMAL HIGH (ref <5.7)
Mean Plasma Glucose: 117 (calc)
eAG (mmol/L): 6.5 (calc)

## 2020-05-10 LAB — LIPID PANEL
Cholesterol: 117 mg/dL (ref <200)
HDL: 31 mg/dL — ABNORMAL LOW (ref >=40)
LDL Cholesterol (Calc): 58 mg/dL (calc)
Non-HDL Cholesterol (Calc): 86 mg/dL (calc) (ref <130)
Total CHOL/HDL Ratio: 3.8 (calc) (ref <5.0)
Triglycerides: 201 mg/dL — ABNORMAL HIGH (ref <150)

## 2020-05-10 LAB — VITAMIN D 25 HYDROXY (VIT D DEFICIENCY, FRACTURES): Vit D, 25-Hydroxy: 77 ng/mL (ref 30–100)

## 2020-05-10 LAB — VITAMIN B12: Vitamin B-12: 327 pg/mL (ref 200–1100)

## 2020-05-13 ENCOUNTER — Ambulatory Visit: Payer: Medicare HMO | Admitting: Internal Medicine

## 2020-05-14 ENCOUNTER — Ambulatory Visit (INDEPENDENT_AMBULATORY_CARE_PROVIDER_SITE_OTHER): Payer: Medicare HMO | Admitting: Internal Medicine

## 2020-05-14 ENCOUNTER — Other Ambulatory Visit: Payer: Self-pay

## 2020-05-14 ENCOUNTER — Encounter: Payer: Self-pay | Admitting: Internal Medicine

## 2020-05-14 ENCOUNTER — Ambulatory Visit (INDEPENDENT_AMBULATORY_CARE_PROVIDER_SITE_OTHER): Payer: Medicare HMO

## 2020-05-14 VITALS — BP 130/90 | HR 68 | Temp 98.4°F | Ht 74.0 in | Wt 234.0 lb

## 2020-05-14 VITALS — BP 130/90 | HR 68 | Temp 98.4°F | Resp 16 | Ht 74.0 in | Wt 234.0 lb

## 2020-05-14 DIAGNOSIS — Z0000 Encounter for general adult medical examination without abnormal findings: Secondary | ICD-10-CM

## 2020-05-14 DIAGNOSIS — E559 Vitamin D deficiency, unspecified: Secondary | ICD-10-CM

## 2020-05-14 DIAGNOSIS — Z23 Encounter for immunization: Secondary | ICD-10-CM | POA: Diagnosis not present

## 2020-05-14 DIAGNOSIS — E538 Deficiency of other specified B group vitamins: Secondary | ICD-10-CM | POA: Diagnosis not present

## 2020-05-14 DIAGNOSIS — K5900 Constipation, unspecified: Secondary | ICD-10-CM

## 2020-05-14 DIAGNOSIS — E785 Hyperlipidemia, unspecified: Secondary | ICD-10-CM

## 2020-05-14 DIAGNOSIS — R7303 Prediabetes: Secondary | ICD-10-CM | POA: Diagnosis not present

## 2020-05-14 DIAGNOSIS — I10 Essential (primary) hypertension: Secondary | ICD-10-CM

## 2020-05-14 NOTE — Patient Instructions (Addendum)
You had the flu shot today  Ok to try the miralax OTC daily for the constipation, as well as OTC stool softner called colace at 100 mg twice per day as needed  Please continue all other medications as before, and refills have been done if requested.  Please have the pharmacy call with any other refills you may need.  Please continue your efforts at being more active, low cholesterol diet, and weight control.  Please keep your appointments with your specialists as you may have planned  Please make an Appointment to return for your 1 year visit, or sooner if needed, with Lab testing by Appointment as well, to be done about 3-5 days before at the [facility] (so this is for TWO appointments - please see the scheduling desk as you leave)

## 2020-05-14 NOTE — Assessment & Plan Note (Signed)
stable overall by history and exam, recent data reviewed with pt, and pt to continue medical treatment as before,  to f/u any worsening symptoms or concerns  

## 2020-05-14 NOTE — Progress Notes (Signed)
Subjective:   [name] [name] is a 69 y.o. male who presents for Medicare Annual/Subsequent preventive examination.  Review of Systems    No ROS. Medicare Wellness Visit Cardiac Risk Factors include: advanced age (>35men, >78 women);family history of premature cardiovascular disease;hypertension;obesity (BMI >30kg/m2)     Objective:    Today's Vitals   05/14/20 1437  BP: 130/90  Pulse: 68  Resp: 16  Temp: 98.4 F (36.9 C)  SpO2: 95%  Weight: 234 lb (106.1 kg)  Height: 6\' 2"  (1.88 m)   Body mass index is 30.04 kg/m.  Advanced Directives 05/14/2020 05/13/2017 04/08/2017 12/07/2015 12/06/2015  Does Patient Have a Medical Advance Directive? Yes No No No No  Type of Paramedic of [place];Living will - - - -  Does patient want to make changes to medical advance directive? No - Patient declined - - - -  Copy of [place] in Chart? No - copy requested - - - -  Would patient like information on creating a medical advance directive? - No - Patient declined - Yes - Educational materials given Yes - Educational materials given    Current Medications (verified) Outpatient Encounter Medications as of 05/14/2020  Medication Sig  . albuterol (PROVENTIL HFA;VENTOLIN HFA) 108 (90 Base) MCG/ACT inhaler Inhale 2 puffs into the lungs every 6 (six) hours as needed for wheezing or shortness of breath.  . ALPRAZolam (XANAX) 0.5 MG tablet Take 1 tablet (0.5 mg total) by mouth 3 (three) times daily as needed for anxiety.  Marland Kitchen amLODipine (NORVASC) 5 MG tablet TAKE 1 TABLET EVERY DAY  . aspirin 81 MG tablet Take 81 mg by mouth daily.    . fluticasone (FLONASE) 50 MCG/ACT nasal spray Use 2 spray(s) in each nostril once daily  . Fluticasone-Salmeterol (ADVAIR DISKUS) 250-50 MCG/DOSE AEPB Inhale 1 puff into the lungs 2 (two) times daily.  Marland Kitchen losartan (COZAAR) 50 MG tablet TAKE 1 TABLET EVERY DAY  . omeprazole (PRILOSEC) 20 MG capsule TAKE 2 CAPSULES EVERY DAY  .  traMADol (ULTRAM) 50 MG tablet TAKE 1 TABLET BY MOUTH EVERY 8 HOURS AS NEEDED   No facility-administered encounter medications on file as of 05/14/2020.    Allergies (verified) Patient has no known allergies.   History: Past Medical History:  Diagnosis Date  . Allergic rhinitis   . Allergy    SEASONAL,USE TO TAKE ALLERGY SHOTS  . Anxiety   . Arthritis    BACK,KNEES  . Asthma, mild   . Borderline diabetes mellitus   . Depression   . Diverticulosis of colon   . Gallstones   . Generalized anxiety disorder   . GERD (gastroesophageal reflux disease)   . Hemorrhoids   . History of colonic polyps   . Hyperlipidemia   . Hypertension   . Lumbar back pain   . Obesity   . Venous insufficiency    Past Surgical History:  Procedure Laterality Date  . COLONOSCOPY    . LUMBAR FUSION    . LUMBAR LAMINECTOMY  [id number]  . POLYPECTOMY     Family History  Problem Relation Age of Onset  . Stroke Father   . Heart attack Father   . Colon cancer Father   . Diabetes Father   . Prostate cancer Father   . Lung cancer Mother   . Colon cancer Maternal Grandfather    Social History   Socioeconomic History  . Marital status: Single    Spouse name: Not on file  .  Number of children: Not on file  . Years of education: Not on file  . Highest education level: Not on file  Occupational History  . Occupation: Proofreader work    Fish farm manager: DISABLED    Comment: polo and replacements  . Occupation: disability    Comment: since 10/2003 due to his back  Tobacco Use  . Smoking status: Never Smoker  . Smokeless tobacco: Never Used  Vaping Use  . Vaping Use: Never used  Substance and Sexual Activity  . Alcohol use: No  . Drug use: No  . Sexual activity: Not on file  Other Topics Concern  . Not on file  Social History Narrative  . Not on file   Social Determinants of Health   Financial Resource Strain:   . Difficulty of Paying Living Expenses: Not on file  Food Insecurity:   . Worried  About Charity fundraiser in the Last Year: Not on file  . Ran Out of Food in the Last Year: Not on file  Transportation Needs:   . Lack of Transportation (Medical): Not on file  . Lack of Transportation (Non-Medical): Not on file  Physical Activity:   . Days of Exercise per Week: Not on file  . Minutes of Exercise per Session: Not on file  Stress:   . Feeling of Stress : Not on file  Social Connections:   . Frequency of Communication with Friends and Family: Not on file  . Frequency of Social Gatherings with Friends and Family: Not on file  . Attends Religious Services: Not on file  . Active Member of Clubs or Organizations: Not on file  . Attends Archivist Meetings: Not on file  . Marital Status: Not on file    Tobacco Counseling Counseling given: Not Answered   Clinical Intake:  Pre-visit preparation completed: Yes  Pain : No/denies pain     BMI - recorded: 30.04 Nutritional Status: BMI > 30  Obese Nutritional Risks: None Diabetes: No  How often do you need to have someone help you when you read instructions, pamphlets, or other written materials from your doctor or pharmacy?: 1 - Never What is the last grade level you completed in school?: [employer] Graduate  Diabetic? no  Interpreter Needed?: No  Information entered by :: [name] Soria N. Raynard Mapps, LPN   Activities of Daily Living In your present state of health, do you have any difficulty performing the following activities: 05/14/2020  Hearing? N  Vision? N  Difficulty concentrating or making decisions? N  Walking or climbing stairs? N  Dressing or bathing? N  Doing errands, shopping? N  Preparing Food and eating ? N  Using the Toilet? N  In the past six months, have you accidently leaked urine? N  Do you have problems with loss of bowel control? N  Managing your Medications? N  Managing your Finances? N  Housekeeping or managing your Housekeeping? N  Some recent data might be hidden    Patient  Care Team: Biagio Borg, MD as PCP - General (Internal Medicine)  Indicate any recent Medical Services you may have received from other than [facility] providers in the past year (date may be approximate).     Assessment:   This is a routine wellness examination for [name] [name].  Hearing/Vision screen No exam data present  Dietary issues and exercise activities discussed: Current Exercise Habits: The patient does not participate in regular exercise at present, Exercise limited by: orthopedic condition(s)  Goals   None  Depression Screen PHQ 2/9 Scores 05/14/2020 11/09/2019 11/02/2018 06/01/2018 11/29/2017 01/06/2017 01/06/2017  PHQ - 2 Score 0 0 0 0 0 3 0  PHQ- 9 Score - - - - - 9 -    Fall Risk Fall Risk  05/14/2020 11/09/2019 11/02/2018 06/01/2018 11/29/2017  Falls in the past year? 0 1 0 No Yes  Number falls in past yr: 0 0 - - 1  Comment - - - - fell tripped doing yardwork  Injury with Fall? 0 0 - - No  Risk for fall due to : No Fall Risks - - - -  Follow up Falls evaluation completed - - - -    Any stairs in or around the home? No  If so, are there any without handrails? No  Home free of loose throw rugs in walkways, pet beds, electrical cords, etc? Yes  Adequate lighting in your home to reduce risk of falls? Yes   ASSISTIVE DEVICES UTILIZED TO PREVENT FALLS:  Life alert? No  Use of a cane, walker or w/c? No  Grab bars in the bathroom? No  Shower chair or bench in shower? No  Elevated toilet seat or a handicapped toilet? No   TIMED UP AND GO:  Was the test performed? No .  Length of time to ambulate 10 feet: 0 sec.   Gait steady and fast without use of assistive device  Cognitive Function:     6CIT Screen 05/14/2020  What Year? 0 points  What month? 0 points  What time? 0 points  Count back from 20 0 points  Months in reverse 0 points  Repeat phrase 0 points  Total Score 0    Immunizations Immunization History  Administered Date(s) Administered  . Fluad Quad(high Dose  65+) 05/12/2019, 05/14/2020  . Influenza Split 05/18/2011, 05/30/2012, 07/11/2012  . Influenza Whole 06/06/2008, 05/29/2009, 06/02/2010  . Influenza, High Dose Seasonal PF 05/31/2017, 06/01/2018  . Influenza,inj,Quad PF,6+ Mos 05/24/2013, 05/31/2014, 06/07/2015, 05/19/2016  . Influenza,inj,quad, With Preservative 05/31/2017  . PFIZER SARS-COV-2 Vaccination 11/20/2019, 12/11/2019  . Pneumococcal Conjugate-13 01/06/2017  . Pneumococcal Polysaccharide-23 06/01/2018  . Pneumococcal-Unspecified 08/31/2016  . Tdap 07/11/2012  . Zoster 01/07/2012    TDAP status: Up to date Flu Vaccine status: Up to date Pneumococcal vaccine status: Up to date Covid-19 vaccine status: Completed vaccines  Qualifies for Shingles Vaccine? Yes   Zostavax completed Yes   Shingrix Completed?: No.    Education has been provided regarding the importance of this vaccine. Patient has been advised to call insurance company to determine out of pocket expense if they have not yet received this vaccine. Advised may also receive vaccine at local pharmacy or Health Dept. Verbalized acceptance and understanding.  Screening Tests Health Maintenance  Topic Date Due  . COLONOSCOPY  11/08/2020 (Originally 03/26/2015)  . TETANUS/TDAP  07/11/2022  . INFLUENZA VACCINE  Completed  . COVID-19 Vaccine  Completed  . Hepatitis C Screening  Completed  . PNA vac Low Risk Adult  Completed    Health Maintenance  There are no preventive care reminders to display for this patient.  Colorectal cancer screening: Completed 03/25/2010. Repeat every 5 years  Lung Cancer Screening: (Low Dose CT Chest recommended if Age 26-80 years, 30 pack-year currently smoking OR have quit w/in 15years.) does not qualify.   Lung Cancer Screening Referral: no  Additional Screening:  Hepatitis C Screening: does qualify; Completed yes  Vision Screening: Recommended annual ophthalmology exams for early detection of glaucoma and other disorders of the  eye. Is the patient up to date with their annual eye exam?  No  Who is the provider or what is the name of the office in which the patient attends annual eye exams? Patient refused referral  If pt is not established with a provider, would they like to be referred to a provider to establish care? No .   Dental Screening: Recommended annual dental exams for proper oral hygiene  Community Resource Referral / Chronic Care Management: CRR required this visit?  No   CCM required this visit?  No      Plan:     I have personally reviewed and noted the following in the patient's chart:   . Medical and social history . Use of alcohol, tobacco or illicit drugs  . Current medications and supplements . Functional ability and status . Nutritional status . Physical activity . Advanced directives . List of other physicians . Hospitalizations, surgeries, and ER visits in previous 12 months . Vitals . Screenings to include cognitive, depression, and falls . Referrals and appointments  In addition, I have reviewed and discussed with patient certain preventive protocols, quality metrics, and best practice recommendations. A written personalized care plan for preventive services as well as general preventive health recommendations were provided to patient.     Sheral Flow, LPN   09/08/3233   Nurse Notes:

## 2020-05-14 NOTE — Progress Notes (Signed)
Subjective:    Patient ID: [name] [name], male    DOB: 1950/10/19, 69 y.o.   MRN: [id number]  HPI  Here to f/u; overall doing ok,  Pt denies chest pain, increasing sob or doe, wheezing, orthopnea, PND, increased LE swelling, palpitations, dizziness or syncope.  Pt denies new neurological symptoms such as new headache, or facial or extremity weakness or numbness.  Pt denies polydipsia, polyuria, or low sugar episode.  Pt states overall good compliance with meds, mostly trying to follow appropriate diet, with wt overall stable,  but little exercise however. Also with epigastric pain dull mild to mod without radiation but can feel some in the right lower and left lower as well, no fever and Denies worsening reflux, dysphagia, n/v, or blood, but has had recurring constipation.   Did have an attempted but not completed colonoscopy with Dr Fuller Plan, and pt now willing to try again and will call on his own for this.   Past Medical History:  Diagnosis Date  . Allergic rhinitis   . Allergy    SEASONAL,USE TO TAKE ALLERGY SHOTS  . Anxiety   . Arthritis    BACK,KNEES  . Asthma, mild   . Borderline diabetes mellitus   . Depression   . Diverticulosis of colon   . Gallstones   . Generalized anxiety disorder   . GERD (gastroesophageal reflux disease)   . Hemorrhoids   . History of colonic polyps   . Hyperlipidemia   . Hypertension   . Lumbar back pain   . Obesity   . Venous insufficiency    Past Surgical History:  Procedure Laterality Date  . COLONOSCOPY    . LUMBAR FUSION    . LUMBAR LAMINECTOMY  [id number]  . POLYPECTOMY      reports that he has never smoked. He has never used smokeless tobacco. He reports that he does not drink alcohol and does not use drugs. family history includes Colon cancer in his father and maternal grandfather; Diabetes in his father; Heart attack in his father; Lung cancer in his mother; Prostate cancer in his father; Stroke in his father. No Known Allergies Current  Outpatient Medications on File Prior to Visit  Medication Sig Dispense Refill  . albuterol (PROVENTIL HFA;VENTOLIN HFA) 108 (90 Base) MCG/ACT inhaler Inhale 2 puffs into the lungs every 6 (six) hours as needed for wheezing or shortness of breath. 1 Inhaler 5  . ALPRAZolam (XANAX) 0.5 MG tablet Take 1 tablet (0.5 mg total) by mouth 3 (three) times daily as needed for anxiety. 90 tablet 2  . amLODipine (NORVASC) 5 MG tablet TAKE 1 TABLET EVERY DAY 90 tablet 3  . aspirin 81 MG tablet Take 81 mg by mouth daily.      . fluticasone (FLONASE) 50 MCG/ACT nasal spray Use 2 spray(s) in each nostril once daily 16 g 5  . Fluticasone-Salmeterol (ADVAIR DISKUS) 250-50 MCG/DOSE AEPB Inhale 1 puff into the lungs 2 (two) times daily. 60 each 11  . losartan (COZAAR) 50 MG tablet TAKE 1 TABLET EVERY DAY 90 tablet 2  . omeprazole (PRILOSEC) 20 MG capsule TAKE 2 CAPSULES EVERY DAY 180 capsule 2  . sennosides-docusate sodium (SENOKOT-S) 8.6-50 MG tablet Take 1 tablet by mouth daily.    . traMADol (ULTRAM) 50 MG tablet TAKE 1 TABLET BY MOUTH EVERY 8 HOURS AS NEEDED 90 tablet 2   No current facility-administered medications on file prior to visit.   Review of Systems All otherwise neg per pt  Objective:   Physical Exam BP 130/90 (BP Location: Left Arm, Patient Position: Sitting, Cuff Size: Large)   Pulse 68   Temp 98.4 F (36.9 C) (Oral)   Ht 6\' 2"  (1.88 m)   Wt 234 lb (106.1 kg)   SpO2 95%   BMI 30.04 kg/m  VS noted,  Constitutional: Pt appears in NAD HENT: Head: NCAT.  Right Ear: External ear normal.  Left Ear: External ear normal.  Eyes: . Pupils are equal, round, and reactive to light. Conjunctivae and EOM are normal Nose: without d/c or deformity Neck: Neck supple. Gross normal ROM Cardiovascular: Normal rate and regular rhythm.   Pulmonary/Chest: Effort normal and breath sounds without rales or wheezing.  Abd:  Soft, NT, ND, + BS, no organomegaly Neurological: Pt is alert. At baseline  orientation, motor grossly intact Skin: Skin is warm. No rashes, other new lesions, no LE edema Psychiatric: Pt behavior is normal without agitation  All otherwise neg per pt  Lab Results  Component Value Date   WBC 8.7 11/06/2019   HGB 16.7 11/06/2019   HCT 47.7 11/06/2019   PLT 284.0 11/06/2019   GLUCOSE 119 (H) 05/09/2020   CHOL 117 05/09/2020   TRIG 201 (H) 05/09/2020   HDL 31 (L) 05/09/2020   LDLDIRECT 65.0 11/06/2019   LDLCALC 58 05/09/2020   ALT 22 05/09/2020   AST 16 05/09/2020   NA 138 05/09/2020   K 4.0 05/09/2020   CL 101 05/09/2020   CREATININE 1.00 05/09/2020   BUN 6 (L) 05/09/2020   CO2 28 05/09/2020   TSH 0.57 11/06/2019   PSA 1.00 11/06/2019   INR 1.20 12/06/2015   HGBA1C 5.7 (H) 05/09/2020        Assessment & Plan:

## 2020-05-14 NOTE — Assessment & Plan Note (Addendum)
Advised for miralax otc daily prn, and colace 100 bid prn  I spent 31 minutes in preparing to see the patient by review of recent labs, imaging and procedures, obtaining and reviewing separately obtained history, communicating with the patient and family or caregiver, ordering medications, tests or procedures, and documenting clinical information in the EHR including the differential Dx, treatment, and any further evaluation and other management of constipation, htn, hld, predm

## 2020-05-19 ENCOUNTER — Other Ambulatory Visit: Payer: Self-pay | Admitting: Internal Medicine

## 2020-05-19 NOTE — Telephone Encounter (Signed)
Done erx 

## 2020-06-15 ENCOUNTER — Ambulatory Visit: Payer: Medicare HMO | Attending: Internal Medicine

## 2020-06-15 DIAGNOSIS — Z23 Encounter for immunization: Secondary | ICD-10-CM

## 2020-06-20 ENCOUNTER — Telehealth: Payer: Self-pay | Admitting: Internal Medicine

## 2020-06-20 NOTE — Telephone Encounter (Signed)
   1.Medication Requested: ALPRAZolam (XANAX) 0.5 MG tablet traMADol (ULTRAM) 50 MG tablet   2. Pharmacy (Name, Street, [place]):[facility], [place]  3. On Med List: yes  4. Last Visit with PCP: 05/14/20  5. Next visit date with PCP:  11/11/20   Agent: Please be advised that RX refills may take up to 3 business days. We ask that you follow-up with your pharmacy.

## 2020-06-21 MED ORDER — TRAMADOL HCL 50 MG PO TABS
50.0000 mg | ORAL_TABLET | Freq: Three times a day (TID) | ORAL | 2 refills | Status: DC | PRN
Start: 2020-06-21 — End: 2020-09-26

## 2020-06-21 MED ORDER — ALPRAZOLAM 0.5 MG PO TABS: 0.5000 mg | ORAL_TABLET | Freq: Three times a day (TID) | ORAL | 2 refills | Status: DC | PRN

## 2020-06-21 NOTE — Telephone Encounter (Signed)
Done erx 

## 2020-06-25 DIAGNOSIS — L821 Other seborrheic keratosis: Secondary | ICD-10-CM | POA: Diagnosis not present

## 2020-06-25 DIAGNOSIS — L738 Other specified follicular disorders: Secondary | ICD-10-CM | POA: Diagnosis not present

## 2020-07-03 ENCOUNTER — Ambulatory Visit: Payer: Medicare HMO | Admitting: Physician Assistant

## 2020-07-03 ENCOUNTER — Encounter: Payer: Self-pay | Admitting: Physician Assistant

## 2020-07-03 VITALS — HR 67 | Ht 74.0 in | Wt 234.0 lb

## 2020-07-03 DIAGNOSIS — K5900 Constipation, unspecified: Secondary | ICD-10-CM | POA: Diagnosis not present

## 2020-07-03 DIAGNOSIS — Z800 Family history of malignant neoplasm of digestive organs: Secondary | ICD-10-CM | POA: Diagnosis not present

## 2020-07-03 NOTE — Progress Notes (Signed)
Reviewed and agree with management plan.  Taylar Hartsough T. Megham Dwyer, MD FACG [facility]  

## 2020-07-03 NOTE — Progress Notes (Signed)
Chief Complaint: Constipation  HPI:    [name] [name] is a 69 year old male with a past medical history as listed below including a family history of colon cancer in his father, known to Dr. Fuller Plan, who was referred to me by Biagio Borg, MD for a complaint of constipation.     03/25/2010 colonoscopy done for a family history of colon cancer and personal history of colon polyps.  At that time there were diverticula scattered in the transverse colon, moderate diverticulosis in the sigmoid colon, 4 mm sessile polyp in the distal transverse colon and internal hemorrhoids.  Repeat was recommended in 5 years.    03/10/2017 patient in office visit with [name] [name], at that time patient described constipation and usually extracting stool.  At that time he had a bowel purge MiraLAX 3 times daily over the next 3 days and also scheduled for a colonoscopy.    05/13/2017 colonoscopy aborted due to some shortness of breath and chest tightness.    05/14/2020 patient saw PCP and discuss constipation.  That time was advised to use MiraLAX over-the-counter daily and Colace 100 twice daily as needed.    Today, the patient is again hard to get a history from as he seems to have difficulty giving direct answers without excessive detail.  He tells me that he continues with constipation, currently using Senokot 1 tab a day which seems to help things but occasionally things get severe has to digitally disimpact, this is no different from when he was seen 3 years ago.  Also tells me he knows he is due for a colonoscopy, has a family history of colon cancer.  Tells me that he got "nervous" at the last one and had to be sent to the hospital he is hoping they will get this done now.    Denies fever, chills or blood in his stool.  Past Medical History:  Diagnosis Date  . Allergic rhinitis   . Allergy    SEASONAL,USE TO TAKE ALLERGY SHOTS  . Anxiety   . Arthritis    BACK,KNEES  . Asthma, mild   . Borderline diabetes mellitus    . Depression   . Diverticulosis of colon   . Gallstones   . Generalized anxiety disorder   . GERD (gastroesophageal reflux disease)   . Hemorrhoids   . History of colonic polyps   . Hyperlipidemia   . Hypertension   . Lumbar back pain   . Obesity   . Venous insufficiency     Past Surgical History:  Procedure Laterality Date  . COLONOSCOPY    . LUMBAR FUSION    . LUMBAR LAMINECTOMY  [id number]  . POLYPECTOMY      Current Outpatient Medications  Medication Sig Dispense Refill  . albuterol (PROVENTIL HFA;VENTOLIN HFA) 108 (90 Base) MCG/ACT inhaler Inhale 2 puffs into the lungs every 6 (six) hours as needed for wheezing or shortness of breath. 1 Inhaler 5  . ALPRAZolam (XANAX) 0.5 MG tablet Take 1 tablet (0.5 mg total) by mouth 3 (three) times daily as needed for anxiety. 90 tablet 2  . amLODipine (NORVASC) 5 MG tablet TAKE 1 TABLET EVERY DAY 90 tablet 3  . aspirin 81 MG tablet Take 81 mg by mouth daily.      . fluticasone (FLONASE) 50 MCG/ACT nasal spray Use 2 spray(s) in each nostril once daily 16 g 5  . Fluticasone-Salmeterol (ADVAIR DISKUS) 250-50 MCG/DOSE AEPB Inhale 1 puff into the lungs 2 (two) times daily. 60 each  11  . losartan (COZAAR) 50 MG tablet TAKE 1 TABLET EVERY DAY 90 tablet 2  . omeprazole (PRILOSEC) 20 MG capsule TAKE 2 CAPSULES EVERY DAY 180 capsule 2  . sennosides-docusate sodium (SENOKOT-S) 8.6-50 MG tablet Take 1 tablet by mouth daily.    . traMADol (ULTRAM) 50 MG tablet Take 1 tablet (50 mg total) by mouth every 8 (eight) hours as needed. 90 tablet 2   No current facility-administered medications for this visit.    Allergies as of 07/03/2020  . (No Known Allergies)    Family History  Problem Relation Age of Onset  . Stroke Father   . Heart attack Father   . Colon cancer Father   . Diabetes Father   . Prostate cancer Father   . Lung cancer Mother   . Colon cancer Maternal Grandfather     Social History   Socioeconomic History  . Marital  status: Single    Spouse name: Not on file  . Number of children: Not on file  . Years of education: Not on file  . Highest education level: Not on file  Occupational History  . Occupation: Proofreader work    Fish farm manager: DISABLED    Comment: polo and replacements  . Occupation: disability    Comment: since 10/2003 due to his back  Tobacco Use  . Smoking status: Never Smoker  . Smokeless tobacco: Never Used  Vaping Use  . Vaping Use: Never used  Substance and Sexual Activity  . Alcohol use: No  . Drug use: No  . Sexual activity: Not on file  Other Topics Concern  . Not on file  Social History Narrative  . Not on file   Social Determinants of Health   Financial Resource Strain:   . Difficulty of Paying Living Expenses: Not on file  Food Insecurity:   . Worried About Charity fundraiser in the Last Year: Not on file  . Ran Out of Food in the Last Year: Not on file  Transportation Needs:   . Lack of Transportation (Medical): Not on file  . Lack of Transportation (Non-Medical): Not on file  Physical Activity:   . Days of Exercise per Week: Not on file  . Minutes of Exercise per Session: Not on file  Stress:   . Feeling of Stress : Not on file  Social Connections:   . Frequency of Communication with Friends and Family: Not on file  . Frequency of Social Gatherings with Friends and Family: Not on file  . Attends Religious Services: Not on file  . Active Member of Clubs or Organizations: Not on file  . Attends Archivist Meetings: Not on file  . Marital Status: Not on file  Intimate Partner Violence:   . Fear of Current or Ex-Partner: Not on file  . Emotionally Abused: Not on file  . Physically Abused: Not on file  . Sexually Abused: Not on file    Review of Systems:    Constitutional: No weight loss, fever or chills Skin: No rash Cardiovascular: No chest pain Respiratory: No SOB  Gastrointestinal: See HPI and otherwise negative Genitourinary: No dysuria    Neurological: No headache, dizziness or syncope Musculoskeletal: No new muscle or joint pain Hematologic: No bleeding  Psychiatric: No history of depression or anxiety   Physical Exam:  Vital signs: Pulse 67   Ht 6\' 2"  (1.88 m)   Wt 234 lb (106.1 kg)   SpO2 97%   BMI 30.04 kg/m   Constitutional:  Pleasant Caucasian male appears to be in NAD, Well developed, Well nourished, alert and cooperative Head:  Normocephalic and atraumatic. Eyes:   PEERL, EOMI. No icterus. Conjunctiva pink. Ears:  Normal auditory acuity. Neck:  Supple Throat: Oral cavity and pharynx without inflammation, swelling or lesion.  Respiratory: Respirations even and unlabored. Lungs clear to auscultation bilaterally.   No wheezes, crackles, or rhonchi.  Cardiovascular: Normal S1, S2. No MRG. Regular rate and rhythm. No peripheral edema, cyanosis or pallor.  Gastrointestinal:  Soft, nondistended, nontender. No rebound or guarding. Normal bowel sounds. No appreciable masses or hepatomegaly. Rectal:  Not performed.  Msk:  Symmetrical without gross deformities. Without edema, no deformity or joint abnormality.  Neurologic:  Alert and  oriented x4;  grossly normal neurologically.  Skin:   Dry and intact without significant lesions or rashes. Psychiatric: Speaks histrionically and tangentially, unable to have a direct conversation  RELEVANT LABS AND IMAGING: CBC    Component Value Date/Time   WBC 8.7 11/06/2019 1631   RBC 5.43 11/06/2019 1631   HGB 16.7 11/06/2019 1631   HGB 16.6 10/08/2017 0943   HGB 16.7 04/01/2017 1005   HCT 47.7 11/06/2019 1631   HCT 47.7 04/01/2017 1005   PLT 284.0 11/06/2019 1631   PLT 261 10/08/2017 0943   PLT 231 04/01/2017 1005   MCV 87.9 11/06/2019 1631   MCV 90.2 04/01/2017 1005   MCH 30.0 10/07/2018 1045   MCHC 35.1 11/06/2019 1631   RDW 13.2 11/06/2019 1631   RDW 12.6 04/01/2017 1005   LYMPHSABS 1.8 11/06/2019 1631   LYMPHSABS 2.0 04/01/2017 1005   MONOABS 0.6 11/06/2019  1631   MONOABS 0.8 04/01/2017 1005   EOSABS 0.3 11/06/2019 1631   EOSABS 0.3 04/01/2017 1005   BASOSABS 0.1 11/06/2019 1631   BASOSABS 0.0 04/01/2017 1005    CMP     Component Value Date/Time   NA 138 05/09/2020 1513   NA 140 04/01/2017 1005   K 4.0 05/09/2020 1513   K 4.0 04/01/2017 1005   CL 101 05/09/2020 1513   CO2 28 05/09/2020 1513   CO2 28 04/01/2017 1005   GLUCOSE 119 (H) 05/09/2020 1513   GLUCOSE 123 04/01/2017 1005   BUN 6 (L) 05/09/2020 1513   BUN 10.4 04/01/2017 1005   CREATININE 1.00 05/09/2020 1513   CREATININE 1.0 04/01/2017 1005   CALCIUM 9.3 05/09/2020 1513   CALCIUM 9.3 04/01/2017 1005   PROT 6.4 05/09/2020 1513   PROT 6.8 04/01/2017 1005   ALBUMIN 4.1 11/06/2019 1631   ALBUMIN 3.7 04/01/2017 1005   AST 16 05/09/2020 1513   AST 17 10/07/2018 1045   AST 18 04/01/2017 1005   ALT 22 05/09/2020 1513   ALT 23 10/07/2018 1045   ALT 24 04/01/2017 1005   ALKPHOS 73 11/06/2019 1631   ALKPHOS 68 04/01/2017 1005   BILITOT 0.8 05/09/2020 1513   BILITOT 0.6 10/07/2018 1045   BILITOT 0.84 04/01/2017 1005   GFRNONAA 77 05/09/2020 1513   GFRAA 89 05/09/2020 1513    Assessment: 1.  Chronic constipation: Since at least 2018 when he was seen in clinic last, some better with Senokot daily now 2.  Family history of colon cancer: Patient is overdue for a surveillance colonoscopy  Plan: 1.  Tried to speak to the patient about daily MiraLAX but he talked over me and I am not sure he heard what I was trying to say.  He hardly took a break from speaking in order for me to tell him  what we are going to do. 2.  Patient will be scheduled for a colonoscopy.  He wanted to wait until January timeframe because he does not have a driver at this point in time and wants to have time to call the various services he knows of to see if he can arrange something.  My CMA Nira Conn today has sent herself a reminder to call him back to schedule.  This procedure may require extra time when  done due to the patient's tangential speaking and indirect answers. 3.  Patient has had his Covid vaccines. 4.  Patient to follow in clinic per recommendations after time of colonoscopy.  Ellouise Newer, PA-C [facility] 07/03/2020, 2:57 PM  Cc: Biagio Borg, MD

## 2020-07-03 NOTE — Patient Instructions (Addendum)
If you are age 68 or older, your body mass index should be between 23-30. Your Body mass index is 30.04 kg/m. If this is out of the aforementioned range listed, please consider follow up with your Primary Care Provider.  If you are age 56 or younger, your body mass index should be between 19-25. Your Body mass index is 30.04 kg/m. If this is out of the aformentioned range listed, please consider follow up with your Primary Care Provider.   Will call back week after Thanksgiving to schedule colonoscopy for January.

## 2020-08-26 ENCOUNTER — Encounter: Payer: Self-pay | Admitting: Gastroenterology

## 2020-08-30 ENCOUNTER — Other Ambulatory Visit: Payer: Self-pay | Admitting: Internal Medicine

## 2020-08-31 NOTE — Telephone Encounter (Signed)
Please refill as per office routine med refill policy (all routine meds refilled for 3 mo or monthly per pt preference up to one year from last visit, then month to month grace period for 3 mo, then further med refills will have to be denied)  

## 2020-09-25 ENCOUNTER — Telehealth: Payer: Self-pay | Admitting: Internal Medicine

## 2020-09-25 NOTE — Telephone Encounter (Signed)
1.Medication Requested: ALPRAZolam (XANAX) 0.5 MG tablet  traMADol (ULTRAM) 50 MG tablet  fluticasone (FLONASE) 50 MCG/ACT nasal spray    2. Pharmacy (Name, Street, [place]): [facility]  3. On Med List: yes   4. Last Visit with PCP: 9.14.21  5. Next visit date with PCP: 3.14.22   Agent: Please be advised that RX refills may take up to 3 business days. We ask that you follow-up with your pharmacy.

## 2020-09-26 MED ORDER — TRAMADOL HCL 50 MG PO TABS
50.0000 mg | ORAL_TABLET | Freq: Three times a day (TID) | ORAL | 2 refills | Status: DC | PRN
Start: 2020-09-26 — End: 2020-12-25

## 2020-09-26 MED ORDER — ALPRAZOLAM 0.5 MG PO TABS: 0.5000 mg | ORAL_TABLET | Freq: Three times a day (TID) | ORAL | 2 refills | Status: DC | PRN

## 2020-09-27 MED ORDER — FLUTICASONE PROPIONATE 50 MCG/ACT NA SUSP: NASAL | 5 refills | Status: DC

## 2020-09-27 NOTE — Addendum Note (Signed)
Addended by: Shirlyn Goltz on: 09/27/2020 12:22 PM   Modules accepted: Orders

## 2020-09-27 NOTE — Telephone Encounter (Signed)
Patient flonase has now been filled. I mistaking did not fill it with the other. Called to make patient aware it has now been filled however the phone rang a busy signal.

## 2020-09-27 NOTE — Telephone Encounter (Signed)
fluticasone (FLONASE) 50 MCG/ACT nasal spray Patient wondering why this medication wasn't sent in

## 2020-10-18 ENCOUNTER — Encounter: Payer: Self-pay | Admitting: Gastroenterology

## 2020-11-07 ENCOUNTER — Other Ambulatory Visit (INDEPENDENT_AMBULATORY_CARE_PROVIDER_SITE_OTHER): Payer: Medicare HMO

## 2020-11-07 DIAGNOSIS — R7303 Prediabetes: Secondary | ICD-10-CM

## 2020-11-07 DIAGNOSIS — Z0000 Encounter for general adult medical examination without abnormal findings: Secondary | ICD-10-CM

## 2020-11-07 DIAGNOSIS — Z125 Encounter for screening for malignant neoplasm of prostate: Secondary | ICD-10-CM

## 2020-11-07 DIAGNOSIS — E538 Deficiency of other specified B group vitamins: Secondary | ICD-10-CM | POA: Diagnosis not present

## 2020-11-07 DIAGNOSIS — E559 Vitamin D deficiency, unspecified: Secondary | ICD-10-CM | POA: Diagnosis not present

## 2020-11-07 LAB — CBC WITH DIFFERENTIAL/PLATELET
Basophils Absolute: 0 10*3/uL (ref 0.0–0.1)
Basophils Relative: 0.7 % (ref 0.0–3.0)
Eosinophils Absolute: 0.3 10*3/uL (ref 0.0–0.7)
Eosinophils Relative: 4.1 % (ref 0.0–5.0)
HCT: 46.3 % (ref 39.0–52.0)
Hemoglobin: 16.1 g/dL (ref 13.0–17.0)
Lymphocytes Relative: 23.5 % (ref 12.0–46.0)
Lymphs Abs: 1.6 10*3/uL (ref 0.7–4.0)
MCHC: 34.8 g/dL (ref 30.0–36.0)
MCV: 89.5 fl (ref 78.0–100.0)
Monocytes Absolute: 0.5 10*3/uL (ref 0.1–1.0)
Monocytes Relative: 7.1 % (ref 3.0–12.0)
Neutro Abs: 4.4 10*3/uL (ref 1.4–7.7)
Neutrophils Relative %: 64.6 % (ref 43.0–77.0)
Platelets: 258 10*3/uL (ref 150.0–400.0)
RBC: 5.18 Mil/uL (ref 4.22–5.81)
RDW: 13.1 % (ref 11.5–15.5)
WBC: 6.8 10*3/uL (ref 4.0–10.5)

## 2020-11-07 LAB — LIPID PANEL
Cholesterol: 118 mg/dL (ref 0–200)
HDL: 33.5 mg/dL — ABNORMAL LOW (ref >39.00)
NonHDL: 84.24
Total CHOL/HDL Ratio: 4
Triglycerides: 206 mg/dL — ABNORMAL HIGH (ref 0.0–149.0)
VLDL: 41.2 mg/dL — ABNORMAL HIGH (ref 0.0–40.0)

## 2020-11-07 LAB — URINALYSIS, ROUTINE W REFLEX MICROSCOPIC
Bilirubin Urine: NEGATIVE
Hgb urine dipstick: NEGATIVE
Ketones, ur: NEGATIVE
Leukocytes,Ua: NEGATIVE
Nitrite: NEGATIVE
RBC / HPF: NONE SEEN (ref 0–?)
Specific Gravity, Urine: <=1.005 — AB (ref 1.000–1.030)
Total Protein, Urine: NEGATIVE
Urine Glucose: NEGATIVE
Urobilinogen, UA: 0.2 (ref 0.0–1.0)
WBC, UA: NONE SEEN (ref 0–?)
pH: 6 (ref 5.0–8.0)

## 2020-11-07 LAB — COMPREHENSIVE METABOLIC PANEL
ALT: 22 U/L (ref 0–53)
AST: 18 U/L (ref 0–37)
Albumin: 4 g/dL (ref 3.5–5.2)
Alkaline Phosphatase: 58 U/L (ref 39–117)
BUN: 8 mg/dL (ref 6–23)
CO2: 31 mEq/L (ref 19–32)
Calcium: 9.3 mg/dL (ref 8.4–10.5)
Chloride: 102 mEq/L (ref 96–112)
Creatinine, Ser: 1.02 mg/dL (ref 0.40–1.50)
GFR: 75.22 mL/min (ref >60.00)
Glucose, Bld: 109 mg/dL — ABNORMAL HIGH (ref 70–99)
Potassium: 3.9 mEq/L (ref 3.5–5.1)
Sodium: 139 mEq/L (ref 135–145)
Total Bilirubin: 0.5 mg/dL (ref 0.2–1.2)
Total Protein: 6.4 g/dL (ref 6.0–8.3)

## 2020-11-07 LAB — TSH: TSH: 0.64 u[IU]/mL (ref 0.35–4.50)

## 2020-11-07 LAB — VITAMIN B12: Vitamin B-12: 276 pg/mL (ref 211–911)

## 2020-11-07 LAB — PSA: PSA: 1.01 ng/mL (ref 0.10–4.00)

## 2020-11-07 LAB — LDL CHOLESTEROL, DIRECT: Direct LDL: 62 mg/dL

## 2020-11-07 LAB — HEMOGLOBIN A1C: Hgb A1c MFr Bld: 5.9 % (ref 4.6–6.5)

## 2020-11-07 LAB — VITAMIN D 25 HYDROXY (VIT D DEFICIENCY, FRACTURES): VITD: 78.36 ng/mL (ref 30.00–100.00)

## 2020-11-07 NOTE — Addendum Note (Signed)
Addended by: Trenda Moots on: 5/67/0141 03:14 PM   Modules accepted: Orders

## 2020-11-11 ENCOUNTER — Other Ambulatory Visit: Payer: Self-pay

## 2020-11-11 ENCOUNTER — Ambulatory Visit (INDEPENDENT_AMBULATORY_CARE_PROVIDER_SITE_OTHER): Payer: Medicare HMO | Admitting: Internal Medicine

## 2020-11-11 ENCOUNTER — Encounter: Payer: Self-pay | Admitting: Internal Medicine

## 2020-11-11 VITALS — BP 132/86 | HR 80 | Temp 98.2°F | Ht 74.0 in | Wt 234.0 lb

## 2020-11-11 DIAGNOSIS — R7303 Prediabetes: Secondary | ICD-10-CM | POA: Diagnosis not present

## 2020-11-11 DIAGNOSIS — E7800 Pure hypercholesterolemia, unspecified: Secondary | ICD-10-CM | POA: Diagnosis not present

## 2020-11-11 DIAGNOSIS — J309 Allergic rhinitis, unspecified: Secondary | ICD-10-CM

## 2020-11-11 DIAGNOSIS — E538 Deficiency of other specified B group vitamins: Secondary | ICD-10-CM | POA: Diagnosis not present

## 2020-11-11 DIAGNOSIS — K5900 Constipation, unspecified: Secondary | ICD-10-CM | POA: Diagnosis not present

## 2020-11-11 DIAGNOSIS — E559 Vitamin D deficiency, unspecified: Secondary | ICD-10-CM

## 2020-11-11 DIAGNOSIS — M79604 Pain in right leg: Secondary | ICD-10-CM | POA: Diagnosis not present

## 2020-11-11 DIAGNOSIS — E781 Pure hyperglyceridemia: Secondary | ICD-10-CM

## 2020-11-11 DIAGNOSIS — Z0001 Encounter for general adult medical examination with abnormal findings: Secondary | ICD-10-CM

## 2020-11-11 DIAGNOSIS — Z0000 Encounter for general adult medical examination without abnormal findings: Secondary | ICD-10-CM | POA: Diagnosis not present

## 2020-11-11 MED ORDER — FLUTICASONE PROPIONATE 50 MCG/ACT NA SUSP: NASAL | 5 refills | Status: AC

## 2020-11-11 NOTE — Assessment & Plan Note (Signed)
[place] for ot glycerin suppos's

## 2020-11-11 NOTE — Assessment & Plan Note (Signed)
Lab Results  Component Value Date   LDLCALC 58 05/09/2020   Stable, pt to consider statin  But declines for now

## 2020-11-11 NOTE — Assessment & Plan Note (Signed)
For lower fat diet

## 2020-11-11 NOTE — Assessment & Plan Note (Signed)
Lab Results  Component Value Date   VITAMINB12 276 11/07/2020   Stable, cont oral replacement - b12 1000 mcg qd

## 2020-11-11 NOTE — Assessment & Plan Note (Signed)
Lab Results  Component Value Date   HGBA1C 5.9 11/07/2020   Stable, pt to continue current medical treatment  - diet and wt control

## 2020-11-11 NOTE — Patient Instructions (Signed)
Ok for the flonase to be sent to the mail in pharmacy  Please follow lower fat, lower cholesterol diet  You will be contacted regarding the referral for: leg circulation testing  Please continue all other medications as before, and refills have been done if requested.  Please have the pharmacy call with any other refills you may need.  Please continue your efforts at being more active, low cholesterol diet, and weight control.  You are otherwise up to date with prevention measures today.  Please keep your appointments with your specialists as you may have planned  Please make an Appointment to return in 6 months, or sooner if needed

## 2020-11-11 NOTE — Assessment & Plan Note (Signed)
Ok to restart flonase asd 

## 2020-11-11 NOTE — Progress Notes (Signed)
Patient ID: BYNUM MCCULLARS, male   DOB: [birth year], 70 y.o.   MRN: [id number]         Chief Complaint:: wellness exam and high triglycerides, allergies, constipatoiin, and right leg pain       HPI:  [name] [name] is a 70 y.o. male here for wellness exam; has colonoscopy scheduled f/u in April 2022, o/w up to date with preventive referrals and immunizations                        Also has ongoign mild intermittent crampy pain he attributes to constipation for 2-3 wks without fever, n/v, or blood.  Nothing seems to make better or worse.  Also c/o right leg discomfort below the knee x 2 mo, worse to walk, better to rest, dull, nothing else makes better or worse.  Never smoked.  Does have several wks ongoing nasal allergy symptoms with clearish congestion, itch and sneezing, without fever, pain, ST, cough, swelling or wheezing.  Has not been using his flonase  Has not been following low fat chol diet recently   Wt Readings from Last 3 Encounters:  11/11/20 234 lb (106.1 kg)  07/03/20 234 lb (106.1 kg)  05/14/20 234 lb (106.1 kg)   BP Readings from Last 3 Encounters:  11/11/20 132/86  05/14/20 130/90  05/14/20 130/90   Immunization History  Administered Date(s) Administered  . Fluad Quad(high Dose 65+) 05/12/2019, 05/14/2020  . Influenza Split 05/18/2011, 05/30/2012, 07/11/2012  . Influenza Whole 06/06/2008, 05/29/2009, 06/02/2010  . Influenza, High Dose Seasonal PF 05/31/2017, 06/01/2018  . Influenza,inj,Quad PF,6+ Mos 05/24/2013, 05/31/2014, 06/07/2015, 05/19/2016  . Influenza,inj,quad, With Preservative 05/31/2017  . PFIZER(Purple Top)SARS-COV-2 Vaccination 11/20/2019, 12/11/2019, 06/15/2020  . Pneumococcal Conjugate-13 01/06/2017  . Pneumococcal Polysaccharide-23 06/01/2018  . Pneumococcal-Unspecified 08/31/2016  . Tdap 07/11/2012  . Zoster 01/07/2012   There are no preventive care reminders to display for this patient.    Past Medical History:  Diagnosis Date  . Allergic  rhinitis   . Allergy    SEASONAL,USE TO TAKE ALLERGY SHOTS  . Anxiety   . Arthritis    BACK,KNEES  . Asthma, mild   . Borderline diabetes mellitus   . Depression   . Diverticulosis of colon   . Gallstones   . Generalized anxiety disorder   . GERD (gastroesophageal reflux disease)   . Hemorrhoids   . History of colonic polyps   . Hyperlipidemia   . Hypertension   . Lumbar back pain   . Obesity   . Venous insufficiency    Past Surgical History:  Procedure Laterality Date  . COLONOSCOPY    . LUMBAR FUSION    . LUMBAR LAMINECTOMY  [id number]  . POLYPECTOMY      reports that he has never smoked. He has never used smokeless tobacco. He reports that he does not drink alcohol and does not use drugs. family history includes Colon cancer in his father and maternal grandfather; Diabetes in his father; Heart attack in his father; Lung cancer in his mother; Prostate cancer in his father; Stroke in his father. No Known Allergies Current Outpatient Medications on File Prior to Visit  Medication Sig Dispense Refill  . albuterol (PROVENTIL HFA;VENTOLIN HFA) 108 (90 Base) MCG/ACT inhaler Inhale 2 puffs into the lungs every 6 (six) hours as needed for wheezing or shortness of breath. 1 Inhaler 5  . ALPRAZolam (XANAX) 0.5 MG tablet Take 1 tablet (0.5 mg total) by mouth 3 (three) times daily  as needed for anxiety. 90 tablet 2  . amLODipine (NORVASC) 5 MG tablet TAKE 1 TABLET EVERY DAY 90 tablet 3  . aspirin 81 MG tablet Take 81 mg by mouth daily.    . Fluticasone-Salmeterol (ADVAIR DISKUS) 250-50 MCG/DOSE AEPB Inhale 1 puff into the lungs 2 (two) times daily. 60 each 11  . losartan (COZAAR) 50 MG tablet TAKE 1 TABLET EVERY DAY 90 tablet 2  . omeprazole (PRILOSEC) 20 MG capsule TAKE 2 CAPSULES EVERY DAY 180 capsule 2  . sennosides-docusate sodium (SENOKOT-S) 8.6-50 MG tablet Take 1 tablet by mouth daily.    . traMADol (ULTRAM) 50 MG tablet Take 1 tablet (50 mg total) by mouth every 8 (eight) hours  as needed. 90 tablet 2   No current facility-administered medications on file prior to visit.        ROS:  All others reviewed and negative.  Objective        PE:  BP 132/86   Pulse 80   Temp 98.2 F (36.8 C) (Oral)   Ht 6\' 2"  (1.88 m)   Wt 234 lb (106.1 kg)   SpO2 95%   BMI 30.04 kg/m                 Constitutional: Pt appears in NAD               HENT: Head: NCAT.                Right Ear: External ear normal.                 Left Ear: External ear normal.                Eyes: . Pupils are equal, round, and reactive to light. Conjunctivae and EOM are normal               Nose: without d/c or deformity               Neck: Neck supple. Gross normal ROM               Cardiovascular: Normal rate and regular rhythm.                 Pulmonary/Chest: Effort normal and breath sounds without rales or wheezing.                Abd:  Soft, NT, ND, + BS, no organomegaly               Neurological: Pt is alert. At baseline orientation, motor grossly intact               Skin: Skin is warm. No rashes, no other new lesions, LE edema - none, dorsalis pedis pulse 1+ bilateral               Psychiatric: Pt behavior is normal without agitation   Micro: none  Cardiac tracings I have personally interpreted today:  none  Pertinent Radiological findings (summarize): none   Lab Results  Component Value Date   WBC 6.8 11/07/2020   HGB 16.1 11/07/2020   HCT 46.3 11/07/2020   PLT 258.0 11/07/2020   GLUCOSE 109 (H) 11/07/2020   CHOL 118 11/07/2020   TRIG 206.0 (H) 11/07/2020   HDL 33.50 (L) 11/07/2020   LDLDIRECT 62.0 11/07/2020   LDLCALC 58 05/09/2020   ALT 22 11/07/2020   AST 18 11/07/2020   NA 139 11/07/2020   K 3.9 11/07/2020  CL 102 11/07/2020   CREATININE 1.02 11/07/2020   BUN 8 11/07/2020   CO2 31 11/07/2020   TSH 0.64 11/07/2020   PSA 1.01 11/07/2020   INR 1.20 12/06/2015   HGBA1C 5.9 11/07/2020   Assessment/Plan:  DANTA BAUMGARDNER is a 70 y.o. White or Caucasian [1] male  with  has a past medical history of Allergic rhinitis, Allergy, Anxiety, Arthritis, Asthma, mild, Borderline diabetes mellitus, Depression, Diverticulosis of colon, Gallstones, Generalized anxiety disorder, GERD (gastroesophageal reflux disease), Hemorrhoids, History of colonic polyps, Hyperlipidemia, Hypertension, Lumbar back pain, Obesity, and Venous insufficiency.  Encounter for well adult exam with abnormal findings Age and sex appropriate education and counseling updated with regular exercise and diet Referrals for preventative services - for colonoscopy soon already scheduled Immunizations addressed - none needed Smoking counseling  - none needed Evidence for depression or other mood disorder - none significant Most recent labs reviewed. I have personally reviewed and have noted: 1) the patient's medical and social history 2) The patient's current medications and supplements 3) The patient's height, weight, and BMI have been recorded in the chart   B12 deficiency Lab Results  Component Value Date   VITAMINB12 276 11/07/2020   Stable, cont oral replacement - b12 1000 mcg qd    Hyperlipidemia Lab Results  Component Value Date   LDLCALC 58 05/09/2020   Stable, pt to consider statin  But declines for now   Hypertriglyceridemia For lower fat diet  Pre-diabetes Lab Results  Component Value Date   HGBA1C 5.9 11/07/2020   Stable, pt to continue current medical treatment  - diet and wt control   Constipation Ok for ot glycerin suppos's  Right leg pain Ok for vascular arterial study r/o pad  Vitamin D deficiency Last vitamin D Lab Results  Component Value Date   VD25OH 78.36 11/07/2020   Stable, cont oral replacement  Allergic rhinitis Ok to restart flonase asd  Followup: Return in about 6 months (around 05/14/2021).  Cathlean Cower, MD 11/11/2020 9:48 PM [facility]

## 2020-11-11 NOTE — Assessment & Plan Note (Signed)
[place] for vascular arterial study r/o pad

## 2020-11-11 NOTE — Assessment & Plan Note (Signed)
Age and sex appropriate education and counseling updated with regular exercise and diet Referrals for preventative services - for colonoscopy soon already scheduled Immunizations addressed - none needed Smoking counseling  - none needed Evidence for depression or other mood disorder - none significant Most recent labs reviewed. I have personally reviewed and have noted: 1) the patient's medical and social history 2) The patient's current medications and supplements 3) The patient's height, weight, and BMI have been recorded in the chart

## 2020-11-11 NOTE — Assessment & Plan Note (Signed)
Last vitamin D Lab Results  Component Value Date   VD25OH 78.36 11/07/2020   Stable, cont oral replacement

## 2020-11-19 ENCOUNTER — Other Ambulatory Visit: Payer: Self-pay | Admitting: Internal Medicine

## 2020-11-19 ENCOUNTER — Ambulatory Visit (HOSPITAL_COMMUNITY)
Admission: RE | Admit: 2020-11-19 | Discharge: 2020-11-19 | Disposition: A | Discharge status: Home / Self Care | Payer: Medicare HMO | Source: Ambulatory Visit | Attending: Cardiovascular Disease | Admitting: Cardiovascular Disease

## 2020-11-19 ENCOUNTER — Other Ambulatory Visit: Payer: Self-pay

## 2020-11-19 DIAGNOSIS — R200 Anesthesia of skin: Secondary | ICD-10-CM

## 2020-11-19 DIAGNOSIS — M79604 Pain in right leg: Secondary | ICD-10-CM

## 2020-11-19 DIAGNOSIS — R202 Paresthesia of skin: Secondary | ICD-10-CM

## 2020-11-19 DIAGNOSIS — R209 Unspecified disturbances of skin sensation: Secondary | ICD-10-CM | POA: Diagnosis not present

## 2020-11-21 ENCOUNTER — Encounter: Payer: Self-pay | Admitting: Internal Medicine

## 2020-12-09 ENCOUNTER — Other Ambulatory Visit: Payer: Self-pay

## 2020-12-09 ENCOUNTER — Ambulatory Visit (AMBULATORY_SURGERY_CENTER): Payer: Self-pay

## 2020-12-09 VITALS — Ht 74.0 in | Wt 233.0 lb

## 2020-12-09 DIAGNOSIS — Z800 Family history of malignant neoplasm of digestive organs: Secondary | ICD-10-CM

## 2020-12-09 DIAGNOSIS — Z86010 Personal history of colonic polyps: Secondary | ICD-10-CM

## 2020-12-09 MED ORDER — NA SULFATE-K SULFATE-MG SULF 17.5-3.13-1.6 GM/177ML PO SOLN: 1.0000 | Freq: Once | ORAL | 0 refills | Status: AC

## 2020-12-09 NOTE — Progress Notes (Signed)
No allergies to soy or egg Pt is not on blood thinners or diet pills Denies issues with sedation/intubation Denies atrial flutter/fib Denies constipation -- goes he says at least every other day.  Emmi instructions given to pt  Pt is aware of Covid safety and care partner requirements.   Information given to pt for arrange for care partner to be wiht

## 2020-12-11 ENCOUNTER — Encounter: Payer: Self-pay | Admitting: Gastroenterology

## 2020-12-16 DIAGNOSIS — D123 Benign neoplasm of transverse colon: Secondary | ICD-10-CM | POA: Diagnosis not present

## 2020-12-23 ENCOUNTER — Other Ambulatory Visit: Payer: Self-pay

## 2020-12-23 ENCOUNTER — Encounter: Payer: Self-pay | Admitting: Gastroenterology

## 2020-12-23 ENCOUNTER — Ambulatory Visit (AMBULATORY_SURGERY_CENTER): Payer: Medicare HMO | Admitting: Gastroenterology

## 2020-12-23 VITALS — BP 114/68 | HR 66 | Temp 97.6°F | Resp 19 | Ht 74.0 in | Wt 233.0 lb

## 2020-12-23 DIAGNOSIS — Z86010 Personal history of colonic polyps: Secondary | ICD-10-CM | POA: Diagnosis not present

## 2020-12-23 DIAGNOSIS — K219 Gastro-esophageal reflux disease without esophagitis: Secondary | ICD-10-CM | POA: Diagnosis not present

## 2020-12-23 DIAGNOSIS — J45909 Unspecified asthma, uncomplicated: Secondary | ICD-10-CM | POA: Diagnosis not present

## 2020-12-23 DIAGNOSIS — I10 Essential (primary) hypertension: Secondary | ICD-10-CM | POA: Diagnosis not present

## 2020-12-23 DIAGNOSIS — Z800 Family history of malignant neoplasm of digestive organs: Secondary | ICD-10-CM | POA: Diagnosis not present

## 2020-12-23 DIAGNOSIS — D123 Benign neoplasm of transverse colon: Secondary | ICD-10-CM | POA: Diagnosis not present

## 2020-12-23 DIAGNOSIS — K5909 Other constipation: Secondary | ICD-10-CM | POA: Diagnosis not present

## 2020-12-23 DIAGNOSIS — Z1211 Encounter for screening for malignant neoplasm of colon: Secondary | ICD-10-CM | POA: Diagnosis not present

## 2020-12-23 MED ORDER — SODIUM CHLORIDE 0.9 % IV SOLN: 500.0000 mL | Freq: Once | INTRAVENOUS | Status: DC

## 2020-12-23 NOTE — Progress Notes (Signed)
VS-CW  Pt's states no medical or surgical changes since previsit or office visit.  

## 2020-12-23 NOTE — Op Note (Signed)
[facility] Patient Name: [name] [name] Procedure Date: 12/23/2020 1:25 PM MRN: [id number] Endoscopist: Ladene Artist , MD Age: 70 Referring MD:  Date of Birth: 03-03-1951 Gender: Male Account #: [id number] Procedure:                Colonoscopy Indications:              Surveillance: Personal history of adenomatous                            polyps on last colonoscopy > 5 years ago. Family                            history of colon cancer, first degree relative. Medicines:                Monitored Anesthesia Care Procedure:                Pre-Anesthesia Assessment:                           - Prior to the procedure, a History and Physical                            was performed, and patient medications and                            allergies were reviewed. The patient's tolerance of                            previous anesthesia was also reviewed. The risks                            and benefits of the procedure and the sedation                            options and risks were discussed with the patient.                            All questions were answered, and informed consent                            was obtained. Prior Anticoagulants: The patient has                            taken no previous anticoagulant or antiplatelet                            agents. ASA Grade Assessment: III - A patient with                            severe systemic disease. After reviewing the risks                            and benefits, the patient was deemed in  satisfactory condition to undergo the procedure.                           After obtaining informed consent, the colonoscope                            was passed under direct vision. Throughout the                            procedure, the patient's blood pressure, pulse, and                            oxygen saturations were monitored continuously. The                            Olympus CF-HQ190  [id number]) Colonoscope was                            introduced through the anus and advanced to the the                            cecum, identified by appendiceal orifice and                            ileocecal valve. The ileocecal valve, appendiceal                            orifice, and rectum were photographed. The quality                            of the bowel preparation was adequate after                            extensive lavage and suction. The colonoscopy was                            performed without difficulty. The patient tolerated                            the procedure well. Scope In: 1:28:50 PM Scope Out: 1:46:27 PM Scope Withdrawal Time: 0 hours 14 minutes 32 seconds  Total Procedure Duration: 0 hours 17 minutes 37 seconds  Findings:                 The perianal and digital rectal examinations were                            normal.                           Six sessile polyps were found in the transverse                            colon (4) and hepatic flexure (2). The polyps were  6 to 8 mm in size. These polyps were removed with a                            cold snare. Resection and retrieval were complete.                           Multiple medium-mouthed diverticula were found in                            the right colon. There was no evidence of                            diverticular bleeding.                           Multiple medium-mouthed diverticula were found in                            the left colon. There was narrowing of the colon in                            association with the diverticular opening. There                            was evidence of diverticular spasm. There was                            evidence of an impacted diverticulum. There was no                            evidence of diverticular bleeding.                           Internal hemorrhoids were found during                             retroflexion. The hemorrhoids were moderate and                            Grade I (internal hemorrhoids that do not prolapse).                           The exam was otherwise without abnormality on                            direct and retroflexion views. Complications:            No immediate complications. Estimated blood loss:                            None. Estimated Blood Loss:     Estimated blood loss: none. Impression:               - Six 6 to 8 mm polyps in the transverse colon and  at the hepatic flexure, removed with a cold snare.                            Resected and retrieved.                           - Mild diverticulosis in the right colon.                           - Moderate diverticulosis in the left colon.                           - Internal hemorrhoids.                           - The examination was otherwise normal on direct                            and retroflexion views. Recommendation:           - Repeat colonoscopy, likely 3 years, after studies                            are complete for surveillance based on pathology                            results with a more extensive bowel prep.                           - Patient has a contact number available for                            emergencies. The signs and symptoms of potential                            delayed complications were discussed with the                            patient. Return to normal activities tomorrow.                            Written discharge instructions were provided to the                            patient.                           - High fiber diet.                           - Continue present medications.                           - Await pathology results. Ladene Artist, MD 12/23/2020 1:52:28 PM This report has been signed electronically.

## 2020-12-23 NOTE — Progress Notes (Signed)
Report to PACU, RN, vss, BBS= Clear.  

## 2020-12-23 NOTE — Patient Instructions (Signed)
YOU HAD AN ENDOSCOPIC PROCEDURE TODAY AT THE [facility]:   Refer to the procedure report that was given to you for any specific questions about what was found during the examination.  If the procedure report does not answer your questions, please call your gastroenterologist to clarify.  If you requested that your care partner not be given the details of your procedure findings, then the procedure report has been included in a sealed envelope for you to review at your convenience later.  YOU SHOULD EXPECT: Some feelings of bloating in the abdomen. Passage of more gas than usual.  Walking can help get rid of the air that was put into your GI tract during the procedure and reduce the bloating. If you had a lower endoscopy (such as a colonoscopy or flexible sigmoidoscopy) you may notice spotting of blood in your stool or on the toilet paper. If you underwent a bowel prep for your procedure, you may not have a normal bowel movement for a few days.  Please Note:  You might notice some irritation and congestion in your nose or some drainage.  This is from the oxygen used during your procedure.  There is no need for concern and it should clear up in a day or so.  SYMPTOMS TO REPORT IMMEDIATELY:   Following lower endoscopy (colonoscopy or flexible sigmoidoscopy):  Excessive amounts of blood in the stool  Significant tenderness or worsening of abdominal pains  Swelling of the abdomen that is new, acute  Fever of 100F or higher   Following upper endoscopy (EGD)  Vomiting of blood or coffee ground material  New chest pain or pain under the shoulder blades  Painful or persistently difficult swallowing  New shortness of breath  Fever of 100F or higher  Black, tarry-looking stools  For urgent or emergent issues, a gastroenterologist can be reached at any hour by calling [phone]. Do not use MyChart messaging for urgent concerns.    DIET:  We do recommend a small meal at first, but  then you may proceed to your regular diet.  Drink plenty of fluids but you should avoid alcoholic beverages for 24 hours.  ACTIVITY:  You should plan to take it easy for the rest of today and you should NOT DRIVE or use heavy machinery until tomorrow (because of the sedation medicines used during the test).    FOLLOW UP: Our staff will call the number listed on your records 48-72 hours following your procedure to check on you and address any questions or concerns that you may have regarding the information given to you following your procedure. If we do not reach you, we will leave a message.  We will attempt to reach you two times.  During this call, we will ask if you have developed any symptoms of COVID 19. If you develop any symptoms (ie: fever, flu-like symptoms, shortness of breath, cough etc.) before then, please call [phone].  If you test positive for Covid 19 in the 2 weeks post procedure, please call and report this information to us.    If any biopsies were taken you will be contacted by phone or by letter within the next 1-3 weeks.  Please call us at [phone] if you have not heard about the biopsies in 3 weeks.    SIGNATURES/CONFIDENTIALITY: You and/or your care partner have signed paperwork which will be entered into your electronic medical record.  These signatures attest to the fact that that the information above on   your After Visit Summary has been reviewed and is understood.  Full responsibility of the confidentiality of this discharge information lies with you and/or your care-partner. 

## 2020-12-23 NOTE — Progress Notes (Signed)
Called to room to assist during endoscopic procedure.  Patient ID and intended procedure confirmed with present staff. Received instructions for my participation in the procedure from the performing physician.  

## 2020-12-25 ENCOUNTER — Telehealth: Payer: Self-pay | Admitting: Internal Medicine

## 2020-12-25 ENCOUNTER — Telehealth: Payer: Self-pay

## 2020-12-25 MED ORDER — TRAMADOL HCL 50 MG PO TABS: 50.0000 mg | ORAL_TABLET | Freq: Three times a day (TID) | ORAL | 2 refills | Status: DC | PRN

## 2020-12-25 MED ORDER — ALPRAZOLAM 0.5 MG PO TABS: 0.5000 mg | ORAL_TABLET | Freq: Three times a day (TID) | ORAL | 2 refills | Status: DC | PRN

## 2020-12-25 NOTE — Telephone Encounter (Signed)
ALPRAZolam [name]) 0.5 MG tablet traMADol Veatrice Bourbon) 50 MG tablet [facility] [place], [place] Phone:  [phone]  Fax:  [phone]     Last seen- 03.14.22 Next apt- n/a

## 2020-12-25 NOTE — Telephone Encounter (Signed)
  Follow up Call-  Call back number 12/23/2020  Post procedure Call Back phone  # [phone]- pt is very talkative  Permission to leave phone message Yes  Some recent data might be hidden     Patient questions:  Do you have a fever, pain , or abdominal swelling? No. Pain Score  0 *  Have you tolerated food without any problems? Yes.    Have you been able to return to your normal activities? Yes.    Do you have any questions about your discharge instructions: Diet   No. Medications  No. Follow up visit  No.  Do you have questions or concerns about your Care? No.  Actions: * If pain score is 4 or above: No action needed, pain <4.  1. Have you developed a fever since your procedure? no  2.   Have you had an respiratory symptoms (SOB or cough) since your procedure? no  3.   Have you tested positive for COVID 19 since your procedure no  4.   Have you had any family members/close contacts diagnosed with the COVID 19 since your procedure?  no   If yes to any of these questions please route to Joylene John, RN and Joella Prince, RN

## 2021-01-06 ENCOUNTER — Encounter: Payer: Self-pay | Admitting: Gastroenterology

## 2021-03-25 ENCOUNTER — Other Ambulatory Visit: Payer: Self-pay | Admitting: Internal Medicine

## 2021-04-15 ENCOUNTER — Other Ambulatory Visit: Payer: Self-pay | Admitting: Internal Medicine

## 2021-04-15 NOTE — Telephone Encounter (Signed)
Please refill as per office routine med refill policy (all routine meds refilled for 3 mo or monthly per pt preference up to one year from last visit, then month to month grace period for 3 mo, then further med refills will have to be denied)  

## 2021-05-14 ENCOUNTER — Other Ambulatory Visit: Payer: Self-pay | Admitting: Internal Medicine

## 2021-05-14 ENCOUNTER — Other Ambulatory Visit (INDEPENDENT_AMBULATORY_CARE_PROVIDER_SITE_OTHER): Payer: Medicare HMO

## 2021-05-14 DIAGNOSIS — E559 Vitamin D deficiency, unspecified: Secondary | ICD-10-CM

## 2021-05-14 DIAGNOSIS — E538 Deficiency of other specified B group vitamins: Secondary | ICD-10-CM

## 2021-05-14 DIAGNOSIS — Z0000 Encounter for general adult medical examination without abnormal findings: Secondary | ICD-10-CM

## 2021-05-14 DIAGNOSIS — R7303 Prediabetes: Secondary | ICD-10-CM

## 2021-05-14 LAB — LIPID PANEL
Cholesterol: 105 mg/dL (ref 0–200)
HDL: 33.3 mg/dL — ABNORMAL LOW (ref >39.00)
LDL Cholesterol: 42 mg/dL (ref 0–99)
NonHDL: 71.49
Total CHOL/HDL Ratio: 3
Triglycerides: 147 mg/dL (ref 0.0–149.0)
VLDL: 29.4 mg/dL (ref 0.0–40.0)

## 2021-05-14 LAB — HEMOGLOBIN A1C: Hgb A1c MFr Bld: 5.7 % (ref 4.6–6.5)

## 2021-05-14 LAB — BASIC METABOLIC PANEL
BUN: 9 mg/dL (ref 6–23)
CO2: 27 mEq/L (ref 19–32)
Calcium: 8.9 mg/dL (ref 8.4–10.5)
Chloride: 102 mEq/L (ref 96–112)
Creatinine, Ser: 0.92 mg/dL (ref 0.40–1.50)
GFR: 84.82 mL/min (ref >60.00)
Glucose, Bld: 98 mg/dL (ref 70–99)
Potassium: 3.7 mEq/L (ref 3.5–5.1)
Sodium: 138 mEq/L (ref 135–145)

## 2021-05-14 LAB — HEPATIC FUNCTION PANEL
ALT: 18 U/L (ref 0–53)
AST: 16 U/L (ref 0–37)
Albumin: 3.8 g/dL (ref 3.5–5.2)
Alkaline Phosphatase: 50 U/L (ref 39–117)
Bilirubin, Direct: 0.1 mg/dL (ref 0.0–0.3)
Total Bilirubin: 0.6 mg/dL (ref 0.2–1.2)
Total Protein: 6.4 g/dL (ref 6.0–8.3)

## 2021-05-14 LAB — VITAMIN D 25 HYDROXY (VIT D DEFICIENCY, FRACTURES): VITD: 58.16 ng/mL (ref 30.00–100.00)

## 2021-05-14 LAB — VITAMIN B12: Vitamin B-12: 246 pg/mL (ref 211–911)

## 2021-05-16 ENCOUNTER — Ambulatory Visit (INDEPENDENT_AMBULATORY_CARE_PROVIDER_SITE_OTHER): Payer: Medicare HMO | Admitting: Internal Medicine

## 2021-05-16 ENCOUNTER — Other Ambulatory Visit: Payer: Self-pay

## 2021-05-16 ENCOUNTER — Encounter: Payer: Self-pay | Admitting: Internal Medicine

## 2021-05-16 VITALS — BP 132/70 | HR 61 | Ht 74.0 in | Wt 228.0 lb

## 2021-05-16 DIAGNOSIS — E559 Vitamin D deficiency, unspecified: Secondary | ICD-10-CM

## 2021-05-16 DIAGNOSIS — E7800 Pure hypercholesterolemia, unspecified: Secondary | ICD-10-CM | POA: Diagnosis not present

## 2021-05-16 DIAGNOSIS — E538 Deficiency of other specified B group vitamins: Secondary | ICD-10-CM

## 2021-05-16 DIAGNOSIS — Z23 Encounter for immunization: Secondary | ICD-10-CM

## 2021-05-16 DIAGNOSIS — I10 Essential (primary) hypertension: Secondary | ICD-10-CM

## 2021-05-16 DIAGNOSIS — R7303 Prediabetes: Secondary | ICD-10-CM | POA: Diagnosis not present

## 2021-05-16 DIAGNOSIS — Z0000 Encounter for general adult medical examination without abnormal findings: Secondary | ICD-10-CM

## 2021-05-16 NOTE — Patient Instructions (Addendum)
You had the flu shot today  Ok to start OTC B12 1000 mcg per day  Please continue all other medications as before, and refills have been done if requested.  Please have the pharmacy call with any other refills you may need.  Please continue your efforts at being more active, low cholesterol diet, and weight control..  Please keep your appointments with your specialists as you may have planned  Please make an Appointment to return in 6 months, or sooner if needed, also with Lab Appointment for testing done 3-5 days before at the [facility] (so this is for TWO appointments - please see the scheduling desk as you leave)  Due to the ongoing Covid 19 pandemic, our lab now requires an appointment for any labs done at our office.  If you need labs done and do not have an appointment, please call our office ahead of time to schedule before presenting to the lab for your testing.

## 2021-05-16 NOTE — Progress Notes (Signed)
Patient ID: [name] [name], male   DOB: [birth year], 70 y.o.   MRN: [id number]        Chief Complaint: follow up HTN, HLD and hyperglycemia , low b12       HPI:  [name] [name] is a 70 y.o. male here overall doing ok.  Pt denies chest pain, increased sob or doe, wheezing, orthopnea, PND, increased LE swelling, palpitations, dizziness or syncope.   Pt denies polydipsia, polyuria, or new focal neuro s/s.    Had tooth pulled 4 days ago but pain and swelling improving.  Not taking b12.  Trying to follow lower chol diet.  No other new complaints.   Wt Readings from Last 3 Encounters:  05/16/21 228 lb (103.4 kg)  12/23/20 233 lb (105.7 kg)  12/09/20 233 lb (105.7 kg)   BP Readings from Last 3 Encounters:  05/16/21 132/70  12/23/20 114/68  11/11/20 132/86         Past Medical History:  Diagnosis Date   Allergic rhinitis    Allergy    SEASONAL,USE TO TAKE ALLERGY SHOTS   Anxiety    Arthritis    BACK,KNEES   Asthma, mild    Borderline diabetes mellitus    Depression    Diverticulosis of colon    Gallstones    Generalized anxiety disorder    GERD (gastroesophageal reflux disease)    Hemorrhoids    History of colonic polyps    Hyperlipidemia    Hypertension    Lumbar back pain    Obesity    Venous insufficiency    Past Surgical History:  Procedure Laterality Date   COLONOSCOPY  2011   LUMBAR FUSION     LUMBAR LAMINECTOMY  [id number]   POLYPECTOMY      reports that he has never smoked. He has never used smokeless tobacco. He reports that he does not drink alcohol and does not use drugs. family history includes Colon cancer in his father and maternal grandfather; Diabetes in his father; Heart attack in his father; Lung cancer in his mother; Prostate cancer in his father; Stroke in his father. No Known Allergies Current Outpatient Medications on File Prior to Visit  Medication Sig Dispense Refill   albuterol (PROVENTIL HFA;VENTOLIN HFA) 108 (90 Base) MCG/ACT inhaler Inhale 2 puffs  into the lungs every 6 (six) hours as needed for wheezing or shortness of breath. 1 Inhaler 5   ALPRAZolam (XANAX) 0.5 MG tablet TAKE 1 TABLET BY MOUTH THREE TIMES DAILY AS NEEDED FOR ANXIETY 90 tablet 2   amLODipine (NORVASC) 5 MG tablet TAKE 1 TABLET EVERY DAY 90 tablet 3   aspirin 81 MG tablet Take 81 mg by mouth daily.     fluticasone (FLONASE) 50 MCG/ACT nasal spray Use 2 spray(s) in each nostril once daily 48 g 5   Fluticasone-Salmeterol (ADVAIR DISKUS) 250-50 MCG/DOSE AEPB Inhale 1 puff into the lungs 2 (two) times daily. 60 each 11   losartan (COZAAR) 50 MG tablet TAKE 1 TABLET EVERY DAY 90 tablet 2   omeprazole (PRILOSEC) 20 MG capsule TAKE 2 CAPSULES EVERY DAY 180 capsule 2   Probiotic Product (PROBIOTIC PO) Take by mouth.     sennosides-docusate sodium (SENOKOT-S) 8.6-50 MG tablet Take 1 tablet by mouth daily.     traMADol (ULTRAM) 50 MG tablet TAKE 1 TABLET BY MOUTH EVERY 8 HOURS AS NEEDED 90 tablet 2   VITAMIN B COMPLEX-C PO Take by mouth.     VITAMIN D PO Take by mouth.  No current facility-administered medications on file prior to visit.        ROS:  All others reviewed and negative.  Objective        PE:  BP 132/70 (BP Location: Right Arm, Patient Position: Sitting, Cuff Size: Normal)   Pulse 61   Ht '6\' 2"'$  (1.88 m)   Wt 228 lb (103.4 kg)   SpO2 96%   BMI 29.27 kg/m                 Constitutional: Pt appears in NAD               HENT: Head: NCAT.                Right Ear: External ear normal.                 Left Ear: External ear normal.                Eyes: . Pupils are equal, round, and reactive to light. Conjunctivae and EOM are normal               Nose: without d/c or deformity               Neck: Neck supple. Gross normal ROM               Cardiovascular: Normal rate and regular rhythm.                 Pulmonary/Chest: Effort normal and breath sounds without rales or wheezing.                Abd:  Soft, NT, ND, + BS, no organomegaly                Neurological: Pt is alert. At baseline orientation, motor grossly intact               Skin: Skin is warm. No rashes, no other new lesions, LE edema - none               Psychiatric: Pt behavior is normal without agitation   Micro: none  Cardiac tracings I have personally interpreted today:  none  Pertinent Radiological findings (summarize): none   Lab Results  Component Value Date   WBC 6.8 11/07/2020   HGB 16.1 11/07/2020   HCT 46.3 11/07/2020   PLT 258.0 11/07/2020   GLUCOSE 98 05/14/2021   CHOL 105 05/14/2021   TRIG 147.0 05/14/2021   HDL 33.30 (L) 05/14/2021   LDLDIRECT 62.0 11/07/2020   LDLCALC 42 05/14/2021   ALT 18 05/14/2021   AST 16 05/14/2021   NA 138 05/14/2021   K 3.7 05/14/2021   CL 102 05/14/2021   CREATININE 0.92 05/14/2021   BUN 9 05/14/2021   CO2 27 05/14/2021   TSH 0.64 11/07/2020   PSA 1.01 11/07/2020   INR 1.20 12/06/2015   HGBA1C 5.7 05/14/2021   Lab Results  Component Value Date   VITAMINB12 246 05/14/2021    Assessment/Plan:  [name] [name] is a 70 y.o. White or Caucasian [1] male with  has a past medical history of Allergic rhinitis, Allergy, Anxiety, Arthritis, Asthma, mild, Borderline diabetes mellitus, Depression, Diverticulosis of colon, Gallstones, Generalized anxiety disorder, GERD (gastroesophageal reflux disease), Hemorrhoids, History of colonic polyps, Hyperlipidemia, Hypertension, Lumbar back pain, Obesity, and Venous insufficiency.  B12 deficiency Lab Results  Component Value Date   VITAMINB12 246 05/14/2021   Borderline low, To start oral replacement -  b12 1000 mcg qd    Vitamin D deficiency Last vitamin D Lab Results  Component Value Date   VD25OH 58.16 05/14/2021   Stable, cont oral replacement   Pre-diabetes Lab Results  Component Value Date   HGBA1C 5.7 05/14/2021   Stable, pt to continue current medical treatment  - diet   Hyperlipidemia Lab Results  Component Value Date   LDLCALC 42 05/14/2021    Stable, pt to continue current low chol diet, does not need statin   Essential hypertension BP Readings from Last 3 Encounters:  05/16/21 132/70  12/23/20 114/68  11/11/20 132/86   Stable, pt to continue medical treatment losartan, amlodipine  Followup: Return in about 6 months (around 11/13/2021).  Cathlean Cower, MD 05/24/2021 12:53 PM [facility]

## 2021-05-16 NOTE — Assessment & Plan Note (Addendum)
Lab Results  Component Value Date   VITAMINB12 246 05/14/2021   Borderline low, To start oral replacement - b12 1000 mcg qd

## 2021-05-24 ENCOUNTER — Encounter: Payer: Self-pay | Admitting: Internal Medicine

## 2021-05-24 NOTE — Assessment & Plan Note (Signed)
Lab Results  Component Value Date   HGBA1C 5.7 05/14/2021   Stable, pt to continue current medical treatment  - diet

## 2021-05-24 NOTE — Assessment & Plan Note (Signed)
Lab Results  Component Value Date   LDLCALC 42 05/14/2021   Stable, pt to continue current low chol diet, does not need statin

## 2021-05-24 NOTE — Assessment & Plan Note (Signed)
BP Readings from Last 3 Encounters:  05/16/21 132/70  12/23/20 114/68  11/11/20 132/86   Stable, pt to continue medical treatment losartan, amlodipine

## 2021-05-24 NOTE — Assessment & Plan Note (Signed)
Last vitamin D Lab Results  Component Value Date   VD25OH 58.16 05/14/2021   Stable, cont oral replacement

## 2021-06-05 DIAGNOSIS — D2261 Melanocytic nevi of right upper limb, including shoulder: Secondary | ICD-10-CM | POA: Diagnosis not present

## 2021-06-05 DIAGNOSIS — L720 Epidermal cyst: Secondary | ICD-10-CM | POA: Diagnosis not present

## 2021-06-05 DIAGNOSIS — L820 Inflamed seborrheic keratosis: Secondary | ICD-10-CM | POA: Diagnosis not present

## 2021-06-05 DIAGNOSIS — D1801 Hemangioma of skin and subcutaneous tissue: Secondary | ICD-10-CM | POA: Diagnosis not present

## 2021-06-05 DIAGNOSIS — D225 Melanocytic nevi of trunk: Secondary | ICD-10-CM | POA: Diagnosis not present

## 2021-06-05 DIAGNOSIS — L738 Other specified follicular disorders: Secondary | ICD-10-CM | POA: Diagnosis not present

## 2021-06-05 DIAGNOSIS — L821 Other seborrheic keratosis: Secondary | ICD-10-CM | POA: Diagnosis not present

## 2021-06-23 ENCOUNTER — Telehealth: Payer: Self-pay

## 2021-06-23 MED ORDER — ALPRAZOLAM 0.5 MG PO TABS: 0.5000 mg | ORAL_TABLET | Freq: Three times a day (TID) | ORAL | 2 refills | Status: DC | PRN

## 2021-06-23 MED ORDER — TRAMADOL HCL 50 MG PO TABS: 50.0000 mg | ORAL_TABLET | Freq: Three times a day (TID) | ORAL | 2 refills | Status: DC | PRN

## 2021-06-23 NOTE — Telephone Encounter (Signed)
1.Medication Requested: Alprazolam & Tramadol  2. Pharmacy (Name, Street, [place]): [facility] [address]  3. On Med List: yes  4. Last Visit with PCP: 05/2021    Agent: Please be advised that RX refills may take up to 3 business days. We ask that you follow-up with your pharmacy.

## 2021-06-23 NOTE — Telephone Encounter (Signed)
Done erx 

## 2021-07-01 ENCOUNTER — Other Ambulatory Visit: Payer: Self-pay | Admitting: Internal Medicine

## 2021-07-01 NOTE — Telephone Encounter (Signed)
Please refill as per office routine med refill policy (all routine meds to be refilled for 3 mo or monthly (per pt preference) up to one year from last visit, then month to month grace period for 3 mo, then further med refills will have to be denied) ? ?

## 2021-08-01 ENCOUNTER — Ambulatory Visit: Payer: Medicare HMO

## 2021-09-03 DIAGNOSIS — C44311 Basal cell carcinoma of skin of nose: Secondary | ICD-10-CM | POA: Diagnosis not present

## 2021-09-03 DIAGNOSIS — L309 Dermatitis, unspecified: Secondary | ICD-10-CM | POA: Diagnosis not present

## 2021-09-03 DIAGNOSIS — L820 Inflamed seborrheic keratosis: Secondary | ICD-10-CM | POA: Diagnosis not present

## 2021-09-03 DIAGNOSIS — L570 Actinic keratosis: Secondary | ICD-10-CM | POA: Diagnosis not present

## 2021-09-03 DIAGNOSIS — D485 Neoplasm of uncertain behavior of skin: Secondary | ICD-10-CM | POA: Diagnosis not present

## 2021-09-03 DIAGNOSIS — L821 Other seborrheic keratosis: Secondary | ICD-10-CM | POA: Diagnosis not present

## 2021-09-19 ENCOUNTER — Telehealth: Payer: Self-pay | Admitting: Internal Medicine

## 2021-09-19 MED ORDER — ALPRAZOLAM 0.5 MG PO TABS: 0.5000 mg | ORAL_TABLET | Freq: Three times a day (TID) | ORAL | 2 refills | Status: DC | PRN

## 2021-09-19 NOTE — Telephone Encounter (Signed)
1.Medication Requested: ALPRAZolam (XANAX) 0.5 MG tablet traMADol (ULTRAM) 50 MG tablet   2. Pharmacy (Name, Street, [place]): [facility], [place]  Phone:  [phone] Fax:  [phone]   3. On Med List: yes  4. Last Visit with PCP: 09.16.22  5. Next visit date with PCP: n/a  **Patient is completely out of medication**   Agent: Please be advised that RX refills may take up to 3 business days. We ask that you follow-up with your pharmacy.

## 2021-09-24 MED ORDER — TRAMADOL HCL 50 MG PO TABS: 50.0000 mg | ORAL_TABLET | Freq: Three times a day (TID) | ORAL | 2 refills | Status: DC | PRN

## 2021-09-24 NOTE — Addendum Note (Signed)
Addended by: Biagio Borg on: 09/24/2021 09:09 PM   Modules accepted: Orders

## 2021-09-30 ENCOUNTER — Other Ambulatory Visit: Payer: Self-pay

## 2021-09-30 ENCOUNTER — Encounter: Payer: Self-pay | Admitting: Internal Medicine

## 2021-09-30 ENCOUNTER — Ambulatory Visit (INDEPENDENT_AMBULATORY_CARE_PROVIDER_SITE_OTHER): Payer: PPO | Admitting: Internal Medicine

## 2021-09-30 DIAGNOSIS — J4521 Mild intermittent asthma with (acute) exacerbation: Secondary | ICD-10-CM

## 2021-09-30 MED ORDER — ALBUTEROL SULFATE HFA 108 (90 BASE) MCG/ACT IN AERS: 2.0000 | INHALATION_SPRAY | Freq: Four times a day (QID) | RESPIRATORY_TRACT | 0 refills | Status: DC | PRN

## 2021-09-30 MED ORDER — METHYLPREDNISOLONE ACETATE 40 MG/ML IJ SUSP
40.0000 mg | Freq: Once | INTRAMUSCULAR | Status: AC
  Administered 2021-09-30: 40 mg via INTRAMUSCULAR

## 2021-09-30 NOTE — Patient Instructions (Addendum)
This is likely a viral illness.   This will go away in the next 1-2 weeks but the cough may linger slightly.  Keep taking the flonase (nose spray) daily 2 sprays in each nostril.   We have given you the steroid shot today which should help.

## 2021-09-30 NOTE — Progress Notes (Signed)
° °  Subjective:   Patient ID: [name] [name], male    DOB: 09-Jul-1951, 71 y.o.   MRN: [id number]  HPI The patient is a 71 YO man coming in for cough and sinus problems. Not taking advair or albuterol in several years.   Review of Systems  Constitutional:  Positive for activity change.  HENT:  Positive for congestion, postnasal drip and sinus pressure.   Eyes: Negative.   Respiratory:  Positive for cough. Negative for chest tightness and shortness of breath.   Cardiovascular:  Negative for chest pain, palpitations and leg swelling.  Gastrointestinal:  Negative for abdominal distention, abdominal pain, constipation, diarrhea, nausea and vomiting.  Musculoskeletal: Negative.   Skin: Negative.   Neurological: Negative.   Psychiatric/Behavioral: Negative.     Objective:  Physical Exam Constitutional:      Appearance: He is well-developed.  HENT:     Head: Normocephalic and atraumatic.     Comments: Oropharynx with redness and clear drainage, nose with swollen turbinates, TMs normal bilaterally.  Neck:     Thyroid: No thyromegaly.  Cardiovascular:     Rate and Rhythm: Normal rate and regular rhythm.  Pulmonary:     Effort: Pulmonary effort is normal. No respiratory distress.     Breath sounds: Normal breath sounds. No wheezing or rales.  Abdominal:     General: Bowel sounds are normal. There is no distension.     Palpations: Abdomen is soft.     Tenderness: There is no abdominal tenderness. There is no rebound.  Musculoskeletal:        General: No tenderness.     Cervical back: Normal range of motion.  Lymphadenopathy:     Cervical: No cervical adenopathy.  Skin:    General: Skin is warm and dry.  Neurological:     Mental Status: He is alert and oriented to person, place, and time.     Coordination: Coordination normal.    Vitals:   09/30/21 0916  BP: 130/80  Pulse: 70  Resp: 18  Temp: 97.8 F (36.6 C)  TempSrc: Oral  SpO2: 98%  Weight: 234 lb 9.6 oz (106.4 kg)   Height: 6\' 2"  (1.88 m)    This visit occurred during the SARS-CoV-2 public health emergency.  Safety protocols were in place, including screening questions prior to the visit, additional usage of staff PPE, and extensive cleaning of exam room while observing appropriate contact time as indicated for disinfecting solutions.   Assessment & Plan:  Depo-medrol 40 mg IM given at visit

## 2021-09-30 NOTE — Assessment & Plan Note (Addendum)
Possible early flare given depo-medrol 40 mg IM. Likely viral cause so antibiotic is not indicated. Refill albuterol as he does not have one on hand at home. Continue flonase daily.

## 2021-11-10 ENCOUNTER — Other Ambulatory Visit (INDEPENDENT_AMBULATORY_CARE_PROVIDER_SITE_OTHER): Payer: PPO

## 2021-11-10 DIAGNOSIS — Z125 Encounter for screening for malignant neoplasm of prostate: Secondary | ICD-10-CM

## 2021-11-10 DIAGNOSIS — Z0000 Encounter for general adult medical examination without abnormal findings: Secondary | ICD-10-CM

## 2021-11-10 DIAGNOSIS — E7800 Pure hypercholesterolemia, unspecified: Secondary | ICD-10-CM

## 2021-11-10 DIAGNOSIS — E538 Deficiency of other specified B group vitamins: Secondary | ICD-10-CM | POA: Diagnosis not present

## 2021-11-10 DIAGNOSIS — R7303 Prediabetes: Secondary | ICD-10-CM

## 2021-11-10 DIAGNOSIS — E559 Vitamin D deficiency, unspecified: Secondary | ICD-10-CM

## 2021-11-10 LAB — HEPATIC FUNCTION PANEL
ALT: 21 U/L (ref 0–53)
AST: 20 U/L (ref 0–37)
Albumin: 4.3 g/dL (ref 3.5–5.2)
Alkaline Phosphatase: 57 U/L (ref 39–117)
Bilirubin, Direct: 0.1 mg/dL (ref 0.0–0.3)
Total Bilirubin: 0.7 mg/dL (ref 0.2–1.2)
Total Protein: 6.4 g/dL (ref 6.0–8.3)

## 2021-11-10 LAB — CBC WITH DIFFERENTIAL/PLATELET
Basophils Absolute: 0.1 10*3/uL (ref 0.0–0.1)
Basophils Relative: 0.8 % (ref 0.0–3.0)
Eosinophils Absolute: 0.3 10*3/uL (ref 0.0–0.7)
Eosinophils Relative: 3.2 % (ref 0.0–5.0)
HCT: 45.1 % (ref 39.0–52.0)
Hemoglobin: 16 g/dL (ref 13.0–17.0)
Lymphocytes Relative: 22.2 % (ref 12.0–46.0)
Lymphs Abs: 1.9 10*3/uL (ref 0.7–4.0)
MCHC: 35.4 g/dL (ref 30.0–36.0)
MCV: 90.8 fl (ref 78.0–100.0)
Monocytes Absolute: 0.5 10*3/uL (ref 0.1–1.0)
Monocytes Relative: 6.4 % (ref 3.0–12.0)
Neutro Abs: 5.7 10*3/uL (ref 1.4–7.7)
Neutrophils Relative %: 67.4 % (ref 43.0–77.0)
Platelets: 288 10*3/uL (ref 150.0–400.0)
RBC: 4.97 Mil/uL (ref 4.22–5.81)
RDW: 13.6 % (ref 11.5–15.5)
WBC: 8.4 10*3/uL (ref 4.0–10.5)

## 2021-11-10 LAB — URINALYSIS, ROUTINE W REFLEX MICROSCOPIC
Bilirubin Urine: NEGATIVE
Hgb urine dipstick: NEGATIVE
Ketones, ur: NEGATIVE
Leukocytes,Ua: NEGATIVE
Nitrite: NEGATIVE
RBC / HPF: NONE SEEN (ref 0–?)
Specific Gravity, Urine: 1.025 (ref 1.000–1.030)
Total Protein, Urine: NEGATIVE
Urine Glucose: NEGATIVE
Urobilinogen, UA: 0.2 (ref 0.0–1.0)
pH: 6 (ref 5.0–8.0)

## 2021-11-10 LAB — LIPID PANEL
Cholesterol: 115 mg/dL (ref 0–200)
HDL: 35.3 mg/dL — ABNORMAL LOW (ref >39.00)
NonHDL: 80.17
Total CHOL/HDL Ratio: 3
Triglycerides: 267 mg/dL — ABNORMAL HIGH (ref 0.0–149.0)
VLDL: 53.4 mg/dL — ABNORMAL HIGH (ref 0.0–40.0)

## 2021-11-10 LAB — TSH: TSH: 0.27 u[IU]/mL — ABNORMAL LOW (ref 0.35–5.50)

## 2021-11-10 LAB — BASIC METABOLIC PANEL
BUN: 6 mg/dL (ref 6–23)
CO2: 29 mEq/L (ref 19–32)
Calcium: 9.2 mg/dL (ref 8.4–10.5)
Chloride: 102 mEq/L (ref 96–112)
Creatinine, Ser: 0.96 mg/dL (ref 0.40–1.50)
GFR: 80.32 mL/min (ref >60.00)
Glucose, Bld: 129 mg/dL — ABNORMAL HIGH (ref 70–99)
Potassium: 3.4 mEq/L — ABNORMAL LOW (ref 3.5–5.1)
Sodium: 140 mEq/L (ref 135–145)

## 2021-11-10 LAB — LDL CHOLESTEROL, DIRECT: Direct LDL: 50 mg/dL

## 2021-11-10 LAB — HEMOGLOBIN A1C: Hgb A1c MFr Bld: 5.9 % (ref 4.6–6.5)

## 2021-11-10 LAB — VITAMIN B12: Vitamin B-12: 1431 pg/mL — ABNORMAL HIGH (ref 211–911)

## 2021-11-10 LAB — VITAMIN D 25 HYDROXY (VIT D DEFICIENCY, FRACTURES): VITD: 55.33 ng/mL (ref 30.00–100.00)

## 2021-11-10 LAB — PSA: PSA: 1.28 ng/mL (ref 0.10–4.00)

## 2021-11-13 ENCOUNTER — Encounter: Payer: Self-pay | Admitting: Internal Medicine

## 2021-11-13 ENCOUNTER — Other Ambulatory Visit: Payer: Self-pay

## 2021-11-13 ENCOUNTER — Ambulatory Visit (INDEPENDENT_AMBULATORY_CARE_PROVIDER_SITE_OTHER): Payer: PPO | Admitting: Internal Medicine

## 2021-11-13 VITALS — BP 128/72 | HR 72 | Ht 74.0 in | Wt 233.0 lb

## 2021-11-13 DIAGNOSIS — Z0001 Encounter for general adult medical examination with abnormal findings: Secondary | ICD-10-CM | POA: Diagnosis not present

## 2021-11-13 DIAGNOSIS — G8929 Other chronic pain: Secondary | ICD-10-CM | POA: Diagnosis not present

## 2021-11-13 DIAGNOSIS — M5441 Lumbago with sciatica, right side: Secondary | ICD-10-CM | POA: Diagnosis not present

## 2021-11-13 DIAGNOSIS — J309 Allergic rhinitis, unspecified: Secondary | ICD-10-CM

## 2021-11-13 DIAGNOSIS — E538 Deficiency of other specified B group vitamins: Secondary | ICD-10-CM

## 2021-11-13 DIAGNOSIS — I10 Essential (primary) hypertension: Secondary | ICD-10-CM

## 2021-11-13 DIAGNOSIS — E7800 Pure hypercholesterolemia, unspecified: Secondary | ICD-10-CM

## 2021-11-13 DIAGNOSIS — R7989 Other specified abnormal findings of blood chemistry: Secondary | ICD-10-CM | POA: Diagnosis not present

## 2021-11-13 DIAGNOSIS — E559 Vitamin D deficiency, unspecified: Secondary | ICD-10-CM | POA: Diagnosis not present

## 2021-11-13 DIAGNOSIS — R7303 Prediabetes: Secondary | ICD-10-CM | POA: Diagnosis not present

## 2021-11-13 MED ORDER — ALPRAZOLAM 0.5 MG PO TABS: 0.5000 mg | ORAL_TABLET | Freq: Three times a day (TID) | ORAL | 2 refills | Status: DC | PRN

## 2021-11-13 MED ORDER — OMEPRAZOLE 20 MG PO CPDR: 40.0000 mg | DELAYED_RELEASE_CAPSULE | Freq: Every day | ORAL | 3 refills | Status: DC

## 2021-11-13 MED ORDER — FLUTICASONE-SALMETEROL 250-50 MCG/ACT IN AEPB: 1.0000 | INHALATION_SPRAY | Freq: Two times a day (BID) | RESPIRATORY_TRACT | 3 refills | Status: AC

## 2021-11-13 MED ORDER — LOSARTAN POTASSIUM 50 MG PO TABS: 50.0000 mg | ORAL_TABLET | Freq: Every day | ORAL | 3 refills | Status: DC

## 2021-11-13 MED ORDER — AMLODIPINE BESYLATE 5 MG PO TABS: 5.0000 mg | ORAL_TABLET | Freq: Every day | ORAL | 3 refills | Status: DC

## 2021-11-13 NOTE — Patient Instructions (Signed)
Please plan to go to the [facility] in about 4 wks (no appointment needed) to recheck the thyroid testing; if it still appears you may have overactive thyroid, you may need referral to Endocrinology ? ?Please continue all other medications as before, and refills have been done if requested. ? ?Please have the pharmacy call with any other refills you may need. ? ?Please continue your efforts at being more active, low cholesterol diet, and weight control. ? ?You are otherwise up to date with prevention measures today. ? ?Please keep your appointments with your specialists as you may have planned ? ?Please make an Appointment to return in 6 months, or sooner if needed ?

## 2021-11-13 NOTE — Progress Notes (Addendum)
Patient ID: [name] [name], male   DOB: 10-14-1950, 71 y.o.   MRN: [id number] ? ? ? ?     Chief Complaint:: wellness exam and Follow-up (Pt needs refills sent to [facility] on [address], discuss multiple concerns) ? , low b12 and D vitamins, chronlc lbp, abnormal TSH , sore throat ? ?     HPI:  [name] [name] is a 71 y.o. male here for wellness exam; declines shingirx, o/w up to date ?         ?              Also now taking B12 and D vitamins well.  Denies hyper or hypo thyroid symptoms such as voice, skin or hair change.  Pt continues to have recurring LBP without change in severity, bowel or bladder change, fever, wt loss,  worsening LE pain/numbness/weakness, gait change or falls.  Has 3-4 wks worsening ST without fever but Does have several wks ongoing nasal allergy symptoms with clearish congestion, itch and sneezing, without fever, pain, cough, swelling or wheezing.  Pt denies chest pain, increased sob or doe, wheezing, orthopnea, PND, increased LE swelling, palpitations, dizziness or syncope.   Pt denies polydipsia, polyuria, or new focal neuro s/s.   ? .. ?Wt Readings from Last 3 Encounters:  ?11/13/21 233 lb (105.7 kg)  ?09/30/21 234 lb 9.6 oz (106.4 kg)  ?05/16/21 228 lb (103.4 kg)  ? ?BP Readings from Last 3 Encounters:  ?11/13/21 128/72  ?09/30/21 130/80  ?05/16/21 132/70  ? ?Immunization History  ?Administered Date(s) Administered  ? Fluad Quad(high Dose 65+) 05/12/2019, 05/14/2020, 05/16/2021  ? Influenza Split 05/18/2011, 05/30/2012, 07/11/2012  ? Influenza Whole 06/06/2008, 05/29/2009, 06/02/2010  ? Influenza, High Dose Seasonal PF 05/31/2017, 06/01/2018  ? Influenza,inj,Quad PF,6+ Mos 05/24/2013, 05/31/2014, 06/07/2015, 05/19/2016  ? Influenza,inj,quad, With Preservative 05/31/2017  ? PFIZER(Purple Top)SARS-COV-2 Vaccination 11/20/2019, 12/11/2019, 06/15/2020, 12/09/2020  ? Ambulance person Booster 5y-11y 05/27/2021  ? Pneumococcal Conjugate-13 01/06/2017  ? Pneumococcal Polysaccharide-23  06/01/2018  ? Pneumococcal-Unspecified 08/31/2016  ? Tdap 07/11/2012  ? Zoster, Live 01/07/2012  ? ?There are no preventive care reminders to display for this patient. ? ?  ? ?Past Medical History:  ?Diagnosis Date  ? Allergic rhinitis   ? Allergy   ? SEASONAL,USE TO TAKE ALLERGY SHOTS  ? Anxiety   ? Arthritis   ? BACK,KNEES  ? Asthma, mild   ? Borderline diabetes mellitus   ? Depression   ? Diverticulosis of colon   ? Gallstones   ? Generalized anxiety disorder   ? GERD (gastroesophageal reflux disease)   ? Hemorrhoids   ? History of colonic polyps   ? Hyperlipidemia   ? Hypertension   ? Lumbar back pain   ? Obesity   ? Venous insufficiency   ? ?Past Surgical History:  ?Procedure Laterality Date  ? COLONOSCOPY  2011  ? LUMBAR FUSION    ? LUMBAR LAMINECTOMY  [id number]  ? POLYPECTOMY    ? ? reports that he has never smoked. He has never used smokeless tobacco. He reports that he does not drink alcohol and does not use drugs. ?family history includes Colon cancer in his father and maternal grandfather; Diabetes in his father; Heart attack in his father; Lung cancer in his mother; Prostate cancer in his father; Stroke in his father. ?No Known Allergies ?Current Outpatient Medications on File Prior to Visit  ?Medication Sig Dispense Refill  ? albuterol (VENTOLIN HFA) 108 (90 Base) MCG/ACT inhaler Inhale 2 puffs  into the lungs every 6 (six) hours as needed for wheezing or shortness of breath. 1 each 0  ? aspirin 81 MG tablet Take 81 mg by mouth daily.    ? fluticasone (FLONASE) 50 MCG/ACT nasal spray Use 2 spray(s) in each nostril once daily 48 g 5  ? Probiotic Product (PROBIOTIC PO) Take by mouth.    ? sennosides-docusate sodium (SENOKOT-S) 8.6-50 MG tablet Take 1 tablet by mouth daily.    ? VITAMIN B COMPLEX-C PO Take by mouth.    ? VITAMIN D PO Take by mouth.    ? ?No current facility-administered medications on file prior to visit.  ? ?     ROS:  All others reviewed and negative. ? ?Objective  ? ?     PE:  BP 128/72    Pulse 72   Ht '6\' 2"'$  (1.88 m)   Wt 233 lb (105.7 kg)   SpO2 98%   BMI 29.92 kg/m?  ? ?              Constitutional: Pt appears in NAD ?              HENT: Head: NCAT.  ?              Right Ear: External ear normal.   ?              Left Ear: External ear normal. Pharyn with minor post nasal gtt and cobblestoning ?              Eyes: . Pupils are equal, round, and reactive to light. Conjunctivae and EOM are normal ?              Nose: without d/c or deformity ?              Neck: Neck supple. Gross normal ROM ?              Cardiovascular: Normal rate and regular rhythm.   ?              Pulmonary/Chest: Effort normal and breath sounds without rales or wheezing.  ?              Abd:  Soft, NT, ND, + BS, no organomegaly ?              Neurological: Pt is alert. At baseline orientation, motor grossly intact ?              Skin: Skin is warm. No rashes, no other new lesions, LE edema - none ?              Psychiatric: Pt behavior is normal without agitation , chronic 2+ anxiety and pressure speech today ? ?Micro: none ? ?Cardiac tracings I have personally interpreted today:  none ? ?Pertinent Radiological findings (summarize): none  ? ?Lab Results  ?Component Value Date  ? WBC 8.4 11/10/2021  ? HGB 16.0 11/10/2021  ? HCT 45.1 11/10/2021  ? PLT 288.0 11/10/2021  ? GLUCOSE 129 (H) 11/10/2021  ? CHOL 115 11/10/2021  ? TRIG 267.0 (H) 11/10/2021  ? HDL 35.30 (L) 11/10/2021  ? LDLDIRECT 50.0 11/10/2021  ? [place] 42 05/14/2021  ? ALT 21 11/10/2021  ? AST 20 11/10/2021  ? NA 140 11/10/2021  ? K 3.4 (L) 11/10/2021  ? CL 102 11/10/2021  ? CREATININE 0.96 11/10/2021  ? BUN 6 11/10/2021  ? CO2 29 11/10/2021  ? TSH 0.27 (L) 11/10/2021  ?  PSA 1.28 11/10/2021  ? INR 1.20 12/06/2015  ? HGBA1C 5.9 11/10/2021  ? ?Assessment/Plan:  ?[name] [name] is a 71 y.o. White or Caucasian [1] male with  has a past medical history of Allergic rhinitis, Allergy, Anxiety, Arthritis, Asthma, mild, Borderline diabetes mellitus, Depression,  Diverticulosis of colon, Gallstones, Generalized anxiety disorder, GERD (gastroesophageal reflux disease), Hemorrhoids, History of colonic polyps, Hyperlipidemia, Hypertension, Lumbar back pain, Obesity, and Venous insufficiency. ? ?Vitamin D deficiency ?Last vitamin D ?Lab Results  ?Component Value Date  ? VD25OH 55.33 11/10/2021  ? ?Stable, cont oral replacement ? ? ?B12 deficiency ?Lab Results  ?Component Value Date  ? WNIOEVOJ50 1,431 (H) 11/10/2021  ? ?overcontrolled, to decrease oral replacement - b12 1000 mcg to twice per wk ? ?Abnormal TSH ?Low TSH uncontrolled, With some suspicion for hyperthyroid - will recheck in 4 wks, and consider endo referral ? ?Encounter for well adult exam with abnormal findings ?Age and sex appropriate education and counseling updated with regular exercise and diet ?Referrals for preventative services - none needed ?Immunizations addressed - declines shingrix ?Smoking counseling  - none needed ?Evidence for depression or other mood disorder - none significant ?Most recent labs reviewed. ?I have personally reviewed and have noted: ?1) the patient's medical and social history ?2) The patient's current medications and supplements ?3) The patient's height, weight, and BMI have been recorded in the chart ? ? ?Chronic lower back pain ?Overall stable, to continue current med tx - tramadol prn ? ?Pre-diabetes ?Lab Results  ?Component Value Date  ? HGBA1C 5.9 11/10/2021  ? ?Stable, pt to continue current medical treatment  - diet ? ? ?Hyperlipidemia ?Lab Results  ?Component Value Date  ? [place] 42 05/14/2021  ? ?Stable, pt to continue current low chol diet, for f/u lipid panel with labs ? ? ?Essential hypertension ?BP Readings from Last 3 Encounters:  ?11/13/21 128/72  ?09/30/21 130/80  ?05/16/21 132/70  ? ?Stable, pt to continue medical treatment amlodipine, losartan ? ?Allergic rhinitis ?Mild worsening recent weeks with post nasal gtt and sore throat - encouraged to restart the flonase  asd ? ?Followup: Return in about 6 months (around 05/16/2022). ? ?Cathlean Cower, MD 11/17/2021 6:06 AM ?[place] ?[place] ?Internal Medicine ?

## 2021-11-13 NOTE — Assessment & Plan Note (Addendum)
Lab Results  ?Component Value Date  ? SAYTKZSW10 1,431 (H) 11/10/2021  ? ?overcontrolled, to decrease oral replacement - b12 1000 mcg to twice per wk ?

## 2021-11-13 NOTE — Assessment & Plan Note (Signed)
Last vitamin D Lab Results  Component Value Date   VD25OH 55.33 11/10/2021   Stable, cont oral replacement  

## 2021-11-13 NOTE — Assessment & Plan Note (Addendum)
Low TSH uncontrolled, With some suspicion for hyperthyroid - will recheck in 4 wks, and consider endo referral ?

## 2021-11-17 ENCOUNTER — Encounter: Payer: Self-pay | Admitting: Internal Medicine

## 2021-11-17 NOTE — Assessment & Plan Note (Addendum)
Lab Results  ?Component Value Date  ? [place] 42 05/14/2021  ? ?Stable, pt to continue current low chol diet, for f/u lipid panel with labs ? ?

## 2021-11-17 NOTE — Assessment & Plan Note (Signed)
Overall stable, to continue current med tx - tramadol prn ?

## 2021-11-17 NOTE — Assessment & Plan Note (Signed)
Mild worsening recent weeks with post nasal gtt and sore throat - encouraged to restart the flonase asd ?

## 2021-11-17 NOTE — Assessment & Plan Note (Signed)

## 2021-11-17 NOTE — Assessment & Plan Note (Signed)
Lab Results  ?Component Value Date  ? HGBA1C 5.9 11/10/2021  ? ?Stable, pt to continue current medical treatment  - diet ? ?

## 2021-11-17 NOTE — Assessment & Plan Note (Signed)
BP Readings from Last 3 Encounters:  ?11/13/21 128/72  ?09/30/21 130/80  ?05/16/21 132/70  ? ?Stable, pt to continue medical treatment amlodipine, losartan ?

## 2021-11-19 NOTE — Telephone Encounter (Signed)
NOTE NOT NEEDED ?

## 2021-11-27 ENCOUNTER — Telehealth: Payer: Self-pay

## 2021-11-27 NOTE — Telephone Encounter (Signed)
Ok for ROV 

## 2021-11-27 NOTE — Telephone Encounter (Signed)
Pt is interested in having his Gallbladder check. Pt is experiencing pain under bottom on Rib area. Pt says that he has been having Dx of having Gallstones in the past. ? ?Please advise  ?

## 2021-11-27 NOTE — Telephone Encounter (Signed)
NEW NOTE NOT NEEDED °

## 2021-12-03 ENCOUNTER — Encounter: Payer: Self-pay | Admitting: Internal Medicine

## 2021-12-03 ENCOUNTER — Ambulatory Visit (INDEPENDENT_AMBULATORY_CARE_PROVIDER_SITE_OTHER): Payer: PPO | Admitting: Internal Medicine

## 2021-12-03 VITALS — BP 118/70 | HR 66 | Ht 74.0 in | Wt 234.0 lb

## 2021-12-03 DIAGNOSIS — J309 Allergic rhinitis, unspecified: Secondary | ICD-10-CM | POA: Diagnosis not present

## 2021-12-03 DIAGNOSIS — R7989 Other specified abnormal findings of blood chemistry: Secondary | ICD-10-CM

## 2021-12-03 DIAGNOSIS — I10 Essential (primary) hypertension: Secondary | ICD-10-CM | POA: Diagnosis not present

## 2021-12-03 DIAGNOSIS — R1011 Right upper quadrant pain: Secondary | ICD-10-CM

## 2021-12-03 LAB — BASIC METABOLIC PANEL
BUN: 17 mg/dL (ref 6–23)
CO2: 31 mEq/L (ref 19–32)
Calcium: 9.3 mg/dL (ref 8.4–10.5)
Chloride: 103 mEq/L (ref 96–112)
Creatinine, Ser: 0.94 mg/dL (ref 0.40–1.50)
GFR: 82.34 mL/min (ref >60.00)
Glucose, Bld: 147 mg/dL — ABNORMAL HIGH (ref 70–99)
Potassium: 4 mEq/L (ref 3.5–5.1)
Sodium: 140 mEq/L (ref 135–145)

## 2021-12-03 LAB — HEPATIC FUNCTION PANEL
ALT: 20 U/L (ref 0–53)
AST: 21 U/L (ref 0–37)
Albumin: 4.3 g/dL (ref 3.5–5.2)
Alkaline Phosphatase: 59 U/L (ref 39–117)
Bilirubin, Direct: 0.1 mg/dL (ref 0.0–0.3)
Total Bilirubin: 0.5 mg/dL (ref 0.2–1.2)
Total Protein: 6.8 g/dL (ref 6.0–8.3)

## 2021-12-03 LAB — TSH: TSH: 0.45 u[IU]/mL (ref 0.35–5.50)

## 2021-12-03 LAB — T4, FREE: Free T4: 0.86 ng/dL (ref 0.60–1.60)

## 2021-12-03 MED ORDER — TRIAMCINOLONE ACETONIDE 55 MCG/ACT NA AERO: 2.0000 | INHALATION_SPRAY | Freq: Every day | NASAL | 3 refills | Status: DC

## 2021-12-03 MED ORDER — LORATADINE 10 MG PO TABS: 10.0000 mg | ORAL_TABLET | Freq: Every day | ORAL | 3 refills | Status: DC

## 2021-12-03 NOTE — Progress Notes (Signed)
Patient ID: [name] [name], male   DOB: September 09, 1950, 71 y.o.   MRN: [id number] ? ? ? ?    Chief Complaint: follow up RUQ pain, abnormal TSH, and allergies ? ?     HPI:  [name] [name] is a 71 y.o. male here with c/o 1 wk onset mild RUQ pain, sharp, non pleuritic non exertional non positional, intermittent, no radiation and Denies worsening reflux, other abd pain, dysphagia, n/v, bowel change or blood.  Has known hx of gallstones and asks for f/u on this, thinking his GB must be now symptomatic.  Also Denies hyper or hypo thyroid symptoms such as voice, skin or hair change, though he is a crestfallen to find out today his low TSH from last visit may mean overactive thyroid, b/c he had been reading all about hypothyroid and his mother had it and he seems to fit many other low thyroid symptoms.  Does have several wks ongoing nasal allergy symptoms with clearish congestion, itch and sneezing, without fever, pain, ST, cough, swelling or wheezing.  Pt denies other chest pain, increased sob or doe, wheezing, orthopnea, PND, increased LE swelling, palpitations, dizziness or syncope.   Pt denies polydipsia, polyuria, or new focal neuro s/s.    Pt denies fever, wt loss, night sweats, loss of appetite, or other constitutional symptoms   Denies worsening depressive symptoms, suicidal ideation, or panic; has ongoing anxiety, with chronic pressure speech and difficult to respond to his statements as he will not pause in his speech for many minutes. ?      ?Wt Readings from Last 3 Encounters:  ?12/03/21 234 lb (106.1 kg)  ?11/13/21 233 lb (105.7 kg)  ?09/30/21 234 lb 9.6 oz (106.4 kg)  ? ?BP Readings from Last 3 Encounters:  ?12/03/21 118/70  ?11/13/21 128/72  ?09/30/21 130/80  ? ?      ?Past Medical History:  ?Diagnosis Date  ? Allergic rhinitis   ? Allergy   ? SEASONAL,USE TO TAKE ALLERGY SHOTS  ? Anxiety   ? Arthritis   ? BACK,KNEES  ? Asthma, mild   ? Borderline diabetes mellitus   ? Depression   ? Diverticulosis of colon   ?  Gallstones   ? Generalized anxiety disorder   ? GERD (gastroesophageal reflux disease)   ? Hemorrhoids   ? History of colonic polyps   ? Hyperlipidemia   ? Hypertension   ? Lumbar back pain   ? Obesity   ? Venous insufficiency   ? ?Past Surgical History:  ?Procedure Laterality Date  ? COLONOSCOPY  2011  ? LUMBAR FUSION    ? LUMBAR LAMINECTOMY  [id number]  ? POLYPECTOMY    ? ? reports that he has never smoked. He has never used smokeless tobacco. He reports that he does not drink alcohol and does not use drugs. ?family history includes Colon cancer in his father and maternal grandfather; Diabetes in his father; Heart attack in his father; Lung cancer in his mother; Prostate cancer in his father; Stroke in his father. ?No Known Allergies ?Current Outpatient Medications on File Prior to Visit  ?Medication Sig Dispense Refill  ? albuterol (VENTOLIN HFA) 108 (90 Base) MCG/ACT inhaler Inhale 2 puffs into the lungs every 6 (six) hours as needed for wheezing or shortness of breath. 1 each 0  ? ALPRAZolam (XANAX) 0.5 MG tablet Take 1 tablet (0.5 mg total) by mouth 3 (three) times daily as needed. for anxiety 90 tablet 2  ? amLODipine (NORVASC) 5 MG tablet Take  1 tablet (5 mg total) by mouth daily. 90 tablet 3  ? aspirin 81 MG tablet Take 81 mg by mouth daily.    ? fluticasone (FLONASE) 50 MCG/ACT nasal spray Use 2 spray(s) in each nostril once daily 48 g 5  ? fluticasone-salmeterol (ADVAIR) 250-50 MCG/ACT AEPB Inhale 1 puff into the lungs in the morning and at bedtime. 180 each 3  ? losartan (COZAAR) 50 MG tablet Take 1 tablet (50 mg total) by mouth daily. 90 tablet 3  ? omeprazole (PRILOSEC) 20 MG capsule Take 2 capsules (40 mg total) by mouth daily. 180 capsule 3  ? Probiotic Product (PROBIOTIC PO) Take by mouth.    ? sennosides-docusate sodium (SENOKOT-S) 8.6-50 MG tablet Take 1 tablet by mouth daily.    ? VITAMIN B COMPLEX-C PO Take by mouth.    ? VITAMIN D PO Take by mouth.    ? ?No current facility-administered  medications on file prior to visit.  ? ?     ROS:  All others reviewed and negative. ? ?Objective  ? ?     PE:  BP 118/70 (BP Location: Right Arm, Patient Position: Sitting, Cuff Size: Large)   Pulse 66   Ht '6\' 2"'$  (1.88 m)   Wt 234 lb (106.1 kg)   SpO2 96%   BMI 30.04 kg/m?  ? ?              Constitutional: Pt appears in NAD ?              HENT: Head: NCAT.  ?              Right Ear: External ear normal.   ?              Left Ear: External ear normal.  ?              Eyes: . Pupils are equal, round, and reactive to light. Conjunctivae and EOM are normal ?              Nose: without d/c or deformity ?              Neck: Neck supple. Gross normal ROM ?              Cardiovascular: Normal rate and regular rhythm.   ?              Pulmonary/Chest: Effort normal and breath sounds without rales or wheezing.  ?              Abd:  Soft, mild tender right costal margin, ND, + BS, no organomegaly ?              Neurological: Pt is alert. At baseline orientation, motor grossly intact ?              Skin: Skin is warm. No rashes, no other new lesions, LE edema - none ?              Psychiatric: Pt behavior is normal without agitation , chronic 1-2+ nervous ? ?Micro: none ? ?Cardiac tracings I have personally interpreted today:  none ? ?Pertinent Radiological findings (summarize): none  ? ?Lab Results  ?Component Value Date  ? WBC 8.4 11/10/2021  ? HGB 16.0 11/10/2021  ? HCT 45.1 11/10/2021  ? PLT 288.0 11/10/2021  ? GLUCOSE 147 (H) 12/03/2021  ? CHOL 115 11/10/2021  ? TRIG 267.0 (H) 11/10/2021  ? HDL 35.30 (L) 11/10/2021  ? LDLDIRECT  50.0 11/10/2021  ? [place] 42 05/14/2021  ? ALT 20 12/03/2021  ? AST 21 12/03/2021  ? NA 140 12/03/2021  ? K 4.0 12/03/2021  ? CL 103 12/03/2021  ? CREATININE 0.94 12/03/2021  ? BUN 17 12/03/2021  ? CO2 31 12/03/2021  ? TSH 0.45 12/03/2021  ? PSA 1.28 11/10/2021  ? INR 1.20 12/06/2015  ? HGBA1C 5.9 11/10/2021  ? ?Assessment/Plan:  ?[name] [name] is a 71 y.o. White or Caucasian [1] male with   has a past medical history of Allergic rhinitis, Allergy, Anxiety, Arthritis, Asthma, mild, Borderline diabetes mellitus, Depression, Diverticulosis of colon, Gallstones, Generalized anxiety disorder, GERD (gastroesophageal reflux disease), Hemorrhoids, History of colonic polyps, Hyperlipidemia, Hypertension, Lumbar back pain, Obesity, and Venous insufficiency. ? ?Abnormal TSH ?Mild low tsh last visit, now for f/u TFTs; I suspect probable spurious value ? ?Allergic rhinitis ?Mild to mod, for add claritin otc and nasacort asd,  to f/u any worsening symptoms or concerns ? ?Essential hypertension ?BP Readings from Last 3 Encounters:  ?12/03/21 118/70  ?11/13/21 128/72  ?09/30/21 130/80  ? ?Stable, pt to continue medical treatment amlodipine, losartan ? ? ?RUQ pain ?? msk pain - seems likely but for f/u LFTs and abd u/s next able ? ?Followup: No follow-ups on file. ? ?Cathlean Cower, MD 12/07/2021 11:12 AM ?[place] ?[place] ?Internal Medicine ?

## 2021-12-03 NOTE — Patient Instructions (Addendum)
Please take all new medication as prescribed - the claritin and nasacort ? ?Please continue all other medications as before, and refills have been done if requested. ? ?Please have the pharmacy call with any other refills you may need. ? ?Please continue your efforts at being more active, low cholesterol diet, and weight control ? ?Please keep your appointments with your specialists as you may have planned ? ?You will be contacted regarding the referral for: abdomen ultrasound ? ?Please go to the LAB at the blood drawing area for the tests to be done ? ?You will be contacted by phone if any changes need to be made immediately.  Otherwise, you will receive a letter about your results with an explanation, but please check with MyChart first. ? ?Please remember to sign up for MyChart if you have not done so, as this will be important to you in the future with finding out test results, communicating by private email, and scheduling acute appointments online when needed. ? ? ?

## 2021-12-07 NOTE — Assessment & Plan Note (Signed)
Mild low tsh last visit, now for f/u TFTs; I suspect probable spurious value ?

## 2021-12-07 NOTE — Assessment & Plan Note (Signed)
?   msk pain - seems likely but for f/u LFTs and abd u/s next able ?

## 2021-12-07 NOTE — Assessment & Plan Note (Signed)
BP Readings from Last 3 Encounters:  ?12/03/21 118/70  ?11/13/21 128/72  ?09/30/21 130/80  ? ?Stable, pt to continue medical treatment amlodipine, losartan ? ?

## 2021-12-07 NOTE — Assessment & Plan Note (Signed)
Mild to mod, for add claritin otc and nasacort asd,  to f/u any worsening symptoms or concerns ?

## 2021-12-08 ENCOUNTER — Ambulatory Visit
Admission: RE | Admit: 2021-12-08 | Discharge: 2021-12-08 | Disposition: A | Discharge status: Home / Self Care | Payer: PPO | Source: Ambulatory Visit | Attending: Internal Medicine | Admitting: Internal Medicine

## 2021-12-08 DIAGNOSIS — R1011 Right upper quadrant pain: Secondary | ICD-10-CM

## 2021-12-08 DIAGNOSIS — K8020 Calculus of gallbladder without cholecystitis without obstruction: Secondary | ICD-10-CM | POA: Diagnosis not present

## 2021-12-08 DIAGNOSIS — K760 Fatty (change of) liver, not elsewhere classified: Secondary | ICD-10-CM | POA: Diagnosis not present

## 2021-12-11 ENCOUNTER — Other Ambulatory Visit: Payer: PPO

## 2021-12-29 DIAGNOSIS — L309 Dermatitis, unspecified: Secondary | ICD-10-CM | POA: Diagnosis not present

## 2021-12-29 DIAGNOSIS — L821 Other seborrheic keratosis: Secondary | ICD-10-CM | POA: Diagnosis not present

## 2021-12-29 DIAGNOSIS — L720 Epidermal cyst: Secondary | ICD-10-CM | POA: Diagnosis not present

## 2022-01-26 ENCOUNTER — Emergency Department (HOSPITAL_COMMUNITY)
Admission: EM | Admit: 2022-01-26 | Discharge: 2022-01-26 | Disposition: A | Discharge status: Home / Self Care | Payer: PPO | Attending: Emergency Medicine | Admitting: Emergency Medicine

## 2022-01-26 ENCOUNTER — Emergency Department (HOSPITAL_COMMUNITY): Payer: PPO

## 2022-01-26 ENCOUNTER — Encounter (HOSPITAL_COMMUNITY): Payer: Self-pay | Admitting: Emergency Medicine

## 2022-01-26 DIAGNOSIS — Z79899 Other long term (current) drug therapy: Secondary | ICD-10-CM | POA: Insufficient documentation

## 2022-01-26 DIAGNOSIS — J029 Acute pharyngitis, unspecified: Secondary | ICD-10-CM | POA: Diagnosis not present

## 2022-01-26 DIAGNOSIS — K5730 Diverticulosis of large intestine without perforation or abscess without bleeding: Secondary | ICD-10-CM | POA: Diagnosis not present

## 2022-01-26 DIAGNOSIS — R1084 Generalized abdominal pain: Secondary | ICD-10-CM | POA: Insufficient documentation

## 2022-01-26 DIAGNOSIS — N281 Cyst of kidney, acquired: Secondary | ICD-10-CM | POA: Diagnosis not present

## 2022-01-26 DIAGNOSIS — Z7982 Long term (current) use of aspirin: Secondary | ICD-10-CM | POA: Diagnosis not present

## 2022-01-26 DIAGNOSIS — R0789 Other chest pain: Secondary | ICD-10-CM | POA: Diagnosis not present

## 2022-01-26 DIAGNOSIS — K760 Fatty (change of) liver, not elsewhere classified: Secondary | ICD-10-CM | POA: Diagnosis not present

## 2022-01-26 DIAGNOSIS — R7309 Other abnormal glucose: Secondary | ICD-10-CM | POA: Insufficient documentation

## 2022-01-26 DIAGNOSIS — K5900 Constipation, unspecified: Secondary | ICD-10-CM | POA: Diagnosis not present

## 2022-01-26 DIAGNOSIS — R5383 Other fatigue: Secondary | ICD-10-CM | POA: Diagnosis not present

## 2022-01-26 DIAGNOSIS — I10 Essential (primary) hypertension: Secondary | ICD-10-CM | POA: Diagnosis not present

## 2022-01-26 DIAGNOSIS — K8020 Calculus of gallbladder without cholecystitis without obstruction: Secondary | ICD-10-CM | POA: Diagnosis not present

## 2022-01-26 DIAGNOSIS — R000 Tachycardia, unspecified: Secondary | ICD-10-CM | POA: Diagnosis not present

## 2022-01-26 LAB — URINALYSIS, ROUTINE W REFLEX MICROSCOPIC
Bilirubin Urine: NEGATIVE
Glucose, UA: NEGATIVE mg/dL
Hgb urine dipstick: NEGATIVE
Ketones, ur: NEGATIVE mg/dL
Leukocytes,Ua: NEGATIVE
Nitrite: NEGATIVE
Protein, ur: NEGATIVE mg/dL
Specific Gravity, Urine: 1.005 (ref 1.005–1.030)
pH: 6 (ref 5.0–8.0)

## 2022-01-26 LAB — COMPREHENSIVE METABOLIC PANEL
ALT: 26 U/L (ref 0–44)
AST: 21 U/L (ref 15–41)
Albumin: 4.1 g/dL (ref 3.5–5.0)
Alkaline Phosphatase: 54 U/L (ref 38–126)
Anion gap: 8 (ref 5–15)
BUN: 8 mg/dL (ref 8–23)
CO2: 25 mmol/L (ref 22–32)
Calcium: 9.4 mg/dL (ref 8.9–10.3)
Chloride: 105 mmol/L (ref 98–111)
Creatinine, Ser: 0.77 mg/dL (ref 0.61–1.24)
GFR, Estimated: >60 mL/min (ref >60)
Glucose, Bld: 159 mg/dL — ABNORMAL HIGH (ref 70–99)
Potassium: 3.6 mmol/L (ref 3.5–5.1)
Sodium: 138 mmol/L (ref 135–145)
Total Bilirubin: 0.7 mg/dL (ref 0.3–1.2)
Total Protein: 7.2 g/dL (ref 6.5–8.1)

## 2022-01-26 LAB — CBC
HCT: 49.4 % (ref 39.0–52.0)
Hemoglobin: 18.3 g/dL — ABNORMAL HIGH (ref 13.0–17.0)
MCH: 32.8 pg (ref 26.0–34.0)
MCHC: 37 g/dL — ABNORMAL HIGH (ref 30.0–36.0)
MCV: 88.5 fL (ref 80.0–100.0)
Platelets: 328 10*3/uL (ref 150–400)
RBC: 5.58 MIL/uL (ref 4.22–5.81)
RDW: 12 % (ref 11.5–15.5)
WBC: 10 10*3/uL (ref 4.0–10.5)
nRBC: 0 % (ref 0.0–0.2)

## 2022-01-26 LAB — LIPASE, BLOOD: Lipase: 28 U/L (ref 11–51)

## 2022-01-26 LAB — TROPONIN I (HIGH SENSITIVITY): Troponin I (High Sensitivity): 3 ng/L (ref <18)

## 2022-01-26 MED ORDER — IOHEXOL 300 MG/ML  SOLN
100.0000 mL | Freq: Once | INTRAMUSCULAR | Status: AC | PRN
  Administered 2022-01-26: 100 mL via INTRAVENOUS

## 2022-01-26 NOTE — Discharge Instructions (Addendum)
You were seen in the emergency department for ongoing sore throat, some right-sided abdominal pain and general fatigue.  You had blood work, EKG, and a CAT scan of your abdomen and pelvis that did not show any acute findings.  You do have gallstones and hepatic steatosis and this will need follow-up with your primary care doctor.  Please contact them for close follow-up.  Return to the emergency department if any worsening or concerning symptoms.

## 2022-01-26 NOTE — ED Triage Notes (Addendum)
Pt arrived via [facility] from home.   C/O generalized abdominal X2 months.   Multiple issues per ems.    Pt reports he might have thyroid issues and anxiety.   A/Ox4 Ambulatory in triage   160/106 P-124 [id number]

## 2022-01-26 NOTE — ED Provider Notes (Signed)
[facility] DEPT Provider Note   CSN: [id number] Arrival date & time: 01/26/22  1223     History {Add pertinent medical, surgical, social history, OB history to HPI:1} Chief Complaint  Patient presents with   Abdominal Pain    And multiple issues   Fatigue    [name] [name] is a 71 y.o. male.  He is here with multiple complaints today.  He said he has had a sore throat since January and his doctor treated him with steroids and antibiotics.  He is concerned it may be his thyroid and had a low TSH recently although the repeat was back in the normal range.  He is also had a few days of waking up feeling "tired eyed."  Also having some right greater than left upper abdominal discomfort.  He has a history of gallstones although was told he does not need a surgery.  He is also had a history of diverticulosis and needed to have some of that removed and he is not sure if it is grown back.  No fevers or chills.  Today he noticed some tightness in his chest when he woke up which he thinks is from his pectorals. he called his primary care doctor today who told him he should come to the emergency department to get checked out.  The history is provided by the patient.  Abdominal Pain Pain location:  RUQ, LUQ and epigastric Pain quality: aching   Onset quality:  Gradual Duration:  3 days Timing:  Intermittent Progression:  Unchanged Chronicity:  Recurrent Context: laxative use   Context: not trauma   Relieved by:  None tried Worsened by:  Nothing Ineffective treatments:  None tried Associated symptoms: chest pain, constipation, fatigue and sore throat   Associated symptoms: no cough, no dysuria, no fever, no nausea and no vomiting       Home Medications Prior to Admission medications   Medication Sig Start Date End Date Taking? Authorizing Provider  albuterol (VENTOLIN HFA) 108 (90 Base) MCG/ACT inhaler Inhale 2 puffs into the lungs every 6 (six) hours as  needed for wheezing or shortness of breath. 09/30/21   Hoyt Koch, MD  ALPRAZolam [name]) 0.5 MG tablet Take 1 tablet (0.5 mg total) by mouth 3 (three) times daily as needed. for anxiety 11/13/21   Biagio Borg, MD  amLODipine (NORVASC) 5 MG tablet Take 1 tablet (5 mg total) by mouth daily. 11/13/21   Biagio Borg, MD  aspirin 81 MG tablet Take 81 mg by mouth daily.    [provider]  fluticasone Asencion Islam) 50 MCG/ACT nasal spray Use 2 spray(s) in each nostril once daily 11/11/20   Biagio Borg, MD  fluticasone-salmeterol (ADVAIR) 250-50 MCG/ACT AEPB Inhale 1 puff into the lungs in the morning and at bedtime. 11/13/21   Biagio Borg, MD  loratadine (CLARITIN) 10 MG tablet Take 1 tablet (10 mg total) by mouth daily. 12/03/21   Biagio Borg, MD  losartan (COZAAR) 50 MG tablet Take 1 tablet (50 mg total) by mouth daily. 11/13/21   Biagio Borg, MD  omeprazole (PRILOSEC) 20 MG capsule Take 2 capsules (40 mg total) by mouth daily. 11/13/21   Biagio Borg, MD  Probiotic Product (PROBIOTIC PO) Take by mouth.    [provider]  sennosides-docusate sodium (SENOKOT-S) 8.6-50 MG tablet Take 1 tablet by mouth daily.    [provider]  triamcinolone (NASACORT) 55 MCG/ACT AERO nasal inhaler Place 2 sprays  into the nose daily. 12/03/21   Biagio Borg, MD  VITAMIN B COMPLEX-C PO Take by mouth.    [provider]  VITAMIN D PO Take by mouth.    [provider]      Allergies    Patient has no known allergies.    Review of Systems   Review of Systems  Constitutional:  Positive for fatigue. Negative for fever.  HENT:  Positive for sore throat.   Respiratory:  Negative for cough.   Cardiovascular:  Positive for chest pain.  Gastrointestinal:  Positive for abdominal pain and constipation. Negative for nausea and vomiting.  Genitourinary:  Negative for dysuria.  Skin:  Negative for rash.  Neurological:  Negative for syncope.   Physical Exam Updated  Vital Signs BP (!) 144/88   Pulse 92   Temp 97.8 F (36.6 C) (Oral)   Resp 18   SpO2 99%  Physical Exam Vitals and nursing note reviewed.  Constitutional:      General: He is not in acute distress.    Appearance: Normal appearance. He is well-developed.  HENT:     Head: Normocephalic and atraumatic.  Eyes:     Conjunctiva/sclera: Conjunctivae normal.  Cardiovascular:     Rate and Rhythm: Normal rate and regular rhythm.     Pulses: Normal pulses.     Heart sounds: No murmur heard. Pulmonary:     Effort: Pulmonary effort is normal. No respiratory distress.     Breath sounds: Normal breath sounds.  Abdominal:     Palpations: Abdomen is soft. There is no mass.     Tenderness: There is no abdominal tenderness. There is no guarding or rebound.  Musculoskeletal:        General: No swelling.     Cervical back: Neck supple.     Right lower leg: No edema.     Left lower leg: No edema.  Lymphadenopathy:     Cervical: No cervical adenopathy.  Skin:    General: Skin is warm and dry.     Capillary Refill: Capillary refill takes less than 2 seconds.  Neurological:     General: No focal deficit present.     Mental Status: He is alert.    ED Results / Procedures / Treatments   Labs (all labs ordered are listed, but only abnormal results are displayed) Labs Reviewed  CBC - Abnormal; Notable for the following components:      Result Value   Hemoglobin 18.3 (*)    MCHC 37.0 (*)    All other components within normal limits  URINALYSIS, ROUTINE W REFLEX MICROSCOPIC - Abnormal; Notable for the following components:   APPearance HAZY (*)    All other components within normal limits  COMPREHENSIVE METABOLIC PANEL - Abnormal; Notable for the following components:   Glucose, Bld 159 (*)    All other components within normal limits  LIPASE, BLOOD    EKG None  Radiology No results found.  Procedures Procedures  {Document cardiac monitor, telemetry assessment procedure when  appropriate:1}  Medications Ordered in ED Medications - No data to display  ED Course/ Medical Decision Making/ A&P                           Medical Decision Making Amount and/or Complexity of Data Reviewed Labs: ordered. Radiology: ordered.  This patient complains of ***; this involves an extensive number of treatment Options and is a complaint that carries with it  a high risk of complications and morbidity. The differential includes ***  I ordered, reviewed and interpreted labs, which included *** I ordered medication *** and reviewed PMP when indicated. I ordered imaging studies which included *** and I independently    visualized and interpreted imaging which showed *** Additional history obtained from *** Previous records obtained and reviewed *** I consulted *** and discussed lab and imaging findings and discussed disposition.  Cardiac monitoring reviewed, *** Social determinants considered, *** Critical Interventions: ***  After the interventions stated above, I reevaluated the patient and found *** Admission and further testing considered, ***    {Document critical care time when appropriate:1} {Document review of labs and clinical decision tools ie heart score, Chads2Vasc2 etc:1}  {Document your independent review of radiology images, and any outside records:1} {Document your discussion with family members, caretakers, and with consultants:1} {Document social determinants of health affecting pt's care:1} {Document your decision making why or why not admission, treatments were needed:1} Final Clinical Impression(s) / ED Diagnoses Final diagnoses:  None    Rx / DC Orders ED Discharge Orders     None

## 2022-02-04 ENCOUNTER — Ambulatory Visit (INDEPENDENT_AMBULATORY_CARE_PROVIDER_SITE_OTHER): Payer: PPO | Admitting: Internal Medicine

## 2022-02-04 ENCOUNTER — Encounter: Payer: Self-pay | Admitting: Internal Medicine

## 2022-02-04 VITALS — BP 120/76 | HR 79 | Temp 97.6°F | Ht 74.0 in | Wt 235.2 lb

## 2022-02-04 DIAGNOSIS — E538 Deficiency of other specified B group vitamins: Secondary | ICD-10-CM | POA: Diagnosis not present

## 2022-02-04 DIAGNOSIS — K760 Fatty (change of) liver, not elsewhere classified: Secondary | ICD-10-CM | POA: Diagnosis not present

## 2022-02-04 DIAGNOSIS — I10 Essential (primary) hypertension: Secondary | ICD-10-CM | POA: Diagnosis not present

## 2022-02-04 DIAGNOSIS — K8080 Other cholelithiasis without obstruction: Secondary | ICD-10-CM

## 2022-02-04 DIAGNOSIS — R7303 Prediabetes: Secondary | ICD-10-CM | POA: Diagnosis not present

## 2022-02-04 DIAGNOSIS — E559 Vitamin D deficiency, unspecified: Secondary | ICD-10-CM

## 2022-02-04 DIAGNOSIS — K8020 Calculus of gallbladder without cholecystitis without obstruction: Secondary | ICD-10-CM | POA: Insufficient documentation

## 2022-02-04 NOTE — Progress Notes (Signed)
Patient ID: [name] [name], male   DOB: [birth year], 71 y.o.   MRN: [id number]        Chief Complaint: follow up ST and ED visit may 29       HPI:  [name] [name] is a 71 y.o. male here with recent ST seen in ED 5/29 now resolved.  BP is improved as well.  Pt denies chest pain, increased sob or doe, wheezing, orthopnea, PND, increased LE swelling, palpitations, dizziness or syncope.   Pt denies polydipsia, polyuria, or new focal neuro s/s.    Pt denies fever, wt loss, night sweats, loss of appetite, or other constitutional symptoms  Did have CT imaging with asymptomatic Gallstones, and fatty liver.         Wt Readings from Last 3 Encounters:  02/04/22 235 lb 3.2 oz (106.7 kg)  12/03/21 234 lb (106.1 kg)  11/13/21 233 lb (105.7 kg)   BP Readings from Last 3 Encounters:  02/04/22 120/76  01/26/22 (!) 153/97  12/03/21 118/70         Past Medical History:  Diagnosis Date   Allergic rhinitis    Allergy    SEASONAL,USE TO TAKE ALLERGY SHOTS   Anxiety    Arthritis    BACK,KNEES   Asthma, mild    Borderline diabetes mellitus    Depression    Diverticulosis of colon    Gallstones    Generalized anxiety disorder    GERD (gastroesophageal reflux disease)    Hemorrhoids    History of colonic polyps    Hyperlipidemia    Hypertension    Lumbar back pain    Obesity    Venous insufficiency    Past Surgical History:  Procedure Laterality Date   COLONOSCOPY  2011   LUMBAR FUSION     LUMBAR LAMINECTOMY  1157,2620   POLYPECTOMY      reports that he has never smoked. He has never used smokeless tobacco. He reports that he does not drink alcohol and does not use drugs. family history includes Colon cancer in his father and maternal grandfather; Diabetes in his father; Heart attack in his father; Lung cancer in his mother; Prostate cancer in his father; Stroke in his father. No Known Allergies Current Outpatient Medications on File Prior to Visit  Medication Sig Dispense Refill    albuterol (VENTOLIN HFA) 108 (90 Base) MCG/ACT inhaler Inhale 2 puffs into the lungs every 6 (six) hours as needed for wheezing or shortness of breath. 1 each 0   ALPRAZolam (XANAX) 0.5 MG tablet Take 1 tablet (0.5 mg total) by mouth 3 (three) times daily as needed. for anxiety 90 tablet 2   amLODipine (NORVASC) 5 MG tablet Take 1 tablet (5 mg total) by mouth daily. 90 tablet 3   aspirin 81 MG tablet Take 81 mg by mouth daily.     fluticasone (FLONASE) 50 MCG/ACT nasal spray Use 2 spray(s) in each nostril once daily 48 g 5   fluticasone-salmeterol (ADVAIR) 250-50 MCG/ACT AEPB Inhale 1 puff into the lungs in the morning and at bedtime. 180 each 3   loratadine (CLARITIN) 10 MG tablet Take 1 tablet (10 mg total) by mouth daily. 90 tablet 3   losartan (COZAAR) 50 MG tablet Take 1 tablet (50 mg total) by mouth daily. 90 tablet 3   omeprazole (PRILOSEC) 20 MG capsule Take 2 capsules (40 mg total) by mouth daily. 180 capsule 3   Probiotic Product (PROBIOTIC PO) Take by mouth.     sennosides-docusate  sodium (SENOKOT-S) 8.6-50 MG tablet Take 1 tablet by mouth daily.     triamcinolone (NASACORT) 55 MCG/ACT AERO nasal inhaler Place 2 sprays into the nose daily. 3 each 3   VITAMIN B COMPLEX-C PO Take by mouth.     VITAMIN D PO Take by mouth.     No current facility-administered medications on file prior to visit.        ROS:  All others reviewed and negative.  Objective        PE:  BP 120/76 (BP Location: Right Arm, Patient Position: Sitting, Cuff Size: Large)   Pulse 79   Temp 97.6 F (36.4 C) (Oral)   Ht '6\' 2"'$  (1.88 m)   Wt 235 lb 3.2 oz (106.7 kg)   SpO2 96%   BMI 30.20 kg/m                 Constitutional: Pt appears in NAD               HENT: Head: NCAT.                Right Ear: External ear normal.                 Left Ear: External ear normal.                Eyes: . Pupils are equal, round, and reactive to light. Conjunctivae and EOM are normal               Nose: without d/c or  deformity               Neck: Neck supple. Gross normal ROM               Cardiovascular: Normal rate and regular rhythm.                 Pulmonary/Chest: Effort normal and breath sounds without rales or wheezing.                Abd:  Soft, NT, ND, + BS, no organomegaly               Neurological: Pt is alert. At baseline orientation, motor grossly intact               Skin: Skin is warm. No rashes, no other new lesions, LE edema - none               Psychiatric: Pt behavior is normal without agitation   Micro: none  Cardiac tracings I have personally interpreted today:  none  Pertinent Radiological findings (summarize): none   Lab Results  Component Value Date   WBC 10.0 01/26/2022   HGB 18.3 (H) 01/26/2022   HCT 49.4 01/26/2022   PLT 328 01/26/2022   GLUCOSE 159 (H) 01/26/2022   CHOL 115 11/10/2021   TRIG 267.0 (H) 11/10/2021   HDL 35.30 (L) 11/10/2021   LDLDIRECT 50.0 11/10/2021   LDLCALC 42 05/14/2021   ALT 26 01/26/2022   AST 21 01/26/2022   NA 138 01/26/2022   K 3.6 01/26/2022   CL 105 01/26/2022   CREATININE 0.77 01/26/2022   BUN 8 01/26/2022   CO2 25 01/26/2022   TSH 0.45 12/03/2021   PSA 1.28 11/10/2021   INR 1.20 12/06/2015   HGBA1C 5.9 11/10/2021   Assessment/Plan:  [name] [name] is a 71 y.o. White or Caucasian [1] male with  has a past medical history of Allergic rhinitis, Allergy, Anxiety,  Arthritis, Asthma, mild, Borderline diabetes mellitus, Depression, Diverticulosis of colon, Gallstones, Generalized anxiety disorder, GERD (gastroesophageal reflux disease), Hemorrhoids, History of colonic polyps, Hyperlipidemia, Hypertension, Lumbar back pain, Obesity, and Venous insufficiency.  Fatty liver Noted on recent imaging - for Wt control, low fat diet,  to f/u any worsening symptoms or concerns  Essential hypertension BP Readings from Last 3 Encounters:  02/04/22 120/76  01/26/22 (!) 153/97  12/03/21 118/70   Stable, pt to continue medical treatment  amlodipine 5 qd, and losartan 50 qd   B12 deficiency Lab Results  Component Value Date   VITAMINB12 1,431 (H) 11/10/2021   Stable, cont oral replacement - b12 1000 mcg qd   Vitamin D deficiency Last vitamin D Lab Results  Component Value Date   VD25OH 55.33 11/10/2021   Stable, cont oral replacement   Cholelithiasis Asympt, pt reassured,  to f/u any worsening symptoms or concerns  Pre-diabetes Lab Results  Component Value Date   HGBA1C 5.9 11/10/2021   Stable, pt to continue current medical treatment  - diet, wt control  Followup: Return in about 6 months (around 08/06/2022).  Cathlean Cower, MD 02/05/2022 9:13 PM [facility]

## 2022-02-04 NOTE — Patient Instructions (Addendum)
Please continue all other medications as before, and refills have been done if requested.  Please have the pharmacy call with any other refills you may need.  Please keep your appointments with your specialists as you may have planned  Please make an Appointment to return in 6 months, or sooner if needed

## 2022-02-05 ENCOUNTER — Encounter: Payer: Self-pay | Admitting: Internal Medicine

## 2022-02-05 NOTE — Assessment & Plan Note (Signed)
BP Readings from Last 3 Encounters:  02/04/22 120/76  01/26/22 (!) 153/97  12/03/21 118/70   Stable, pt to continue medical treatment amlodipine 5 qd, and losartan 50 qd

## 2022-02-05 NOTE — Assessment & Plan Note (Signed)
Lab Results  Component Value Date   HGBA1C 5.9 11/10/2021   Stable, pt to continue current medical treatment  - diet, wt control

## 2022-02-05 NOTE — Assessment & Plan Note (Signed)
Asympt, pt reassured,  to f/u any worsening symptoms or concerns

## 2022-02-05 NOTE — Assessment & Plan Note (Signed)
Lab Results  Component Value Date   VITAMINB12 1,431 (H) 11/10/2021   Stable, cont oral replacement - b12 1000 mcg qd

## 2022-02-05 NOTE — Assessment & Plan Note (Signed)
Noted on recent imaging - for Wt control, low fat diet,  to f/u any worsening symptoms or concerns

## 2022-02-05 NOTE — Assessment & Plan Note (Signed)
Last vitamin D Lab Results  Component Value Date   VD25OH 55.33 11/10/2021   Stable, cont oral replacement  

## 2022-03-12 DIAGNOSIS — L280 Lichen simplex chronicus: Secondary | ICD-10-CM | POA: Diagnosis not present

## 2022-03-18 ENCOUNTER — Telehealth: Payer: Self-pay | Admitting: Internal Medicine

## 2022-03-18 MED ORDER — ALPRAZOLAM 0.5 MG PO TABS: 0.5000 mg | ORAL_TABLET | Freq: Three times a day (TID) | ORAL | 2 refills | Status: DC | PRN

## 2022-03-18 NOTE — Telephone Encounter (Signed)
Caller & Relationship to patient:Self   Call back number: [phone]  Date of last office visit:02/04/22   Date of next office visit:   Medication(s) to be refilled:ALPRAZolam [name]) 0.5 MG tablet        Preferred Pharmacy:  [facility], [place] Phone:  [phone]

## 2022-03-18 NOTE — Telephone Encounter (Signed)
Pt stated he spoke with [facility] on [address] a couple of minutes ago and they advised him they are out of stock of rx. Pt stated [facility] on [address] does have it. Pt would like rx sent to: [facility], [place]. Phone:  [phone]  Fax:  [phone]

## 2022-03-19 NOTE — Telephone Encounter (Signed)
Already done July 19

## 2022-03-24 ENCOUNTER — Telehealth: Payer: Self-pay | Admitting: Internal Medicine

## 2022-03-24 NOTE — Telephone Encounter (Signed)
Patient would like a clinical person to call in regards to lab results. He has questions about thyroid lab results and a referral to  endocrinology.

## 2022-03-26 DIAGNOSIS — D692 Other nonthrombocytopenic purpura: Secondary | ICD-10-CM | POA: Diagnosis not present

## 2022-03-26 DIAGNOSIS — L309 Dermatitis, unspecified: Secondary | ICD-10-CM | POA: Diagnosis not present

## 2022-03-26 DIAGNOSIS — L820 Inflamed seborrheic keratosis: Secondary | ICD-10-CM | POA: Diagnosis not present

## 2022-03-26 NOTE — Telephone Encounter (Addendum)
Spoke with patient and answered questions regarding thyroid, fatty liver, and Alprazolam prescription.

## 2022-03-31 ENCOUNTER — Ambulatory Visit: Payer: PPO

## 2022-05-12 ENCOUNTER — Telehealth: Payer: Self-pay | Admitting: Internal Medicine

## 2022-05-12 NOTE — Telephone Encounter (Signed)
LVM for pt to rtn my call to schedule AWV with [facility] call back # [phone] 

## 2022-06-01 ENCOUNTER — Ambulatory Visit: Payer: PPO

## 2022-06-11 ENCOUNTER — Telehealth: Payer: Self-pay

## 2022-06-11 MED ORDER — ALPRAZOLAM 0.5 MG PO TABS: 0.5000 mg | ORAL_TABLET | Freq: Three times a day (TID) | ORAL | 2 refills | Status: DC | PRN

## 2022-06-11 NOTE — Telephone Encounter (Signed)
MEDICATION: ALPRAZolam [name]) 0.5 MG tablet  PHARMACY: [place], [place] [facility] [address]  Comments:   **Let patient know to contact pharmacy at the end of the day to make sure medication is ready. **  ** Please notify patient to allow 48-72 hours to process**  **Encourage patient to contact the pharmacy for refills or they can request refills through [facility]**

## 2022-06-11 NOTE — Addendum Note (Signed)
Addended by: Biagio Borg on: 06/11/2022 04:51 PM   Modules accepted: Orders

## 2022-06-16 DIAGNOSIS — M5136 Other intervertebral disc degeneration, lumbar region: Secondary | ICD-10-CM | POA: Diagnosis not present

## 2022-06-16 DIAGNOSIS — M542 Cervicalgia: Secondary | ICD-10-CM | POA: Diagnosis not present

## 2022-07-03 DIAGNOSIS — M5416 Radiculopathy, lumbar region: Secondary | ICD-10-CM | POA: Diagnosis not present

## 2022-07-09 DIAGNOSIS — M961 Postlaminectomy syndrome, not elsewhere classified: Secondary | ICD-10-CM | POA: Diagnosis not present

## 2022-07-09 DIAGNOSIS — G894 Chronic pain syndrome: Secondary | ICD-10-CM | POA: Diagnosis not present

## 2022-07-09 DIAGNOSIS — M5416 Radiculopathy, lumbar region: Secondary | ICD-10-CM | POA: Diagnosis not present

## 2022-07-09 DIAGNOSIS — M5136 Other intervertebral disc degeneration, lumbar region: Secondary | ICD-10-CM | POA: Diagnosis not present

## 2022-07-13 ENCOUNTER — Telehealth: Payer: Self-pay | Admitting: Internal Medicine

## 2022-07-13 NOTE — Telephone Encounter (Signed)
Patient called and said he was trying to get his tetanus shot at [facility] at [facility]. He said that they can't do it without his previous record of the old one. He asked if we can send it to them letting them know when he had it

## 2022-07-14 NOTE — Telephone Encounter (Signed)
Spoke with pharmacy and informed them of last tetanus shot which was 07/11/2022.

## 2022-07-15 ENCOUNTER — Encounter: Payer: Self-pay | Admitting: Internal Medicine

## 2022-07-15 ENCOUNTER — Ambulatory Visit (INDEPENDENT_AMBULATORY_CARE_PROVIDER_SITE_OTHER): Payer: PPO | Admitting: Internal Medicine

## 2022-07-15 VITALS — BP 120/72 | HR 89 | Temp 98.8°F | Ht 74.0 in | Wt 236.0 lb

## 2022-07-15 DIAGNOSIS — I10 Essential (primary) hypertension: Secondary | ICD-10-CM

## 2022-07-15 DIAGNOSIS — E559 Vitamin D deficiency, unspecified: Secondary | ICD-10-CM | POA: Diagnosis not present

## 2022-07-15 DIAGNOSIS — J069 Acute upper respiratory infection, unspecified: Secondary | ICD-10-CM | POA: Insufficient documentation

## 2022-07-15 LAB — POC INFLUENZA A&B (BINAX/QUICKVUE)
Influenza A, POC: NEGATIVE
Influenza B, POC: NEGATIVE

## 2022-07-15 LAB — POC SOFIA SARS ANTIGEN FIA: SARS Coronavirus 2 Ag: NEGATIVE

## 2022-07-15 MED ORDER — AZITHROMYCIN 250 MG PO TABS: ORAL_TABLET | ORAL | 1 refills | Status: AC

## 2022-07-15 MED ORDER — HYDROCODONE BIT-HOMATROP MBR 5-1.5 MG/5ML PO SOLN: 5.0000 mL | Freq: Four times a day (QID) | ORAL | 0 refills | Status: AC | PRN

## 2022-07-15 MED ORDER — METHYLPREDNISOLONE ACETATE 80 MG/ML IJ SUSP
80.0000 mg | Freq: Once | INTRAMUSCULAR | Status: AC
  Administered 2022-07-15: 80 mg via INTRAMUSCULAR

## 2022-07-15 NOTE — Assessment & Plan Note (Signed)
BP Readings from Last 3 Encounters:  07/15/22 120/72  02/04/22 120/76  01/26/22 (!) 153/97   Stable, pt to continue medical treatment losartan 50 mg qd, norvasc 5 mg qd

## 2022-07-15 NOTE — Progress Notes (Signed)
Patient ID: [name] [name], male   DOB: [birth year], 71 y.o.   MRN: [id number]       Chief Complaint: follow up [name] [name] symptoms, htn, low vit d       HPI:  [name] [name] is a 71 y.o. male  Here with 2-3 days acute onset fever, facial pain, pressure, headache, general weakness and malaise, and greenish d/c, with mild ST and cough, but pt denies chest pain, wheezing, increased sob or doe, orthopnea, PND, increased LE swelling, palpitations, dizziness or syncope.   Pt denies polydipsia, polyuria, or new focal neuro s/s.    Pt denies other wt loss, night sweats, loss of appetite, or other constitutional symptoms  Taking Vit D       Wt Readings from Last 3 Encounters:  07/15/22 236 lb (107 kg)  02/04/22 235 lb 3.2 oz (106.7 kg)  12/03/21 234 lb (106.1 kg)   BP Readings from Last 3 Encounters:  07/15/22 120/72  02/04/22 120/76  01/26/22 (!) 153/97         Past Medical History:  Diagnosis Date   Allergic rhinitis    Allergy    SEASONAL,USE TO TAKE ALLERGY SHOTS   Anxiety    Arthritis    BACK,KNEES   Asthma, mild    Borderline diabetes mellitus    Depression    Diverticulosis of colon    Gallstones    Generalized anxiety disorder    GERD (gastroesophageal reflux disease)    Hemorrhoids    History of colonic polyps    Hyperlipidemia    Hypertension    Lumbar back pain    Obesity    Venous insufficiency    Past Surgical History:  Procedure Laterality Date   COLONOSCOPY  2011   LUMBAR FUSION     LUMBAR LAMINECTOMY  [id number]   POLYPECTOMY      reports that he has never smoked. He has never used smokeless tobacco. He reports that he does not drink alcohol and does not use drugs. family history includes Colon cancer in his father and maternal grandfather; Diabetes in his father; Heart attack in his father; Lung cancer in his mother; Prostate cancer in his father; Stroke in his father. Allergies  Allergen Reactions   Cephalexin Itching   Current Outpatient Medications on File  Prior to Visit  Medication Sig Dispense Refill   albuterol (VENTOLIN HFA) 108 (90 Base) MCG/ACT inhaler Inhale 2 puffs into the lungs every 6 (six) hours as needed for wheezing or shortness of breath. 1 each 0   ALPRAZolam (XANAX) 0.5 MG tablet Take 1 tablet (0.5 mg total) by mouth 3 (three) times daily as needed. for anxiety 90 tablet 2   amLODipine (NORVASC) 5 MG tablet Take 1 tablet (5 mg total) by mouth daily. 90 tablet 3   aspirin 81 MG tablet Take 81 mg by mouth daily.     fluticasone (FLONASE) 50 MCG/ACT nasal spray Use 2 spray(s) in each nostril once daily 48 g 5   fluticasone-salmeterol (ADVAIR) 250-50 MCG/ACT AEPB Inhale 1 puff into the lungs in the morning and at bedtime. 180 each 3   loratadine (CLARITIN) 10 MG tablet Take 1 tablet (10 mg total) by mouth daily. 90 tablet 3   losartan (COZAAR) 50 MG tablet Take 1 tablet (50 mg total) by mouth daily. 90 tablet 3   omeprazole (PRILOSEC) 20 MG capsule Take 2 capsules (40 mg total) by mouth daily. 180 capsule 3   Probiotic Product (PROBIOTIC PO) Take by mouth.  sennosides-docusate sodium (SENOKOT-S) 8.6-50 MG tablet Take 1 tablet by mouth daily.     triamcinolone (NASACORT) 55 MCG/ACT AERO nasal inhaler Place 2 sprays into the nose daily. 3 each 3   VITAMIN B COMPLEX-C PO Take by mouth.     VITAMIN D PO Take by mouth.     No current facility-administered medications on file prior to visit.        ROS:  All others reviewed and negative.  Objective        PE:  BP 120/72 (BP Location: Right Arm, Patient Position: Sitting, Cuff Size: Large)   Pulse 89   Temp 98.8 F (37.1 C) (Oral)   Ht '6\' 2"'$  (1.88 m)   Wt 236 lb (107 kg)   SpO2 95%   BMI 30.30 kg/m                 Constitutional: Pt appears in NAD               HENT: Head: NCAT.                Right Ear: External ear normal.                 Left Ear: External ear normal. Bilat tm's with mild erythema.  Max sinus areas mild tender.  Pharynx with mild erythema, no exudate                 Eyes: . Pupils are equal, round, and reactive to light. Conjunctivae and EOM are normal               Nose: without d/c or deformity               Neck: Neck supple. Gross normal ROM               Cardiovascular: Normal rate and regular rhythm.                 Pulmonary/Chest: Effort normal and breath sounds without rales or wheezing.                Abd:  Soft, NT, ND, + BS, no organomegaly               Neurological: Pt is alert. At baseline orientation, motor grossly intact               Skin: Skin is warm. No rashes, no other new lesions, LE edema - none               Psychiatric: Pt behavior is normal without agitation   Micro: none  Cardiac tracings I have personally interpreted today:  none  Pertinent Radiological findings (summarize): none   Lab Results  Component Value Date   WBC 10.0 01/26/2022   HGB 18.3 (H) 01/26/2022   HCT 49.4 01/26/2022   PLT 328 01/26/2022   GLUCOSE 159 (H) 01/26/2022   CHOL 115 11/10/2021   TRIG 267.0 (H) 11/10/2021   HDL 35.30 (L) 11/10/2021   LDLDIRECT 50.0 11/10/2021   LDLCALC 42 05/14/2021   ALT 26 01/26/2022   AST 21 01/26/2022   NA 138 01/26/2022   K 3.6 01/26/2022   CL 105 01/26/2022   CREATININE 0.77 01/26/2022   BUN 8 01/26/2022   CO2 25 01/26/2022   TSH 0.45 12/03/2021   PSA 1.28 11/10/2021   INR 1.20 12/06/2015   HGBA1C 5.9 11/10/2021   POCT - COVID and flu negative  Assessment/Plan:  [name] [name] is a 71 y.o. White or Caucasian [1] male with  has a past medical history of Allergic rhinitis, Allergy, Anxiety, Arthritis, Asthma, mild, Borderline diabetes mellitus, Depression, Diverticulosis of colon, Gallstones, Generalized anxiety disorder, GERD (gastroesophageal reflux disease), Hemorrhoids, History of colonic polyps, Hyperlipidemia, Hypertension, Lumbar back pain, Obesity, and Venous insufficiency.  Vitamin D deficiency Last vitamin D Lab Results  Component Value Date   VD25OH 55.33 11/10/2021   Stable,  cont oral replacement   Essential hypertension BP Readings from Last 3 Encounters:  07/15/22 120/72  02/04/22 120/76  01/26/22 (!) 153/97   Stable, pt to continue medical treatment losartan 50 mg qd, norvasc 5 mg qd   Acute upper respiratory infection Covid and flu negative, Mild to mod, for antibx course zpack and cough med prn use,  to f/u any worsening symptoms or concerns  Followup: Return in about 4 weeks (around 08/12/2022).  Cathlean Cower, MD 07/15/2022 3:51 PM [facility]

## 2022-07-15 NOTE — Addendum Note (Signed)
Addended by: Max Sane on: 07/15/2022 04:39 PM   Modules accepted: Orders

## 2022-07-15 NOTE — Assessment & Plan Note (Signed)
Covid and flu negative, Mild to mod, for antibx course zpack and cough med prn use,  to f/u any worsening symptoms or concerns

## 2022-07-15 NOTE — Addendum Note (Signed)
Addended by: Max Sane on: 07/15/2022 04:43 PM   Modules accepted: Orders

## 2022-07-15 NOTE — Patient Instructions (Signed)
Your COVID and flu testing is negative  Please take all new medication as prescribed - the antibiotic and cough medicine  You had the steroid shot today  Please continue all other medications as before, and refills have been done if requested.  Please have the pharmacy call with any other refills you may need.  Please keep your appointments with your specialists as you may have planned

## 2022-07-15 NOTE — Assessment & Plan Note (Signed)
Last vitamin D Lab Results  Component Value Date   VD25OH 55.33 11/10/2021   Stable, cont oral replacement

## 2022-08-03 DIAGNOSIS — M4804 Spinal stenosis, thoracic region: Secondary | ICD-10-CM | POA: Diagnosis not present

## 2022-08-03 DIAGNOSIS — M48061 Spinal stenosis, lumbar region without neurogenic claudication: Secondary | ICD-10-CM | POA: Diagnosis not present

## 2022-08-03 DIAGNOSIS — Z6831 Body mass index (BMI) 31.0-31.9, adult: Secondary | ICD-10-CM | POA: Diagnosis not present

## 2022-08-06 ENCOUNTER — Telehealth: Payer: Self-pay | Admitting: Internal Medicine

## 2022-08-06 DIAGNOSIS — D2262 Melanocytic nevi of left upper limb, including shoulder: Secondary | ICD-10-CM | POA: Diagnosis not present

## 2022-08-06 DIAGNOSIS — L570 Actinic keratosis: Secondary | ICD-10-CM | POA: Diagnosis not present

## 2022-08-06 DIAGNOSIS — L821 Other seborrheic keratosis: Secondary | ICD-10-CM | POA: Diagnosis not present

## 2022-08-06 DIAGNOSIS — R21 Rash and other nonspecific skin eruption: Secondary | ICD-10-CM | POA: Diagnosis not present

## 2022-08-06 DIAGNOSIS — D485 Neoplasm of uncertain behavior of skin: Secondary | ICD-10-CM | POA: Diagnosis not present

## 2022-08-06 DIAGNOSIS — D2261 Melanocytic nevi of right upper limb, including shoulder: Secondary | ICD-10-CM | POA: Diagnosis not present

## 2022-08-06 NOTE — Telephone Encounter (Signed)
Called to schedule AWV with [facility], and patient hung up on me. Please discuss and schedule with patient at his next visit 08/12/22.

## 2022-08-07 ENCOUNTER — Telehealth: Payer: Self-pay | Admitting: Internal Medicine

## 2022-08-07 NOTE — Telephone Encounter (Signed)
Patient wants to know if you will order his labs before his appointment on Wednesday - Please advise patient   Phone:  [phone]   Next:  08/12/2022

## 2022-08-12 ENCOUNTER — Encounter: Payer: Self-pay | Admitting: Internal Medicine

## 2022-08-12 ENCOUNTER — Ambulatory Visit (INDEPENDENT_AMBULATORY_CARE_PROVIDER_SITE_OTHER): Payer: PPO | Admitting: Internal Medicine

## 2022-08-12 VITALS — BP 128/80 | HR 64 | Temp 98.4°F | Ht 74.0 in | Wt 240.0 lb

## 2022-08-12 DIAGNOSIS — R7989 Other specified abnormal findings of blood chemistry: Secondary | ICD-10-CM

## 2022-08-12 DIAGNOSIS — G8929 Other chronic pain: Secondary | ICD-10-CM

## 2022-08-12 DIAGNOSIS — E538 Deficiency of other specified B group vitamins: Secondary | ICD-10-CM | POA: Diagnosis not present

## 2022-08-12 DIAGNOSIS — M5441 Lumbago with sciatica, right side: Secondary | ICD-10-CM

## 2022-08-12 DIAGNOSIS — E7800 Pure hypercholesterolemia, unspecified: Secondary | ICD-10-CM | POA: Diagnosis not present

## 2022-08-12 DIAGNOSIS — E559 Vitamin D deficiency, unspecified: Secondary | ICD-10-CM | POA: Diagnosis not present

## 2022-08-12 DIAGNOSIS — Z125 Encounter for screening for malignant neoplasm of prostate: Secondary | ICD-10-CM

## 2022-08-12 DIAGNOSIS — R7303 Prediabetes: Secondary | ICD-10-CM

## 2022-08-12 LAB — LIPID PANEL
Cholesterol: 128 mg/dL (ref 0–200)
HDL: 37.1 mg/dL — ABNORMAL LOW (ref >39.00)
NonHDL: 90.9
Total CHOL/HDL Ratio: 3
Triglycerides: 258 mg/dL — ABNORMAL HIGH (ref 0.0–149.0)
VLDL: 51.6 mg/dL — ABNORMAL HIGH (ref 0.0–40.0)

## 2022-08-12 LAB — HEPATIC FUNCTION PANEL
ALT: 36 U/L (ref 0–53)
AST: 27 U/L (ref 0–37)
Albumin: 4.6 g/dL (ref 3.5–5.2)
Alkaline Phosphatase: 55 U/L (ref 39–117)
Bilirubin, Direct: 0.1 mg/dL (ref 0.0–0.3)
Total Bilirubin: 0.7 mg/dL (ref 0.2–1.2)
Total Protein: 7.5 g/dL (ref 6.0–8.3)

## 2022-08-12 LAB — TSH: TSH: 0.75 u[IU]/mL (ref 0.35–5.50)

## 2022-08-12 LAB — BASIC METABOLIC PANEL
BUN: 10 mg/dL (ref 6–23)
CO2: 27 mEq/L (ref 19–32)
Calcium: 9.7 mg/dL (ref 8.4–10.5)
Chloride: 99 mEq/L (ref 96–112)
Creatinine, Ser: 1.04 mg/dL (ref 0.40–1.50)
GFR: 72.58 mL/min (ref >60.00)
Glucose, Bld: 126 mg/dL — ABNORMAL HIGH (ref 70–99)
Potassium: 4 mEq/L (ref 3.5–5.1)
Sodium: 135 mEq/L (ref 135–145)

## 2022-08-12 LAB — T4, FREE: Free T4: 0.76 ng/dL (ref 0.60–1.60)

## 2022-08-12 LAB — HEMOGLOBIN A1C: Hgb A1c MFr Bld: 7 % — ABNORMAL HIGH (ref 4.6–6.5)

## 2022-08-12 LAB — LDL CHOLESTEROL, DIRECT: Direct LDL: 62 mg/dL

## 2022-08-12 NOTE — Patient Instructions (Signed)
Please continue all other medications as before, and refills have been done if requested.  Please have the pharmacy call with any other refills you may need.  Please continue your efforts at being more active, low cholesterol diet, and weight control.  Please keep your appointments with your specialists as you may have planned  Please go to the LAB at the blood drawing area for the tests to be done  You will be contacted by phone if any changes need to be made immediately.  Otherwise, you will receive a letter about your results with an explanation, but please check with MyChart first.  Please remember to sign up for MyChart if you have not done so, as this will be important to you in the future with finding out test results, communicating by private email, and scheduling acute appointments online when needed.  Please make an Appointment to return in 6 months, or sooner if needed, also with Lab Appointment for testing done 3-5 days before at the FIRST FLOOR Lab (so this is for TWO appointments - please see the scheduling desk as you leave)  

## 2022-08-12 NOTE — Progress Notes (Signed)
Patient ID: [name] [name], male   DOB: 22-May-1951, 71 y.o.   MRN: [id number]        Chief Complaint: follow up venous insufficiency, hyperglycemia, low thyroid abnormal TSH,  right sciatica, low vit d       HPI:  [name] [name] is a 71 y.o. male here overall doing ok, Pt denies chest pain, increased sob or doe, wheezing, orthopnea, PND, palpitations, dizziness or syncope, but has had mild worsening bilateral distal leg swelling worse at night, better in the AM.   Pt denies polydipsia, polyuria, or new focal neuro s/s.    Pt denies fever, wt loss, night sweats, loss of appetite, or other constitutional symptoms  Pt continues to have recurring right LBP with recurring sciatica, but no bowel or bladder change, fever, wt loss,  worsening LE pain/numbness/weakness, gait change or falls.  Has recently seen [facility] for MRI and PT soon.  Denies hyper or hypo thyroid symptoms such as voice, skin or hair change.         Wt Readings from Last 3 Encounters:  08/12/22 240 lb (108.9 kg)  07/15/22 236 lb (107 kg)  02/04/22 235 lb 3.2 oz (106.7 kg)   BP Readings from Last 3 Encounters:  08/12/22 128/80  07/15/22 120/72  02/04/22 120/76      Past Medical History:  Diagnosis Date   Allergic rhinitis    Allergy    SEASONAL,USE TO TAKE ALLERGY SHOTS   Anxiety    Arthritis    BACK,KNEES   Asthma, mild    Borderline diabetes mellitus    Depression    Diverticulosis of colon    Gallstones    Generalized anxiety disorder    GERD (gastroesophageal reflux disease)    Hemorrhoids    History of colonic polyps    Hyperlipidemia    Hypertension    Lumbar back pain    Obesity    Venous insufficiency    Past Surgical History:  Procedure Laterality Date   COLONOSCOPY  2011   LUMBAR FUSION     LUMBAR LAMINECTOMY  [id number]   POLYPECTOMY      reports that he has never smoked. He has never used smokeless tobacco. He reports that he does not drink alcohol and does not use drugs. family history  includes Colon cancer in his father and maternal grandfather; Diabetes in his father; Heart attack in his father; Lung cancer in his mother; Prostate cancer in his father; Stroke in his father. Allergies  Allergen Reactions   Cephalexin Itching   Current Outpatient Medications on File Prior to Visit  Medication Sig Dispense Refill   albuterol (VENTOLIN HFA) 108 (90 Base) MCG/ACT inhaler Inhale 2 puffs into the lungs every 6 (six) hours as needed for wheezing or shortness of breath. 1 each 0   ALPRAZolam (XANAX) 0.5 MG tablet Take 1 tablet (0.5 mg total) by mouth 3 (three) times daily as needed. for anxiety 90 tablet 2   amLODipine (NORVASC) 5 MG tablet Take 1 tablet (5 mg total) by mouth daily. 90 tablet 3   aspirin 81 MG tablet Take 81 mg by mouth daily.     fluticasone (FLONASE) 50 MCG/ACT nasal spray Use 2 spray(s) in each nostril once daily 48 g 5   fluticasone-salmeterol (ADVAIR) 250-50 MCG/ACT AEPB Inhale 1 puff into the lungs in the morning and at bedtime. 180 each 3   loratadine (CLARITIN) 10 MG tablet Take 1 tablet (10 mg total) by mouth daily. 90 tablet 3  losartan (COZAAR) 50 MG tablet Take 1 tablet (50 mg total) by mouth daily. 90 tablet 3   omeprazole (PRILOSEC) 20 MG capsule Take 2 capsules (40 mg total) by mouth daily. 180 capsule 3   Probiotic Product (PROBIOTIC PO) Take by mouth.     sennosides-docusate sodium (SENOKOT-S) 8.6-50 MG tablet Take 1 tablet by mouth daily.     triamcinolone (NASACORT) 55 MCG/ACT AERO nasal inhaler Place 2 sprays into the nose daily. 3 each 3   VITAMIN B COMPLEX-C PO Take by mouth.     VITAMIN D PO Take by mouth.     No current facility-administered medications on file prior to visit.        ROS:  All others reviewed and negative.  Objective        PE:  BP 128/80 (BP Location: Left Arm, Patient Position: Sitting, Cuff Size: Normal)   Pulse 64   Temp 98.4 F (36.9 C) (Oral)   Ht '6\' 2"'$  (1.88 m)   Wt 240 lb (108.9 kg)   SpO2 94%   BMI  30.81 kg/m                 Constitutional: Pt appears in NAD               HENT: Head: NCAT.                Right Ear: External ear normal.                 Left Ear: External ear normal.                Eyes: . Pupils are equal, round, and reactive to light. Conjunctivae and EOM are normal               Nose: without d/c or deformity               Neck: Neck supple. Gross normal ROM               Cardiovascular: Normal rate and regular rhythm.                 Pulmonary/Chest: Effort normal and breath sounds without rales or wheezing.                Abd:  Soft, NT, ND, + BS, no organomegaly               Neurological: Pt is alert. At baseline orientation, motor grossly intact               Skin: Skin is warm. No rashes, no other new lesions, LE edema - trace bilateral               Psychiatric: Pt behavior is normal without agitation   Micro: none  Cardiac tracings I have personally interpreted today:  none  Pertinent Radiological findings (summarize): none   Lab Results  Component Value Date   WBC 10.0 01/26/2022   HGB 18.3 (H) 01/26/2022   HCT 49.4 01/26/2022   PLT 328 01/26/2022   GLUCOSE 126 (H) 08/12/2022   CHOL 128 08/12/2022   TRIG 258.0 (H) 08/12/2022   HDL 37.10 (L) 08/12/2022   LDLDIRECT 62.0 08/12/2022   LDLCALC 42 05/14/2021   ALT 36 08/12/2022   AST 27 08/12/2022   NA 135 08/12/2022   K 4.0 08/12/2022   CL 99 08/12/2022   CREATININE 1.04 08/12/2022   BUN 10 08/12/2022   CO2 27  08/12/2022   TSH 0.75 08/12/2022   PSA 1.28 11/10/2021   INR 1.20 12/06/2015   HGBA1C 7.0 (H) 08/12/2022   Assessment/Plan:  [name] [name] is a 71 y.o. White or Caucasian [1] male with  has a past medical history of Allergic rhinitis, Allergy, Anxiety, Arthritis, Asthma, mild, Borderline diabetes mellitus, Depression, Diverticulosis of colon, Gallstones, Generalized anxiety disorder, GERD (gastroesophageal reflux disease), Hemorrhoids, History of colonic polyps, Hyperlipidemia,  Hypertension, Lumbar back pain, Obesity, and Venous insufficiency.  Abnormal TSH Lab Results  Component Value Date   TSH 0.75 08/12/2022   Stable on repeat, pt not requiring tx or further eval at this time  Hyperlipidemia Lab Results  Component Value Date   LDLCALC 42 05/14/2021   Stable, pt to continue current low chol diet, declines statin   Pre-diabetes Lab Results  Component Value Date   HGBA1C 7.0 (H) 08/12/2022   Uncontrolled, pt to start metformin ER 500 mg qd   Vitamin D deficiency Last vitamin D Lab Results  Component Value Date   VD25OH 55.33 11/10/2021   Stable, cont oral replacement   BACK PAIN, LUMBAR With recent overall worsening pain, to f/u NS , MRI and PT as planned  Followup: Return in about 6 months (around 02/11/2023).  Cathlean Cower, MD 08/14/2022 8:55 PM [facility]

## 2022-08-13 ENCOUNTER — Telehealth: Payer: Self-pay | Admitting: Internal Medicine

## 2022-08-13 ENCOUNTER — Encounter: Payer: Self-pay | Admitting: Neurosurgery

## 2022-08-13 ENCOUNTER — Other Ambulatory Visit: Payer: Self-pay | Admitting: Neurosurgery

## 2022-08-13 DIAGNOSIS — M4804 Spinal stenosis, thoracic region: Secondary | ICD-10-CM

## 2022-08-13 NOTE — Telephone Encounter (Signed)
Patient called back about message below:   The test results show that your current treatment is OK, as the tests are stable except the A1c has increased to 7.0.  This is borderline for starting medication, so let me know if you would want to do this, such as starting metformin ER 500 mg - 1 per day.  Otherwise, please work on better diet, which I think you have done well in the past.   ...   He would like some more info about metformin and also said he was concerned about his Lipid panel as well. [phone]

## 2022-08-14 ENCOUNTER — Encounter: Payer: Self-pay | Admitting: Internal Medicine

## 2022-08-14 MED ORDER — METFORMIN HCL ER 500 MG PO TB24: 500.0000 mg | ORAL_TABLET | Freq: Every day | ORAL | 3 refills | Status: DC

## 2022-08-14 NOTE — Telephone Encounter (Signed)
Patient states he would like to start metformin but is concerned about the cost. He will also continue working on a better diet as well.

## 2022-08-14 NOTE — Telephone Encounter (Signed)
Ok to let pt know - metformin ER 500 mg is generic and an older medication that is still used quite a bit, low risk, and very little complaints for others about the cost - so I hope he will take this

## 2022-08-14 NOTE — Assessment & Plan Note (Signed)
With recent overall worsening pain, to f/u NS , MRI and PT as planned

## 2022-08-14 NOTE — Telephone Encounter (Signed)
Ok this was done

## 2022-08-14 NOTE — Assessment & Plan Note (Signed)
Lab Results  Component Value Date   TSH 0.75 08/12/2022   Stable on repeat, pt not requiring tx or further eval at this time

## 2022-08-14 NOTE — Assessment & Plan Note (Signed)
Lab Results  Component Value Date   LDLCALC 42 05/14/2021   Stable, pt to continue current low chol diet, declines statin

## 2022-08-14 NOTE — Assessment & Plan Note (Signed)
Lab Results  Component Value Date   HGBA1C 7.0 (H) 08/12/2022   Uncontrolled, pt to start metformin ER 500 mg qd

## 2022-08-14 NOTE — Assessment & Plan Note (Signed)
Last vitamin D Lab Results  Component Value Date   VD25OH 55.33 11/10/2021   Stable, cont oral replacement

## 2022-08-14 NOTE — Telephone Encounter (Signed)
Patient informed about metformin and would like to proceed

## 2022-09-01 ENCOUNTER — Ambulatory Visit
Admission: RE | Admit: 2022-09-01 | Discharge: 2022-09-01 | Disposition: A | Discharge status: Home / Self Care | Payer: PPO | Source: Ambulatory Visit | Attending: Neurosurgery | Admitting: Neurosurgery

## 2022-09-01 DIAGNOSIS — M4804 Spinal stenosis, thoracic region: Secondary | ICD-10-CM

## 2022-09-11 ENCOUNTER — Telehealth: Payer: Self-pay | Admitting: *Deleted

## 2022-09-11 ENCOUNTER — Telehealth: Payer: Self-pay | Admitting: Internal Medicine

## 2022-09-11 ENCOUNTER — Ambulatory Visit (INDEPENDENT_AMBULATORY_CARE_PROVIDER_SITE_OTHER): Payer: PPO | Admitting: Internal Medicine

## 2022-09-11 VITALS — BP 120/64 | HR 81 | Temp 98.3°F | Ht 74.0 in | Wt 241.0 lb

## 2022-09-11 DIAGNOSIS — E1165 Type 2 diabetes mellitus with hyperglycemia: Secondary | ICD-10-CM

## 2022-09-11 DIAGNOSIS — I10 Essential (primary) hypertension: Secondary | ICD-10-CM | POA: Diagnosis not present

## 2022-09-11 DIAGNOSIS — M5441 Lumbago with sciatica, right side: Secondary | ICD-10-CM

## 2022-09-11 DIAGNOSIS — I872 Venous insufficiency (chronic) (peripheral): Secondary | ICD-10-CM

## 2022-09-11 DIAGNOSIS — F411 Generalized anxiety disorder: Secondary | ICD-10-CM

## 2022-09-11 DIAGNOSIS — G8929 Other chronic pain: Secondary | ICD-10-CM

## 2022-09-11 MED ORDER — GABAPENTIN 100 MG PO CAPS: 100.0000 mg | ORAL_CAPSULE | Freq: Three times a day (TID) | ORAL | 5 refills | Status: DC

## 2022-09-11 MED ORDER — TRAMADOL HCL 50 MG PO TABS: 50.0000 mg | ORAL_TABLET | Freq: Four times a day (QID) | ORAL | 2 refills | Status: DC | PRN

## 2022-09-11 MED ORDER — CYCLOBENZAPRINE HCL 5 MG PO TABS: 5.0000 mg | ORAL_TABLET | Freq: Three times a day (TID) | ORAL | 2 refills | Status: DC | PRN

## 2022-09-11 MED ORDER — ALPRAZOLAM 0.5 MG PO TABS: 0.5000 mg | ORAL_TABLET | Freq: Three times a day (TID) | ORAL | 2 refills | Status: DC | PRN

## 2022-09-11 NOTE — Telephone Encounter (Signed)
Err

## 2022-09-11 NOTE — Telephone Encounter (Signed)
Pharmacy called to get clarification if it is ok for the patient to take Tramadol with the Xanax . Please advise

## 2022-09-11 NOTE — Telephone Encounter (Signed)
Ok yes in this case, ok to take both as prescribed

## 2022-09-11 NOTE — Progress Notes (Unsigned)
Patient ID: [name] [name], male   DOB: 11/23/1950, 72 y.o.   MRN: [id number]        Chief Complaint: follow up dm, venous insufficiency, chonic LBP, anxiety       HPI:  [name] [name] is a 72 y.o. male here with ontinues to have recurring LBP without change in severity, bowel or bladder change, fever, wt loss,  worsening LE pain/numbness/weakness, gait change or falls. Is having PT per NS and recent cortisone, out of tramadol, asking for muscle relaxer as well.  Does have f/u appt with NS soon.  Denies worsening depressive symptoms, suicidal ideation, or panic; has ongoing anxiety mod to severe, asking for increased xanax 0.5 to qid.  High pressured speech today again.  Pt denies chest pain, increased sob or doe, wheezing, orthopnea, PND, palpitations, dizziness or syncope, though has some worsening mild leg swelling recently worse in the pm, better in the AM to first getting up. .   Pt denies polydipsia, polyuria, or new focal neuro s/s.  Wondering about getting the [place] system, and concerned about metformin as he has vague "loopiness" and dry eyes with metformin, but after discussion is willing to continue.         Wt Readings from Last 3 Encounters:  09/11/22 241 lb (109.3 kg)  08/12/22 240 lb (108.9 kg)  07/15/22 236 lb (107 kg)   BP Readings from Last 3 Encounters:  09/11/22 120/64  08/12/22 128/80  07/15/22 120/72         Past Medical History:  Diagnosis Date   Allergic rhinitis    Allergy    SEASONAL,USE TO TAKE ALLERGY SHOTS   Anxiety    Arthritis    BACK,KNEES   Asthma, mild    Borderline diabetes mellitus    Depression    Diverticulosis of colon    Gallstones    Generalized anxiety disorder    GERD (gastroesophageal reflux disease)    Hemorrhoids    History of colonic polyps    Hyperlipidemia    Hypertension    Lumbar back pain    Obesity    Venous insufficiency    Past Surgical History:  Procedure Laterality Date   COLONOSCOPY  2011   LUMBAR FUSION      LUMBAR LAMINECTOMY  [id number]   POLYPECTOMY      reports that he has never smoked. He has never used smokeless tobacco. He reports that he does not drink alcohol and does not use drugs. family history includes Colon cancer in his father and maternal grandfather; Diabetes in his father; Heart attack in his father; Lung cancer in his mother; Prostate cancer in his father; Stroke in his father. Allergies  Allergen Reactions   Cephalexin Itching   Current Outpatient Medications on File Prior to Visit  Medication Sig Dispense Refill   albuterol (VENTOLIN HFA) 108 (90 Base) MCG/ACT inhaler Inhale 2 puffs into the lungs every 6 (six) hours as needed for wheezing or shortness of breath. 1 each 0   amLODipine (NORVASC) 5 MG tablet Take 1 tablet (5 mg total) by mouth daily. 90 tablet 3   aspirin 81 MG tablet Take 81 mg by mouth daily.     fluticasone (FLONASE) 50 MCG/ACT nasal spray Use 2 spray(s) in each nostril once daily 48 g 5   fluticasone-salmeterol (ADVAIR) 250-50 MCG/ACT AEPB Inhale 1 puff into the lungs in the morning and at bedtime. 180 each 3   loratadine (CLARITIN) 10 MG tablet Take 1 tablet (10  mg total) by mouth daily. 90 tablet 3   losartan (COZAAR) 50 MG tablet Take 1 tablet (50 mg total) by mouth daily. 90 tablet 3   metFORMIN (GLUCOPHAGE-XR) 500 MG 24 hr tablet Take 1 tablet (500 mg total) by mouth daily with breakfast. 90 tablet 3   omeprazole (PRILOSEC) 20 MG capsule Take 2 capsules (40 mg total) by mouth daily. 180 capsule 3   Probiotic Product (PROBIOTIC PO) Take by mouth.     sennosides-docusate sodium (SENOKOT-S) 8.6-50 MG tablet Take 1 tablet by mouth daily.     triamcinolone (NASACORT) 55 MCG/ACT AERO nasal inhaler Place 2 sprays into the nose daily. 3 each 3   VITAMIN B COMPLEX-C PO Take by mouth.     VITAMIN D PO Take by mouth.     No current facility-administered medications on file prior to visit.        ROS:  All others reviewed and negative.  Objective        PE:   BP 120/64 (BP Location: Right Arm, Patient Position: Sitting, Cuff Size: Large)   Pulse 81   Temp 98.3 F (36.8 C) (Oral)   Ht '6\' 2"'$  (1.88 m)   Wt 241 lb (109.3 kg)   SpO2 95%   BMI 30.94 kg/m                 Constitutional: Pt appears in NAD               HENT: Head: NCAT.                Right Ear: External ear normal.                 Left Ear: External ear normal.                Eyes: . Pupils are equal, round, and reactive to light. Conjunctivae and EOM are normal               Nose: without d/c or deformity               Neck: Neck supple. Gross normal ROM               Cardiovascular: Normal rate and regular rhythm.                 Pulmonary/Chest: Effort normal and breath sounds without rales or wheezing.                Abd:  Soft, NT, ND, + BS, no organomegaly               LS spine diffuse tender midline without sweling or rash               Neurological: Pt is alert. At baseline orientation, motor grossly intact               Skin: Skin is warm. No rashes, no other new lesions, LE edema - trace edema bilateral               Psychiatric: Pt behavior is normal without agitation , severe anxiety  Micro: none  Cardiac tracings I have personally interpreted today:  none  Pertinent Radiological findings (summarize): none   Lab Results  Component Value Date   WBC 10.0 01/26/2022   HGB 18.3 (H) 01/26/2022   HCT 49.4 01/26/2022   PLT 328 01/26/2022   GLUCOSE 126 (H) 08/12/2022   CHOL 128 08/12/2022  TRIG 258.0 (H) 08/12/2022   HDL 37.10 (L) 08/12/2022   LDLDIRECT 62.0 08/12/2022   LDLCALC 42 05/14/2021   ALT 36 08/12/2022   AST 27 08/12/2022   NA 135 08/12/2022   K 4.0 08/12/2022   CL 99 08/12/2022   CREATININE 1.04 08/12/2022   BUN 10 08/12/2022   CO2 27 08/12/2022   TSH 0.75 08/12/2022   PSA 1.28 11/10/2021   INR 1.20 12/06/2015   HGBA1C 7.0 (H) 08/12/2022   Assessment/Plan:  [name] [name] is a 72 y.o. White or Caucasian [1] male with  has a past medical  history of Allergic rhinitis, Allergy, Anxiety, Arthritis, Asthma, mild, Borderline diabetes mellitus, Depression, Diverticulosis of colon, Gallstones, Generalized anxiety disorder, GERD (gastroesophageal reflux disease), Hemorrhoids, History of colonic polyps, Hyperlipidemia, Hypertension, Lumbar back pain, Obesity, and Venous insufficiency.  Chronic lower back pain Chronic persistent followed per NS, ok for tramadol restart, add flexeril 5 tid prn, also gabapentin trial 10 tid, and f/u NS and I suggested pain management referral may be needed.    Essential hypertension BP Readings from Last 3 Encounters:  09/11/22 120/64  08/12/22 128/80  07/15/22 120/72   Stable, pt to continue medical treatment norvasc 5 mg qd, losartan 50 qd   Diabetes ([place]) Lab Results  Component Value Date   HGBA1C 7.0 (H) 08/12/2022   Uncontrolled recent having started new metformin ER 500 qd, ok to continue this, for DM diet and Dexcom G7 system if covered with insurance  ANXIETY DISORDER, GENERALIZED Chronic persistent, I asked pt to continue the mid dose tid xanax dosing only to avoid worsening risk of dependence, he declines need for other SSRI or referral for counseling for now  Venous (peripheral) insufficiency Pt encouraged to wear daytime only compression stockings, low salt, wt control  Followup: Return in about 5 months (around 02/11/2023).  Cathlean Cower, MD 09/12/2022 6:23 AM [facility]

## 2022-09-11 NOTE — Patient Instructions (Addendum)
Ok to continue the metformin  Please take all new medication as prescribed - the tramadol, flexeril muscle relaxer, and gabpentin for pain  Please continue all other medications as before, and refills have been done if requested - xanax  Please have the pharmacy call with any other refills you may need.  Please continue your efforts at being more active, low cholesterol diet, and weight control.  Please keep your appointments with your specialists as you may have planned - Neurosurgury  Please make an Appointment to return in June 13, or sooner if needed

## 2022-09-11 NOTE — Telephone Encounter (Signed)
Called pharmacy to inform them of provider response.

## 2022-09-12 ENCOUNTER — Encounter: Payer: Self-pay | Admitting: Internal Medicine

## 2022-09-12 MED ORDER — DEXCOM G7 SENSOR MISC: 3 refills | Status: DC

## 2022-09-12 MED ORDER — DEXCOM G7 RECEIVER DEVI: 0 refills | Status: DC

## 2022-09-12 NOTE — Assessment & Plan Note (Signed)
BP Readings from Last 3 Encounters:  09/11/22 120/64  08/12/22 128/80  07/15/22 120/72   Stable, pt to continue medical treatment norvasc 5 mg qd, losartan 50 qd

## 2022-09-12 NOTE — Assessment & Plan Note (Addendum)
Chronic persistent followed per NS, ok for tramadol restart, add flexeril 5 tid prn, also gabapentin trial 10 tid, and f/u NS and I suggested pain management referral may be needed.

## 2022-09-12 NOTE — Assessment & Plan Note (Signed)
Lab Results  Component Value Date   HGBA1C 7.0 (H) 08/12/2022   Uncontrolled recent having started new metformin ER 500 qd, ok to continue this, for DM diet and Dexcom G7 system if covered with insurance

## 2022-09-12 NOTE — Assessment & Plan Note (Signed)
Pt encouraged to wear daytime only compression stockings, low salt, wt control

## 2022-09-12 NOTE — Assessment & Plan Note (Signed)
Chronic persistent, I asked pt to continue the mid dose tid xanax dosing only to avoid worsening risk of dependence, he declines need for other SSRI or referral for counseling for now

## 2022-10-06 DIAGNOSIS — M5451 Vertebrogenic low back pain: Secondary | ICD-10-CM | POA: Diagnosis not present

## 2022-10-14 DIAGNOSIS — M5451 Vertebrogenic low back pain: Secondary | ICD-10-CM | POA: Diagnosis not present

## 2022-10-26 DIAGNOSIS — Z6831 Body mass index (BMI) 31.0-31.9, adult: Secondary | ICD-10-CM | POA: Diagnosis not present

## 2022-10-26 DIAGNOSIS — M47818 Spondylosis without myelopathy or radiculopathy, sacral and sacrococcygeal region: Secondary | ICD-10-CM | POA: Diagnosis not present

## 2022-11-05 DIAGNOSIS — M47818 Spondylosis without myelopathy or radiculopathy, sacral and sacrococcygeal region: Secondary | ICD-10-CM | POA: Diagnosis not present

## 2022-11-06 ENCOUNTER — Telehealth: Payer: Self-pay | Admitting: Internal Medicine

## 2022-11-06 NOTE — Telephone Encounter (Signed)
Well so we dont have to continue to guess and there are so many glucometers, please ask pt to call insurance or see pharmacist about which glucometer is covered with his particular insurance, thanks

## 2022-11-06 NOTE — Telephone Encounter (Signed)
Left message for a call back from patient 

## 2022-11-06 NOTE — Telephone Encounter (Signed)
Dexcom was denied by insurance, is there something else that you can send in? Please advise

## 2022-11-06 NOTE — Telephone Encounter (Signed)
Patient's Continuous Blood Gluc Sensor (DEXCOM G7 SENSOR) MISC was denied by insurance. He would like to know what else he can do, or if something else needs to be sent in. He would like a callback at [phone].

## 2022-11-08 ENCOUNTER — Other Ambulatory Visit: Payer: Self-pay | Admitting: Internal Medicine

## 2022-11-09 ENCOUNTER — Other Ambulatory Visit: Payer: Self-pay

## 2022-11-09 MED ORDER — ALBUTEROL SULFATE HFA 108 (90 BASE) MCG/ACT IN AERS: 2.0000 | INHALATION_SPRAY | Freq: Four times a day (QID) | RESPIRATORY_TRACT | 0 refills | Status: AC | PRN

## 2022-11-09 NOTE — Telephone Encounter (Signed)
Patient called back and stated that FreeStyle Libre 3 System (the sensor and the patch to his arm) that his insurance will cover. Patient would like to try this. Best callback number is [phone].

## 2022-11-10 MED ORDER — FREESTYLE LIBRE 3 READER DEVI: 1.0000 | Freq: Every day | 0 refills | Status: DC

## 2022-11-10 MED ORDER — FREESTYLE LIBRE 3 SENSOR MISC: 11 refills | Status: DC

## 2022-11-10 NOTE — Addendum Note (Signed)
Addended by: Biagio Borg on: 11/10/2022 12:58 PM   Modules accepted: Orders

## 2022-11-10 NOTE — Telephone Encounter (Signed)
Ok this is done to [facility] 

## 2022-11-10 NOTE — Telephone Encounter (Signed)
Per patient his insurance will cover the freestyle libre 3

## 2022-11-13 ENCOUNTER — Encounter: Payer: Self-pay | Admitting: Family Medicine

## 2022-11-13 ENCOUNTER — Ambulatory Visit (INDEPENDENT_AMBULATORY_CARE_PROVIDER_SITE_OTHER): Payer: PPO | Admitting: Family Medicine

## 2022-11-13 VITALS — BP 138/84 | HR 65 | Temp 97.4°F | Ht 74.0 in | Wt 243.0 lb

## 2022-11-13 DIAGNOSIS — M47818 Spondylosis without myelopathy or radiculopathy, sacral and sacrococcygeal region: Secondary | ICD-10-CM | POA: Insufficient documentation

## 2022-11-13 DIAGNOSIS — E1165 Type 2 diabetes mellitus with hyperglycemia: Secondary | ICD-10-CM | POA: Diagnosis not present

## 2022-11-13 DIAGNOSIS — H04123 Dry eye syndrome of bilateral lacrimal glands: Secondary | ICD-10-CM

## 2022-11-13 NOTE — Patient Instructions (Signed)
Systane preservative free eye drops for dry eyes

## 2022-11-13 NOTE — Progress Notes (Signed)
Subjective:     Patient ID: [name] [name], male    DOB: 1951-02-20, 72 y.o.   MRN: [id number]  Chief Complaint  Patient presents with   Diabetes    Wants to make sure monitor is working    HPI Patient is in today for assistance setting up continuous glucose monitoring.   Last A1c 7.0%     Health Maintenance Due  Topic Date Due   FOOT EXAM  Never done   OPHTHALMOLOGY EXAM  Never done   Diabetic kidney evaluation - Urine ACR  Never done   Medicare Annual Wellness (AWV)  05/14/2021   DTaP/Tdap/Td (2 - Td or Tdap) 07/11/2022    Past Medical History:  Diagnosis Date   Allergic rhinitis    Allergy    SEASONAL,USE TO TAKE ALLERGY SHOTS   Anxiety    Arthritis    BACK,KNEES   Asthma, mild    Borderline diabetes mellitus    Depression    Diverticulosis of colon    Gallstones    Generalized anxiety disorder    GERD (gastroesophageal reflux disease)    Hemorrhoids    History of colonic polyps    Hyperlipidemia    Hypertension    Lumbar back pain    Obesity    Venous insufficiency     Past Surgical History:  Procedure Laterality Date   COLONOSCOPY  2011   LUMBAR FUSION     LUMBAR LAMINECTOMY  [id number]   POLYPECTOMY      Family History  Problem Relation Age of Onset   Stroke Father    Heart attack Father    Colon cancer Father    Diabetes Father    Prostate cancer Father    Lung cancer Mother    Colon cancer Maternal Grandfather    Colon polyps Neg Hx    Esophageal cancer Neg Hx    Stomach cancer Neg Hx    Rectal cancer Neg Hx     Social History   Socioeconomic History   Marital status: Single    Spouse name: Not on file   Number of children: Not on file   Years of education: Not on file   Highest education level: Not on file  Occupational History   Occupation: warehouse work    Fish farm manager: DISABLED    Comment: [employer] and replacements   Occupation: disability    Comment: since 10/2003 due to his back  Tobacco Use   Smoking status: Never    Smokeless tobacco: Never  Vaping Use   Vaping Use: Never used  Substance and Sexual Activity   Alcohol use: No   Drug use: No   Sexual activity: Not on file  Other Topics Concern   Not on file  Social History Narrative   Not on file   Social Determinants of Health   Financial Resource Strain: Not on file  Food Insecurity: Not on file  Transportation Needs: Not on file  Physical Activity: Not on file  Stress: Not on file  Social Connections: Not on file  Intimate Partner Violence: Not on file    Outpatient Medications Prior to Visit  Medication Sig Dispense Refill   albuterol (VENTOLIN HFA) 108 (90 Base) MCG/ACT inhaler Inhale 2 puffs into the lungs every 6 (six) hours as needed for wheezing or shortness of breath. 1 each 0   ALPRAZolam (XANAX) 0.5 MG tablet Take 1 tablet (0.5 mg total) by mouth 3 (three) times daily as needed. for anxiety 90 tablet 2  amLODipine (NORVASC) 5 MG tablet Take 1 tablet (5 mg total) by mouth daily. 90 tablet 3   aspirin 81 MG tablet Take 81 mg by mouth daily.     Continuous Blood Gluc Receiver (FREESTYLE LIBRE 3 READER) DEVI 1 Device by Does not apply route daily. 1 each 0   Continuous Blood Gluc Sensor (FREESTYLE LIBRE 3 SENSOR) MISC Place 1 sensor on the skin every 14 days. Use to check glucose continuously E11.9 2 each 11   fluticasone (FLONASE) 50 MCG/ACT nasal spray Use 2 spray(s) in each nostril once daily 48 g 5   fluticasone-salmeterol (ADVAIR) 250-50 MCG/ACT AEPB Inhale 1 puff into the lungs in the morning and at bedtime. 180 each 3   gabapentin (NEURONTIN) 100 MG capsule Take 1 capsule (100 mg total) by mouth 3 (three) times daily. 90 capsule 5   losartan (COZAAR) 50 MG tablet Take 1 tablet (50 mg total) by mouth daily. 90 tablet 3   metFORMIN (GLUCOPHAGE-XR) 500 MG 24 hr tablet Take 1 tablet (500 mg total) by mouth daily with breakfast. 90 tablet 3   omeprazole (PRILOSEC) 20 MG capsule Take 2 capsules (40 mg total) by mouth daily. 180  capsule 3   Probiotic Product (PROBIOTIC PO) Take by mouth.     sennosides-docusate sodium (SENOKOT-S) 8.6-50 MG tablet Take 1 tablet by mouth daily.     VITAMIN B COMPLEX-C PO Take by mouth.     VITAMIN D PO Take by mouth.     cyclobenzaprine (FLEXERIL) 5 MG tablet Take 1 tablet (5 mg total) by mouth 3 (three) times daily as needed for muscle spasms. 90 tablet 2   loratadine (CLARITIN) 10 MG tablet Take 1 tablet (10 mg total) by mouth daily. 90 tablet 3   traMADol (ULTRAM) 50 MG tablet Take 1 tablet (50 mg total) by mouth every 6 (six) hours as needed. 60 tablet 2   triamcinolone (NASACORT) 55 MCG/ACT AERO nasal inhaler Place 2 sprays into the nose daily. 3 each 3   No facility-administered medications prior to visit.    Allergies  Allergen Reactions   Cephalexin Itching    ROS     Objective:    Physical Exam  BP 138/84 (BP Location: Left Arm, Patient Position: Sitting, Cuff Size: Large)   Pulse 65   Temp (!) 97.4 F (36.3 C) (Temporal)   Ht 6\' 2"  (1.88 m)   Wt 243 lb (110.2 kg)   SpO2 98%   BMI 31.20 kg/m  Wt Readings from Last 3 Encounters:  11/13/22 243 lb (110.2 kg)  09/11/22 241 lb (109.3 kg)  08/12/22 240 lb (108.9 kg)       Assessment & Plan:   Problem List Items Addressed This Visit       Endocrine   Diabetes ([place]) - Primary   Other Visit Diagnoses     Dry eyes           Counseling done and Freestyle Libre applied by [place], CMA.  Recommended OTC Systane preservative free and follow up with Dr. [name] and have regular eye exams.   I have discontinued Anderson Malta "Gene"'s loratadine, triamcinolone, cyclobenzaprine, and traMADol. I am also having him maintain his aspirin, sennosides-docusate sodium, fluticasone, VITAMIN B COMPLEX-C PO, VITAMIN D PO, Probiotic Product (PROBIOTIC PO), amLODipine, fluticasone-salmeterol, losartan, omeprazole, metFORMIN, ALPRAZolam, gabapentin, albuterol, FreeStyle Libre 3 Sensor, and [employer] 3 Reader.  No  orders of the defined types were placed in this encounter.

## 2022-11-24 ENCOUNTER — Other Ambulatory Visit: Payer: Self-pay | Admitting: Internal Medicine

## 2022-12-09 LAB — HM DIABETES EYE EXAM

## 2022-12-14 ENCOUNTER — Telehealth: Payer: Self-pay | Admitting: Internal Medicine

## 2022-12-14 MED ORDER — ALPRAZOLAM 0.5 MG PO TABS: 0.5000 mg | ORAL_TABLET | Freq: Three times a day (TID) | ORAL | 2 refills | Status: DC | PRN

## 2022-12-14 MED ORDER — OMEPRAZOLE 20 MG PO CPDR: 40.0000 mg | DELAYED_RELEASE_CAPSULE | Freq: Every day | ORAL | 3 refills | Status: DC

## 2022-12-14 NOTE — Telephone Encounter (Signed)
Done erx 

## 2022-12-14 NOTE — Telephone Encounter (Signed)
Prescription Request  12/14/2022  LOV: 09/11/2022  What is the name of the medication or equipment? Alprazolam and omeprazole  Have you contacted your pharmacy to request a refill? No   Which pharmacy would you like this sent to?  [facility] [address] [place], [place] - [address] [place] [address] Phone: [phone] Fax: [phone]   Patient notified that their request is being sent to the clinical staff for review and that they should receive a response within 2 business days.   Please advise at Mobile There is no such number on file (mobile).

## 2022-12-14 NOTE — Telephone Encounter (Signed)
See below

## 2023-01-06 ENCOUNTER — Other Ambulatory Visit: Payer: Self-pay

## 2023-01-06 MED ORDER — LOSARTAN POTASSIUM 50 MG PO TABS: 50.0000 mg | ORAL_TABLET | Freq: Every day | ORAL | 3 refills | Status: DC

## 2023-01-08 ENCOUNTER — Other Ambulatory Visit: Payer: Self-pay | Admitting: Internal Medicine

## 2023-02-11 ENCOUNTER — Ambulatory Visit (INDEPENDENT_AMBULATORY_CARE_PROVIDER_SITE_OTHER): Payer: PPO | Admitting: Internal Medicine

## 2023-02-11 ENCOUNTER — Ambulatory Visit (INDEPENDENT_AMBULATORY_CARE_PROVIDER_SITE_OTHER): Payer: PPO

## 2023-02-11 VITALS — BP 130/80 | HR 80 | Temp 99.0°F | Ht 74.0 in | Wt 231.0 lb

## 2023-02-11 DIAGNOSIS — I10 Essential (primary) hypertension: Secondary | ICD-10-CM | POA: Diagnosis not present

## 2023-02-11 DIAGNOSIS — E1165 Type 2 diabetes mellitus with hyperglycemia: Secondary | ICD-10-CM

## 2023-02-11 DIAGNOSIS — E538 Deficiency of other specified B group vitamins: Secondary | ICD-10-CM | POA: Diagnosis not present

## 2023-02-11 DIAGNOSIS — E559 Vitamin D deficiency, unspecified: Secondary | ICD-10-CM | POA: Diagnosis not present

## 2023-02-11 DIAGNOSIS — Z0001 Encounter for general adult medical examination with abnormal findings: Secondary | ICD-10-CM | POA: Diagnosis not present

## 2023-02-11 DIAGNOSIS — R1011 Right upper quadrant pain: Secondary | ICD-10-CM | POA: Diagnosis not present

## 2023-02-11 DIAGNOSIS — Z125 Encounter for screening for malignant neoplasm of prostate: Secondary | ICD-10-CM

## 2023-02-11 DIAGNOSIS — E785 Hyperlipidemia, unspecified: Secondary | ICD-10-CM | POA: Diagnosis not present

## 2023-02-11 DIAGNOSIS — R7303 Prediabetes: Secondary | ICD-10-CM

## 2023-02-11 DIAGNOSIS — E7800 Pure hypercholesterolemia, unspecified: Secondary | ICD-10-CM | POA: Diagnosis not present

## 2023-02-11 DIAGNOSIS — Z7984 Long term (current) use of oral hypoglycemic drugs: Secondary | ICD-10-CM

## 2023-02-11 DIAGNOSIS — G894 Chronic pain syndrome: Secondary | ICD-10-CM

## 2023-02-11 LAB — HEPATIC FUNCTION PANEL
ALT: 31 U/L (ref 0–53)
AST: 25 U/L (ref 0–37)
Albumin: 4.3 g/dL (ref 3.5–5.2)
Alkaline Phosphatase: 53 U/L (ref 39–117)
Bilirubin, Direct: 0.2 mg/dL (ref 0.0–0.3)
Total Bilirubin: 0.9 mg/dL (ref 0.2–1.2)
Total Protein: 6.8 g/dL (ref 6.0–8.3)

## 2023-02-11 LAB — URINALYSIS, ROUTINE W REFLEX MICROSCOPIC
Bilirubin Urine: NEGATIVE
Hgb urine dipstick: NEGATIVE
Ketones, ur: NEGATIVE
Leukocytes,Ua: NEGATIVE
Nitrite: NEGATIVE
Specific Gravity, Urine: <=1.005 — AB (ref 1.000–1.030)
Total Protein, Urine: NEGATIVE
Urine Glucose: NEGATIVE
Urobilinogen, UA: 0.2 (ref 0.0–1.0)
WBC, UA: NONE SEEN — AB (ref 0–?)
pH: 6 (ref 5.0–8.0)

## 2023-02-11 LAB — VITAMIN D 25 HYDROXY (VIT D DEFICIENCY, FRACTURES): VITD: 40.59 ng/mL (ref 30.00–100.00)

## 2023-02-11 LAB — MICROALBUMIN / CREATININE URINE RATIO
Creatinine,U: 38.1 mg/dL
Microalb Creat Ratio: 1.8 mg/g (ref 0.0–30.0)
Microalb, Ur: <0.7 mg/dL (ref 0.0–1.9)

## 2023-02-11 LAB — BASIC METABOLIC PANEL
BUN: 6 mg/dL (ref 6–23)
CO2: 29 mEq/L (ref 19–32)
Calcium: 9.4 mg/dL (ref 8.4–10.5)
Chloride: 99 mEq/L (ref 96–112)
Creatinine, Ser: 0.82 mg/dL (ref 0.40–1.50)
GFR: 88.48 mL/min (ref >60.00)
Glucose, Bld: 120 mg/dL — ABNORMAL HIGH (ref 70–99)
Potassium: 3.5 mEq/L (ref 3.5–5.1)
Sodium: 138 mEq/L (ref 135–145)

## 2023-02-11 LAB — CBC WITH DIFFERENTIAL/PLATELET
Basophils Absolute: 0.1 10*3/uL (ref 0.0–0.1)
Basophils Relative: 0.5 % (ref 0.0–3.0)
Eosinophils Absolute: 0.2 10*3/uL (ref 0.0–0.7)
Eosinophils Relative: 1.8 % (ref 0.0–5.0)
HCT: 48.1 % (ref 39.0–52.0)
Hemoglobin: 16.7 g/dL (ref 13.0–17.0)
Lymphocytes Relative: 16.1 % (ref 12.0–46.0)
Lymphs Abs: 1.5 10*3/uL (ref 0.7–4.0)
MCHC: 34.8 g/dL (ref 30.0–36.0)
MCV: 91.1 fl (ref 78.0–100.0)
Monocytes Absolute: 0.7 10*3/uL (ref 0.1–1.0)
Monocytes Relative: 7 % (ref 3.0–12.0)
Neutro Abs: 7.1 10*3/uL (ref 1.4–7.7)
Neutrophils Relative %: 74.6 % (ref 43.0–77.0)
Platelets: 291 10*3/uL (ref 150.0–400.0)
RBC: 5.28 Mil/uL (ref 4.22–5.81)
RDW: 12.8 % (ref 11.5–15.5)
WBC: 9.5 10*3/uL (ref 4.0–10.5)

## 2023-02-11 LAB — LDL CHOLESTEROL, DIRECT: Direct LDL: 53 mg/dL

## 2023-02-11 LAB — LIPID PANEL
Cholesterol: 116 mg/dL (ref 0–200)
HDL: 31.1 mg/dL — ABNORMAL LOW (ref >39.00)
NonHDL: 84.69
Total CHOL/HDL Ratio: 4
Triglycerides: 273 mg/dL — ABNORMAL HIGH (ref 0.0–149.0)
VLDL: 54.6 mg/dL — ABNORMAL HIGH (ref 0.0–40.0)

## 2023-02-11 LAB — PSA: PSA: 1.76 ng/mL (ref 0.10–4.00)

## 2023-02-11 LAB — VITAMIN B12: Vitamin B-12: 438 pg/mL (ref 211–911)

## 2023-02-11 LAB — TSH: TSH: 0.48 u[IU]/mL (ref 0.35–5.50)

## 2023-02-11 LAB — HEMOGLOBIN A1C: Hgb A1c MFr Bld: 6.3 % (ref 4.6–6.5)

## 2023-02-11 NOTE — Patient Instructions (Signed)
Please continue all other medications as before, and refills have been done if requested.  Please have the pharmacy call with any other refills you may need.  Please continue your efforts at being more active, low cholesterol diet, and weight control.  You are otherwise up to date with prevention measures today.  Please keep your appointments with your specialists as you may have planned - pain management  You will be contacted regarding the referral for: abdomen ultrasound  Please go to the XRAY Department in the first floor for the x-ray testing  You will be contacted by phone if any changes need to be made immediately.  Otherwise, you will receive a letter about your results with an explanation, but please check with MyChart first.  Please remember to sign up for MyChart if you have not done so, as this will be important to you in the future with finding out test results, communicating by private email, and scheduling acute appointments online when needed.  Please make an Appointment to return in 6 months, or sooner if needed

## 2023-02-11 NOTE — Progress Notes (Signed)
Patient ID: [name] [name], male   DOB: 1951-08-31, 72 y.o.   MRN: [id number]         Chief Complaint:: wellness exam and ruq pain, dish, thoracic radiculitis, right cervical radiculitis, hld, htn, dm, low vit d, low b12, preDM       HPI:  [name] [name] is a 72 y.o. male here for wellness exam; declines covid booster, for tdap at pharmacy, o/w up to date                        Also seeing pain management with chronic back pain, with prior t spine mri c/w DISH and multiple areas of potential radiculitis; pt also today with recent worsening right lower cervical pain with radiation to the distal RUE; also has mild to mod RUQ pain with pain to right side and mid right back not clear if related intermittent for several wks.  Pt denies chest pain, increased sob or doe, wheezing, orthopnea, PND, increased LE swelling, palpitations, dizziness or syncope.   Pt denies polydipsia, polyuria, or new focal neuro s/s.   Pt denies fever, wt loss, night sweats, loss of appetite, or other constitutional symptoms     Wt Readings from Last 3 Encounters:  02/11/23 231 lb (104.8 kg)  11/13/22 243 lb (110.2 kg)  09/11/22 241 lb (109.3 kg)   BP Readings from Last 3 Encounters:  02/11/23 130/80  11/13/22 138/84  09/11/22 120/64   Immunization History  Administered Date(s) Administered   Fluad Quad(high Dose 65+) 05/12/2019, 05/14/2020, 05/16/2021   Influenza Split 05/18/2011, 05/30/2012, 07/11/2012   Influenza Whole 06/06/2008, 05/29/2009, 06/02/2010   Influenza, High Dose Seasonal PF 05/31/2017, 06/01/2018   Influenza,inj,Quad PF,6+ Mos 05/24/2013, 05/31/2014, 06/07/2015, 05/19/2016   Influenza,inj,quad, With Preservative 05/31/2017   Influenza-Unspecified 08/03/2022   PFIZER(Purple Top)SARS-COV-2 Vaccination 11/20/2019, 12/11/2019, 06/15/2020, 12/09/2020   Pfizer Covid-19 Vaccine Bivalent Booster 5y-11y 05/27/2021   Pneumococcal Conjugate-13 01/06/2017   Pneumococcal Polysaccharide-23 06/01/2018    Pneumococcal-Unspecified 08/31/2016   Tdap 07/11/2012   Zoster Recombinat (Shingrix) 11/03/2021, 01/05/2022   Zoster, Live 01/07/2012   Health Maintenance Due  Topic Date Due   Medicare Annual Wellness (AWV)  05/14/2021   COVID-19 Vaccine (6 - 2023-24 season) 05/01/2022   DTaP/Tdap/Td (2 - Td or Tdap) 07/11/2022      Past Medical History:  Diagnosis Date   Allergic rhinitis    Allergy    SEASONAL,USE TO TAKE ALLERGY SHOTS   Anxiety    Arthritis    BACK,KNEES   Asthma, mild    Borderline diabetes mellitus    Depression    Diverticulosis of colon    Gallstones    Generalized anxiety disorder    GERD (gastroesophageal reflux disease)    Hemorrhoids    History of colonic polyps    Hyperlipidemia    Hypertension    Lumbar back pain    Obesity    Venous insufficiency    Past Surgical History:  Procedure Laterality Date   COLONOSCOPY  2011   LUMBAR FUSION     LUMBAR LAMINECTOMY  [id number]   POLYPECTOMY      reports that he has never smoked. He has never used smokeless tobacco. He reports that he does not drink alcohol and does not use drugs. family history includes Colon cancer in his father and maternal grandfather; Diabetes in his father; Heart attack in his father; Lung cancer in his mother; Prostate cancer in his father; Stroke in his father. Allergies  Allergen Reactions   Cephalexin Itching   Current Outpatient Medications on File Prior to Visit  Medication Sig Dispense Refill   albuterol (VENTOLIN HFA) 108 (90 Base) MCG/ACT inhaler Inhale 2 puffs into the lungs every 6 (six) hours as needed for wheezing or shortness of breath. 1 each 0   ALPRAZolam (XANAX) 0.5 MG tablet Take 1 tablet (0.5 mg total) by mouth 3 (three) times daily as needed. for anxiety 90 tablet 2   amLODipine (NORVASC) 5 MG tablet Take 1 tablet by mouth once daily 90 tablet 1   aspirin 81 MG tablet Take 81 mg by mouth daily.     Continuous Blood Gluc Receiver (FREESTYLE LIBRE 3 READER) DEVI 1  Device by Does not apply route daily. 1 each 0   Continuous Blood Gluc Sensor (FREESTYLE LIBRE 3 SENSOR) MISC Place 1 sensor on the skin every 14 days. Use to check glucose continuously E11.9 2 each 11   fluticasone (FLONASE) 50 MCG/ACT nasal spray Use 2 spray(s) in each nostril once daily 48 g 5   fluticasone-salmeterol (ADVAIR) 250-50 MCG/ACT AEPB Inhale 1 puff into the lungs in the morning and at bedtime. 180 each 3   losartan (COZAAR) 50 MG tablet Take 1 tablet (50 mg total) by mouth daily. 90 tablet 3   metFORMIN (GLUCOPHAGE-XR) 500 MG 24 hr tablet Take 1 tablet (500 mg total) by mouth daily with breakfast. 90 tablet 3   omeprazole (PRILOSEC) 20 MG capsule Take 2 capsules (40 mg total) by mouth daily. 180 capsule 3   Probiotic Product (PROBIOTIC PO) Take by mouth.     sennosides-docusate sodium (SENOKOT-S) 8.6-50 MG tablet Take 1 tablet by mouth daily.     VITAMIN B COMPLEX-C PO Take by mouth.     VITAMIN D PO Take by mouth.     gabapentin (NEURONTIN) 100 MG capsule Take 1 capsule (100 mg total) by mouth 3 (three) times daily. (Patient not taking: Reported on 02/11/2023) 90 capsule 5   No current facility-administered medications on file prior to visit.        ROS:  All others reviewed and negative.  Objective        PE:  BP 130/80 (BP Location: Left Arm, Patient Position: Sitting, Cuff Size: Normal)   Pulse 80   Temp 99 F (37.2 C) (Oral)   Ht 6\' 2"  (1.88 m)   Wt 231 lb (104.8 kg)   SpO2 97%   BMI 29.66 kg/m                 Constitutional: Pt appears in NAD               HENT: Head: NCAT.                Right Ear: External ear normal.                 Left Ear: External ear normal.                Eyes: . Pupils are equal, round, and reactive to light. Conjunctivae and EOM are normal               Nose: without d/c or deformity               Neck: Neck supple. Gross normal ROM               Cardiovascular: Normal rate and regular rhythm.  Pulmonary/Chest: Effort  normal and breath sounds without rales or wheezing.                Abd:  Soft, NT, ND, + BS, no organomegaly               Neurological: Pt is alert. At baseline orientation, motor grossly intact               Skin: Skin is warm. No rashes, no other new lesions, LE edema - none               Psychiatric: Pt behavior is normal without agitation   Micro: none  Cardiac tracings I have personally interpreted today:  none  Pertinent Radiological findings (summarize): none   Lab Results  Component Value Date   WBC 9.5 02/11/2023   HGB 16.7 02/11/2023   HCT 48.1 02/11/2023   PLT 291.0 02/11/2023   GLUCOSE 120 (H) 02/11/2023   CHOL 116 02/11/2023   TRIG 273.0 (H) 02/11/2023   HDL 31.10 (L) 02/11/2023   LDLDIRECT 53.0 02/11/2023   LDLCALC 42 05/14/2021   ALT 31 02/11/2023   AST 25 02/11/2023   NA 138 02/11/2023   K 3.5 02/11/2023   CL 99 02/11/2023   CREATININE 0.82 02/11/2023   BUN 6 02/11/2023   CO2 29 02/11/2023   TSH 0.48 02/11/2023   PSA 1.76 02/11/2023   INR 1.20 12/06/2015   HGBA1C 6.3 02/11/2023   MICROALBUR <0.7 02/11/2023   Assessment/Plan:  [name] [name] is a 71 y.o. White or Caucasian [1] male with  has a past medical history of Allergic rhinitis, Allergy, Anxiety, Arthritis, Asthma, mild, Borderline diabetes mellitus, Depression, Diverticulosis of colon, Gallstones, Generalized anxiety disorder, GERD (gastroesophageal reflux disease), Hemorrhoids, History of colonic polyps, Hyperlipidemia, Hypertension, Lumbar back pain, Obesity, and Venous insufficiency.  Encounter for well adult exam with abnormal findings Age and sex appropriate education and counseling updated with regular exercise and diet Referrals for preventative services - none needed Immunizations addressed - declines covid booster, for tdap at pharmacy Smoking counseling  - none needed Evidence for depression or other mood disorder - none significant Most recent labs reviewed. I have personally  reviewed and have noted: 1) the patient's medical and social history 2) The patient's current medications and supplements 3) The patient's height, weight, and BMI have been recorded in the chart   Hyperlipidemia Lab Results  Component Value Date   LDLCALC 42 05/14/2021   Stable, pt to continue low chol diet, declines low dose statin   Essential hypertension BP Readings from Last 3 Encounters:  02/11/23 130/80  11/13/22 138/84  09/11/22 120/64   Stable, pt to continue medical treatment norvasc 5 mg every day, losartan 50 mg qd   Diabetes ([facility]) Lab Results  Component Value Date   HGBA1C 6.3 02/11/2023   Stable, pt to continue current medical treatment metformin ER 500 mg - 1 qd   Vitamin D deficiency Last vitamin D Lab Results  Component Value Date   VD25OH 40.59 02/11/2023   Stable, cont oral replacement   B12 deficiency Lab Results  Component Value Date   VITAMINB12 438 02/11/2023   Stable, cont oral replacement - b12 1000 mcg qd   RUQ pain Etiology unclear, for cxr, also abd [place]  Chronic pain syndrome Pt with hx of DISH, t spine radiculitis pain, and more recently likely c spine radicular pain as well - pt encouraged to followup with pain management  Followup: Return in about  6 months (around 08/13/2023).  Oliver Barre, MD 02/13/2023 3:00 PM [facility]

## 2023-02-11 NOTE — Progress Notes (Signed)
The test results show that your current treatment is OK, as the tests are stable.  Please continue the same plan.  There is no other need for change of treatment or further evaluation based on these results, at this time.  thanks 

## 2023-02-13 ENCOUNTER — Encounter: Payer: Self-pay | Admitting: Internal Medicine

## 2023-02-13 DIAGNOSIS — R7303 Prediabetes: Secondary | ICD-10-CM | POA: Insufficient documentation

## 2023-02-13 NOTE — Assessment & Plan Note (Signed)
Last vitamin D Lab Results  Component Value Date   VD25OH 40.59 02/11/2023   Stable, cont oral replacement

## 2023-02-13 NOTE — Assessment & Plan Note (Signed)
Pt with hx of DISH, t spine radiculitis pain, and more recently likely c spine radicular pain as well - pt encouraged to followup with pain management

## 2023-02-13 NOTE — Assessment & Plan Note (Signed)
Lab Results  Component Value Date   VITAMINB12 438 02/11/2023   Stable, cont oral replacement - b12 1000 mcg qd

## 2023-02-13 NOTE — Assessment & Plan Note (Signed)
Lab Results  Component Value Date   HGBA1C 6.3 02/11/2023   Stable, pt to continue current medical treatment metformin ER 500 mg - 1 qd

## 2023-02-13 NOTE — Assessment & Plan Note (Signed)
Lab Results  Component Value Date   LDLCALC 42 05/14/2021   Stable, pt to continue low chol diet, declines low dose statin

## 2023-02-13 NOTE — Assessment & Plan Note (Signed)
Age and sex appropriate education and counseling updated with regular exercise and diet Referrals for preventative services - none needed Immunizations addressed - declines covid booster, for tdap at pharmacy Smoking counseling  - none needed Evidence for depression or other mood disorder - none significant Most recent labs reviewed. I have personally reviewed and have noted: 1) the patient's medical and social history 2) The patient's current medications and supplements 3) The patient's height, weight, and BMI have been recorded in the chart  

## 2023-02-13 NOTE — Assessment & Plan Note (Signed)
Etiology unclear, for cxr, also abd [place]

## 2023-02-13 NOTE — Assessment & Plan Note (Signed)
BP Readings from Last 3 Encounters:  02/11/23 130/80  11/13/22 138/84  09/11/22 120/64   Stable, pt to continue medical treatment norvasc 5 mg every day, losartan 50 mg qd

## 2023-02-15 ENCOUNTER — Telehealth: Payer: Self-pay | Admitting: Internal Medicine

## 2023-02-15 NOTE — Telephone Encounter (Signed)
Pt wants a called back to go over his Xray results. Please advise.

## 2023-02-16 NOTE — Telephone Encounter (Signed)
Has not been resulted yet  

## 2023-02-18 NOTE — Telephone Encounter (Signed)
Triglycerides not severe enough to need further medication.  Pt should work on weight control and low fat diet, thanks

## 2023-02-18 NOTE — Telephone Encounter (Signed)
Called pt gave MD response on cxr. Pt is also wanting to say his Triglycerides has always ran high,, MD never address it but want to know is there some type of pill to help Triglycerides./lmb

## 2023-02-18 NOTE — Telephone Encounter (Signed)
6/13 cxr is normal (negative for any acute problem)    thanks

## 2023-02-18 NOTE — Telephone Encounter (Signed)
Xray in epic.Marland KitchenRaechel Chute

## 2023-02-19 NOTE — Telephone Encounter (Signed)
Called pt and relayed MD response, pt verbalized understanding

## 2023-03-11 ENCOUNTER — Ambulatory Visit
Admission: RE | Admit: 2023-03-11 | Discharge: 2023-03-11 | Disposition: A | Discharge status: Home / Self Care | Payer: PPO | Source: Ambulatory Visit | Attending: Internal Medicine | Admitting: Internal Medicine

## 2023-03-11 DIAGNOSIS — R1011 Right upper quadrant pain: Secondary | ICD-10-CM

## 2023-03-15 ENCOUNTER — Other Ambulatory Visit: Payer: Self-pay | Admitting: Internal Medicine

## 2023-03-17 LAB — HM DIABETES EYE EXAM

## 2023-03-23 ENCOUNTER — Encounter: Payer: Self-pay | Admitting: Internal Medicine

## 2023-04-21 ENCOUNTER — Telehealth: Payer: Self-pay | Admitting: Radiology

## 2023-04-21 DIAGNOSIS — R202 Paresthesia of skin: Secondary | ICD-10-CM

## 2023-04-21 NOTE — Telephone Encounter (Signed)
See below , thanks

## 2023-04-21 NOTE — Telephone Encounter (Signed)
Ok we should refer to Neurology whom he has not yet seen   I will do this, hopefully he will hear soon, thanks

## 2023-04-22 NOTE — Telephone Encounter (Signed)
LVM for pt regarding his concerns and a referral was place, He will soon be contacted by neurology to make an appointment.

## 2023-04-30 ENCOUNTER — Telehealth: Payer: Self-pay

## 2023-04-30 NOTE — Telephone Encounter (Signed)
Called and left voicemail letting Pt know he needs to come in for an OV in order for neurology to see him, if Pt calls back please schedule an appt. Referral for Neurology.

## 2023-05-13 ENCOUNTER — Ambulatory Visit (INDEPENDENT_AMBULATORY_CARE_PROVIDER_SITE_OTHER): Payer: PPO | Admitting: Nurse Practitioner

## 2023-05-13 ENCOUNTER — Encounter: Payer: Self-pay | Admitting: Nurse Practitioner

## 2023-05-13 VITALS — BP 148/82 | HR 74 | Temp 97.2°F | Wt 230.2 lb

## 2023-05-13 DIAGNOSIS — I872 Venous insufficiency (chronic) (peripheral): Secondary | ICD-10-CM

## 2023-05-13 DIAGNOSIS — E1141 Type 2 diabetes mellitus with diabetic mononeuropathy: Secondary | ICD-10-CM | POA: Diagnosis not present

## 2023-05-13 MED ORDER — GABAPENTIN 100 MG PO CAPS
ORAL_CAPSULE | ORAL | 0 refills | Status: DC
Start: 2023-05-13 — End: 2023-07-21

## 2023-05-13 NOTE — Progress Notes (Addendum)
Established Patient Visit  Patient: [name] [name]   DOB: [birth year]   72 y.o. Male  MRN: [id number] Visit Date: 05/13/2023  Subjective:    Chief Complaint  Patient presents with   Cold Extremity    Bilateral feet    Edema    Right foot    HPI Diabetic mononeuropathy associated with type 2 diabetes mellitus (HCC) Chronic ankle swelling, numbness and cold sensation. Unsteady gait due to numbness in feet Arterial duplex completed 2022: normal Venous doppler 2019: negative DVT. B12: normal Denies any SOB or PND or claudication No Tobacco or ALCOHOL use. Last hgbA1c at 6.3% Reports he discontinued gabapentin 6months ago because he thought it was cause of unsteady gait and falls. States gait has not improved with discontinuation of gabapentin.  Advised to resume gabapentin. Provided titration instructions. PRESCRIPTION sent Advised to maintain a DASH diet and use of compression stocking. F/up with pcp  Reviewed medical, surgical, and social history today  Medications: Outpatient Medications Prior to Visit  Medication Sig   albuterol (VENTOLIN HFA) 108 (90 Base) MCG/ACT inhaler Inhale 2 puffs into the lungs every 6 (six) hours as needed for wheezing or shortness of breath.   ALPRAZolam (XANAX) 0.5 MG tablet TAKE 1 TABLET BY MOUTH THREE TIMES DAILY AS NEEDED FOR ANXIETY   amLODipine (NORVASC) 5 MG tablet Take 1 tablet by mouth once daily   aspirin 81 MG tablet Take 81 mg by mouth daily.   Continuous Blood Gluc Receiver (FREESTYLE LIBRE 3 READER) DEVI 1 Device by Does not apply route daily.   Continuous Blood Gluc Sensor (FREESTYLE LIBRE 3 SENSOR) MISC Place 1 sensor on the skin every 14 days. Use to check glucose continuously E11.9   fluticasone (FLONASE) 50 MCG/ACT nasal spray Use 2 spray(s) in each nostril once daily   fluticasone-salmeterol (ADVAIR) 250-50 MCG/ACT AEPB Inhale 1 puff into the lungs in the morning and at bedtime.   losartan (COZAAR) 50 MG tablet Take  1 tablet (50 mg total) by mouth daily.   metFORMIN (GLUCOPHAGE-XR) 500 MG 24 hr tablet Take 1 tablet (500 mg total) by mouth daily with breakfast.   omeprazole (PRILOSEC) 20 MG capsule Take 2 capsules (40 mg total) by mouth daily.   Probiotic Product (PROBIOTIC PO) Take by mouth.   sennosides-docusate sodium (SENOKOT-S) 8.6-50 MG tablet Take 1 tablet by mouth daily.   VITAMIN B COMPLEX-C PO Take by mouth.   VITAMIN D PO Take by mouth.   [DISCONTINUED] gabapentin (NEURONTIN) 100 MG capsule Take 1 capsule (100 mg total) by mouth 3 (three) times daily. (Patient not taking: Reported on 02/11/2023)   No facility-administered medications prior to visit.   Reviewed past medical and social history.   ROS per HPI above  Last metabolic panel Lab Results  Component Value Date   GLUCOSE 120 (H) 02/11/2023   NA 138 02/11/2023   K 3.5 02/11/2023   CL 99 02/11/2023   CO2 29 02/11/2023   BUN 6 02/11/2023   CREATININE 0.82 02/11/2023   GFR 88.48 02/11/2023   CALCIUM 9.4 02/11/2023   PROT 6.8 02/11/2023   ALBUMIN 4.3 02/11/2023   BILITOT 0.9 02/11/2023   ALKPHOS 53 02/11/2023   AST 25 02/11/2023   ALT 31 02/11/2023   ANIONGAP 8 01/26/2022   Last hemoglobin A1c Lab Results  Component Value Date   HGBA1C 6.3 02/11/2023   Last thyroid functions Lab Results  Component Value  Date   TSH 0.48 02/11/2023        Objective:  BP (!) 148/82   Pulse 74   Temp (!) 97.2 F (36.2 C) (Temporal)   Wt 230 lb 3.2 oz (104.4 kg)   SpO2 98%   BMI 29.56 kg/m      Physical Exam Vitals and nursing note reviewed.  Cardiovascular:     Pulses:          Dorsalis pedis pulses are 1+ on the right side and 1+ on the left side.       Posterior tibial pulses are 0 on the right side and 0 on the left side.     Comments: Non pitting edema Pulmonary:     Effort: Pulmonary effort is normal.  Musculoskeletal:        General: No tenderness.     Right lower leg: No edema.     Left lower leg: Edema present.   Skin:    Findings: No bruising, erythema, lesion or rash.  Neurological:     Mental Status: He is alert and oriented to person, place, and time.     No results found for any visits on 05/13/23.    Assessment & Plan:    Problem List Items Addressed This Visit     Diabetic mononeuropathy associated with type 2 diabetes mellitus (HCC) - Primary    Chronic ankle swelling, numbness and cold sensation. Unsteady gait due to numbness in feet Arterial duplex completed 2022: normal Venous doppler 2019: negative DVT. B12: normal Denies any SOB or PND or claudication No Tobacco or ALCOHOL use. Last hgbA1c at 6.3% Reports he discontinued gabapentin 6months ago because he thought it was cause of unsteady gait and falls. States gait has not improved with discontinuation of gabapentin.  Advised to resume gabapentin. Provided titration instructions. PRESCRIPTION sent Advised to maintain a DASH diet and use of compression stocking. F/up with pcp      Relevant Medications   gabapentin (NEURONTIN) 100 MG capsule   Venous (peripheral) insufficiency   Return if symptoms worsen or fail to improve.     Alysia Penna, NP

## 2023-05-13 NOTE — Assessment & Plan Note (Addendum)
Chronic ankle swelling, numbness and cold sensation. Unsteady gait due to numbness in feet Arterial duplex completed 2022: normal Venous doppler 2019: negative DVT. B12: normal Denies any SOB or PND or claudication No Tobacco or ALCOHOL use. Last hgbA1c at 6.3% Reports he discontinued gabapentin 6months ago because he thought it was cause of unsteady gait and falls. States gait has not improved with discontinuation of gabapentin.  Advised to resume gabapentin. Provided titration instructions. PRESCRIPTION sent Advised to maintain a DASH diet and use of compression stocking. F/up with pcp

## 2023-05-13 NOTE — Patient Instructions (Addendum)
Resume gabapentin 100mg  in PM x 1week, then 100mg  BID x1week, then 100mg  TID. This will help with neuropathy. Wear compression stocking during the day and off at night. Maintain low salt diet This will help with ankle edema.  DASH Eating Plan DASH stands for Dietary Approaches to Stop Hypertension. The DASH eating plan is a healthy eating plan that has been shown to: Lower high blood pressure (hypertension). Reduce your risk for type 2 diabetes, heart disease, and stroke. Help with weight loss. What are tips for following this plan? Reading food labels Check food labels for the amount of salt (sodium) per serving. Choose foods with less than 5 percent of the Daily Value (DV) of sodium. In general, foods with less than 300 milligrams (mg) of sodium per serving fit into this eating plan. To find whole grains, look for the word "whole" as the first word in the ingredient list. Shopping Buy products labeled as "low-sodium" or "no salt added." Buy fresh foods. Avoid canned foods and pre-made or frozen meals. Cooking Try not to add salt when you cook. Use salt-free seasonings or herbs instead of table salt or sea salt. Check with your health care provider or pharmacist before using salt substitutes. Do not fry foods. Cook foods in healthy ways, such as baking, boiling, grilling, roasting, or broiling. Cook using oils that are good for your heart. These include olive, canola, avocado, soybean, and sunflower oil. Meal planning  Eat a balanced diet. This should include: 4 or more servings of fruits and 4 or more servings of vegetables each day. Try to fill half of your plate with fruits and vegetables. 6-8 servings of whole grains each day. 6 or less servings of lean meat, poultry, or fish each day. 1 oz is 1 serving. A 3 oz (85 g) serving of meat is about the same size as the palm of your hand. One egg is 1 oz (28 g). 2-3 servings of low-fat dairy each day. One serving is 1 cup (237 mL). 1  serving of nuts, seeds, or beans 5 times each week. 2-3 servings of heart-healthy fats. Healthy fats called omega-3 fatty acids are found in foods such as walnuts, flaxseeds, fortified milks, and eggs. These fats are also found in cold-water fish, such as sardines, salmon, and mackerel. Limit how much you eat of: Canned or prepackaged foods. Food that is high in trans fat, such as fried foods. Food that is high in saturated fat, such as fatty meat. Desserts and other sweets, sugary drinks, and other foods with added sugar. Full-fat dairy products. Do not salt foods before eating. Do not eat more than 4 egg yolks a week. Try to eat at least 2 vegetarian meals a week. Eat more home-cooked food and less restaurant, buffet, and fast food. Lifestyle When eating at a restaurant, ask if your food can be made with less salt or no salt. If you drink alcohol: Limit how much you have to: 0-1 drink a day if you are male. 0-2 drinks a day if you are male. Know how much alcohol is in your drink. In the [place] one drink is one 12 oz bottle of beer (355 mL), one 5 oz glass of wine (148 mL), or one 1 oz glass of hard liquor (44 mL). General information Avoid eating more than 2,300 mg of salt a day. If you have hypertension, you may need to reduce your sodium intake to 1,500 mg a day. Work with your provider to stay at a healthy  body weight or lose weight. Ask what the best weight range is for you. On most days of the week, get at least 30 minutes of exercise that causes your heart to beat faster. This may include walking, swimming, or biking. Work with your provider or dietitian to adjust your eating plan to meet your specific calorie needs. What foods should I eat? Fruits All fresh, dried, or frozen fruit. Canned fruits that are in their natural juice and do not have sugar added to them. Vegetables Fresh or frozen vegetables that are raw, steamed, roasted, or grilled. Low-sodium or reduced-sodium  tomato and vegetable juice. Low-sodium or reduced-sodium tomato sauce and tomato paste. Low-sodium or reduced-sodium canned vegetables. Grains Whole-grain or whole-wheat bread. Whole-grain or whole-wheat pasta. Brown rice. Orpah Cobb. Bulgur. Whole-grain and low-sodium cereals. Pita bread. Low-fat, low-sodium crackers. Whole-wheat flour tortillas. Meats and other proteins Skinless chicken or [place]. Ground chicken or [place]. Pork with fat trimmed off. Fish and seafood. Egg whites. Dried beans, peas, or lentils. Unsalted nuts, nut butters, and seeds. Unsalted canned beans. Lean cuts of beef with fat trimmed off. Low-sodium, lean precooked or cured meat, such as sausages or meat loaves. Dairy Low-fat (1%) or fat-free (skim) milk. Reduced-fat, low-fat, or fat-free cheeses. Nonfat, low-sodium ricotta or cottage cheese. Low-fat or nonfat yogurt. Low-fat, low-sodium cheese. Fats and oils Soft margarine without trans fats. Vegetable oil. Reduced-fat, low-fat, or light mayonnaise and salad dressings (reduced-sodium). Canola, safflower, olive, avocado, soybean, and sunflower oils. Avocado. Seasonings and condiments Herbs. Spices. Seasoning mixes without salt. Other foods Unsalted popcorn and pretzels. Fat-free sweets. The items listed above may not be all the foods and drinks you can have. Talk to a dietitian to learn more. What foods should I avoid? Fruits Canned fruit in a light or heavy syrup. Fried fruit. Fruit in cream or butter sauce. Vegetables Creamed or fried vegetables. Vegetables in a cheese sauce. Regular canned vegetables that are not marked as low-sodium or reduced-sodium. Regular canned tomato sauce and paste that are not marked as low-sodium or reduced-sodium. Regular tomato and vegetable juices that are not marked as low-sodium or reduced-sodium. Rosita Fire. Olives. Grains Baked goods made with fat, such as croissants, muffins, or some breads. Dry pasta or rice meal packs. Meats and  other proteins Fatty cuts of meat. Ribs. Fried meat. Tomasa Blase. Bologna, salami, and other precooked or cured meats, such as sausages or meat loaves, that are not lean and low in sodium. Fat from the back of a pig (fatback). Bratwurst. Salted nuts and seeds. Canned beans with added salt. Canned or smoked fish. Whole eggs or egg yolks. Chicken or [place] with skin. Dairy Whole or 2% milk, cream, and half-and-half. Whole or full-fat cream cheese. Whole-fat or sweetened yogurt. Full-fat cheese. Nondairy creamers. Whipped toppings. Processed cheese and cheese spreads. Fats and oils Butter. Stick margarine. Lard. Shortening. Ghee. Bacon fat. Tropical oils, such as coconut, palm kernel, or palm oil. Seasonings and condiments Onion salt, garlic salt, seasoned salt, table salt, and sea salt. Worcestershire sauce. Tartar sauce. Barbecue sauce. Teriyaki sauce. Soy sauce, including reduced-sodium soy sauce. Steak sauce. Canned and packaged gravies. Fish sauce. Oyster sauce. Cocktail sauce. Store-bought horseradish. Ketchup. Mustard. Meat flavorings and tenderizers. Bouillon cubes. Hot sauces. Pre-made or packaged marinades. Pre-made or packaged taco seasonings. Relishes. Regular salad dressings. Other foods Salted popcorn and pretzels. The items listed above may not be all the foods and drinks you should avoid. Talk to a dietitian to learn more. Where to find more information National Heart,  Lung, and Blood Institute (NHLBI): BuffaloDryCleaner.gl American Heart Association (AHA): heart.org Academy of Nutrition and Dietetics: eatright.org National Kidney Foundation (NKF): kidney.org This information is not intended to replace advice given to you by your health care provider. Make sure you discuss any questions you have with your health care provider. Document Revised: 09/03/2022 Document Reviewed: 09/03/2022 Elsevier Patient Education  2024 Elsevier Inc.   Foot Care, Adult It is important to take good care of your  feet. The best way to prevent foot problems is to find and deal with any issues early. Older adults and those with long-term (chronic) conditions, such as diabetes, are more likely to have foot problems. Supplies needed: Warm water. Mild soap. Clean towel. Nail clippers. Shea Evans board or [employer]. Unscented moisturizing lotion or petroleum jelly. Mirror. How to care for your feet Foot hygiene Wash your feet every day. To do so: Use warm water and mild soap. Do not use hot water. Use a clean towel to dry your feet and between your toes. Do not soak your feet. This can dry your skin. Clip your toenails straight across. Toenails are easier to cut after bathing. Do not dig under the nails or around the cuticle. File the edges of your nails with an emery board or nail file. Put unscented moisturizing lotion or petroleum jelly on your feet and dry toenails. Do not use petroleum jelly that has alcohol added to it. Do not put lotion between your toes. Shoes and socks Wear clean socks or stockings every day. Make sure they are not too tight. Wear shoes that fit and have enough of a cushion to them. To break in new shoes, wear them for just a few hours a day. Always look in your shoes before you put them on. Make sure there are no objects inside. Wounds, scrapes, corns, and calluses  Check your feet every day for blisters, cuts, and redness. If you cannot see the bottom of your feet, use a mirror or ask someone for help. Do not cut corns or calluses. Do not try to remove them with medicine. If you find a minor scrape, cut, or break in the skin on your feet, keep the area clean and dry. Clean the area with mild soap and water. Do not clean the area with peroxide, alcohol, or iodine. If you have a wound, scrape, corn, or callus on your foot, look at it a few times a day to make sure it is healing and not infected. Check for: More redness, swelling, or pain. Fluid or blood. Warmth. Pus or a bad  smell. General instructions Do not cross your legs. Crossing your legs may decrease the blood flow to your feet. Do not use heating pads or hot water bottles on your feet. They may burn your skin. If you have lost feeling in your feet or legs, you may not know your skin is burning until it is too late. Make sure your health care provider does a full foot exam at least once a year or more often if you have foot problems. If you have foot problems, tell your provider about any cuts, sores, or bruises right away. Where to find more information Centers for Disease Control and Prevention (CDC): TonerPromos.no Contact a health care provider if: You have a condition that makes you more likely to get an infection, and you have any cuts, sores, or bruises on your feet. You have a wound or injury that is not healing. You have any signs of infection.  You feel burning or tingling in your legs or feet. You have pain or cramps in your legs or feet. Your feet always feel cold. You have pain around a toenail. Get help right away if: You have a red streak going up your leg. Your legs or feet are numb. This information is not intended to replace advice given to you by your health care provider. Make sure you discuss any questions you have with your health care provider. Document Revised: 09/07/2022 Document Reviewed: 07/16/2022 Elsevier Patient Education  2024 [employer].

## 2023-05-31 ENCOUNTER — Other Ambulatory Visit: Payer: Self-pay

## 2023-05-31 ENCOUNTER — Telehealth: Payer: Self-pay | Admitting: Internal Medicine

## 2023-05-31 MED ORDER — AMLODIPINE BESYLATE 5 MG PO TABS: 5.0000 mg | ORAL_TABLET | Freq: Every day | ORAL | 1 refills | Status: DC

## 2023-05-31 NOTE — Telephone Encounter (Signed)
Prescription Request  Patient states he is completely out of this medication  05/31/2023  LOV: 02/11/2023  What is the name of the medication or equipment? amlodopine  Have you contacted your pharmacy to request a refill? Yes   Which pharmacy would you like this sent to?  [facility] [address] [place], [place] - [address] [place] [address] Phone: [phone] Fax: [phone]   Patient notified that their request is being sent to the clinical staff for review and that they should receive a response within 2 business days.   Please advise at Mobile There is no such number on file (mobile).

## 2023-05-31 NOTE — Telephone Encounter (Signed)
Refill sent.

## 2023-06-11 ENCOUNTER — Telehealth: Payer: Self-pay | Admitting: Internal Medicine

## 2023-06-11 MED ORDER — ALPRAZOLAM 0.5 MG PO TABS: 0.5000 mg | ORAL_TABLET | Freq: Three times a day (TID) | ORAL | 2 refills | Status: DC | PRN

## 2023-06-11 NOTE — Telephone Encounter (Signed)
Done erx 

## 2023-06-11 NOTE — Telephone Encounter (Signed)
Patient said he will run out of medication over the weekend and would like to know if the refill can be called in by the end of today so he won't go without.   Prescription Request  06/11/2023  LOV: 02/11/2023  What is the name of the medication or equipment? ALPRAZolam (XANAX) 0.5 MG tablet   Have you contacted your pharmacy to request a refill? Yes   Which pharmacy would you like this sent to?  [facility] [address] [place], [place] - [address] [place] [address] Phone: [phone] Fax: [phone]    Patient notified that their request is being sent to the clinical staff for review and that they should receive a response within 2 business days.   Please advise at [facility] [phone]

## 2023-07-20 ENCOUNTER — Other Ambulatory Visit: Payer: Self-pay | Admitting: Internal Medicine

## 2023-07-20 DIAGNOSIS — E1141 Type 2 diabetes mellitus with diabetic mononeuropathy: Secondary | ICD-10-CM

## 2023-07-21 ENCOUNTER — Telehealth: Payer: Self-pay | Admitting: Internal Medicine

## 2023-07-21 DIAGNOSIS — E1141 Type 2 diabetes mellitus with diabetic mononeuropathy: Secondary | ICD-10-CM

## 2023-07-21 MED ORDER — GABAPENTIN 100 MG PO CAPS: 100.0000 mg | ORAL_CAPSULE | Freq: Three times a day (TID) | ORAL | 5 refills | Status: DC

## 2023-07-21 NOTE — Telephone Encounter (Signed)
Prescription Request  07/21/2023  LOV: 02/11/2023  What is the name of the medication or equipment? gabapentin (NEURONTIN) 100 MG capsule [[id number]]   Have you contacted your pharmacy to request a refill? Yes  Which pharmacy would you like this sent to?    [facility] [address] [place], [place] - [address] [place] [address] Phone: [phone] Fax: [phone]  Patient notified that their request is being sent to the clinical staff for review and that they should receive a response within 2 business days.   Please advise at  [phone]

## 2023-07-21 NOTE — Telephone Encounter (Signed)
Done erx 

## 2023-08-18 ENCOUNTER — Encounter: Payer: Self-pay | Admitting: Internal Medicine

## 2023-08-18 ENCOUNTER — Ambulatory Visit: Payer: PPO | Admitting: Internal Medicine

## 2023-08-18 ENCOUNTER — Ambulatory Visit (INDEPENDENT_AMBULATORY_CARE_PROVIDER_SITE_OTHER): Payer: PPO

## 2023-08-18 VITALS — BP 124/82 | HR 67 | Temp 98.4°F | Ht 74.0 in | Wt 235.0 lb

## 2023-08-18 DIAGNOSIS — M25551 Pain in right hip: Secondary | ICD-10-CM

## 2023-08-18 DIAGNOSIS — M25562 Pain in left knee: Secondary | ICD-10-CM

## 2023-08-18 DIAGNOSIS — M25561 Pain in right knee: Secondary | ICD-10-CM | POA: Diagnosis not present

## 2023-08-18 DIAGNOSIS — Z7984 Long term (current) use of oral hypoglycemic drugs: Secondary | ICD-10-CM

## 2023-08-18 DIAGNOSIS — E559 Vitamin D deficiency, unspecified: Secondary | ICD-10-CM

## 2023-08-18 DIAGNOSIS — E1165 Type 2 diabetes mellitus with hyperglycemia: Secondary | ICD-10-CM

## 2023-08-18 DIAGNOSIS — M25519 Pain in unspecified shoulder: Secondary | ICD-10-CM | POA: Insufficient documentation

## 2023-08-18 DIAGNOSIS — E538 Deficiency of other specified B group vitamins: Secondary | ICD-10-CM

## 2023-08-18 DIAGNOSIS — E1141 Type 2 diabetes mellitus with diabetic mononeuropathy: Secondary | ICD-10-CM | POA: Diagnosis not present

## 2023-08-18 LAB — HEPATIC FUNCTION PANEL
ALT: 27 U/L (ref 0–53)
AST: 18 U/L (ref 0–37)
Albumin: 4.4 g/dL (ref 3.5–5.2)
Alkaline Phosphatase: 59 U/L (ref 39–117)
Bilirubin, Direct: 0.1 mg/dL (ref 0.0–0.3)
Total Bilirubin: 0.7 mg/dL (ref 0.2–1.2)
Total Protein: 7.1 g/dL (ref 6.0–8.3)

## 2023-08-18 LAB — BASIC METABOLIC PANEL
BUN: 12 mg/dL (ref 6–23)
CO2: 28 meq/L (ref 19–32)
Calcium: 9.2 mg/dL (ref 8.4–10.5)
Chloride: 103 meq/L (ref 96–112)
Creatinine, Ser: 0.93 mg/dL (ref 0.40–1.50)
GFR: 82.41 mL/min (ref >60.00)
Glucose, Bld: 123 mg/dL — ABNORMAL HIGH (ref 70–99)
Potassium: 3.8 meq/L (ref 3.5–5.1)
Sodium: 138 meq/L (ref 135–145)

## 2023-08-18 LAB — LIPID PANEL
Cholesterol: 119 mg/dL (ref 0–200)
HDL: 35.9 mg/dL — ABNORMAL LOW (ref >39.00)
LDL Cholesterol: 38 mg/dL (ref 0–99)
NonHDL: 83.29
Total CHOL/HDL Ratio: 3
Triglycerides: 226 mg/dL — ABNORMAL HIGH (ref 0.0–149.0)
VLDL: 45.2 mg/dL — ABNORMAL HIGH (ref 0.0–40.0)

## 2023-08-18 LAB — HEMOGLOBIN A1C: Hgb A1c MFr Bld: 6.9 % — ABNORMAL HIGH (ref 4.6–6.5)

## 2023-08-18 MED ORDER — GABAPENTIN 100 MG PO CAPS: 100.0000 mg | ORAL_CAPSULE | Freq: Three times a day (TID) | ORAL | 1 refills | Status: DC

## 2023-08-18 MED ORDER — METFORMIN HCL ER 500 MG PO TB24: 500.0000 mg | ORAL_TABLET | Freq: Every day | ORAL | 3 refills | Status: DC

## 2023-08-18 NOTE — Progress Notes (Signed)
Patient ID: ROYZELL ARMINIO, male   DOB: [birth year], 72 y.o.   MRN: [id number]        Chief Complaint: follow up with increased gait difficutly neuropathy pain, recent fall with bilat knee and right groin pain, and DM       HPI:  ETZIO SINEX is a 72 y.o. male here with c/o increased balance and gait difficulty, got overbalanced with fall 1 wk ago to knees with abrasions now near healed but still with pain to patellar areas and right groin, asking for films.  No falls since then.  Also with increased neuropathy pain it seems in the past few months.  Did try gabapentin 100 tid with nausea but looking back may have had nausea from something else, willing to try again.  Needs metformin refill.  Pt denies chest pain, increased sob or doe, wheezing, orthopnea, PND, increased LE swelling, palpitations, dizziness or syncope.   Pt denies polydipsia, polyuria, or new focal neuro s/s.  Pt denies wt loss, night sweats, loss of appetite, or other constitutional symptoms         Wt Readings from Last 3 Encounters:  08/18/23 235 lb (106.6 kg)  05/13/23 230 lb 3.2 oz (104.4 kg)  02/11/23 231 lb (104.8 kg)   BP Readings from Last 3 Encounters:  08/18/23 124/82  05/13/23 (!) 148/82  02/11/23 130/80         Past Medical History:  Diagnosis Date   Allergic rhinitis    Allergy    SEASONAL,USE TO TAKE ALLERGY SHOTS   Anxiety    Arthritis    BACK,KNEES   Asthma, mild    Borderline diabetes mellitus    Depression    Diverticulosis of colon    Gallstones    Generalized anxiety disorder    GERD (gastroesophageal reflux disease)    Hemorrhoids    History of colonic polyps    Hyperlipidemia    Hypertension    Lumbar back pain    Obesity    Venous insufficiency    Past Surgical History:  Procedure Laterality Date   COLONOSCOPY  2011   LUMBAR FUSION     LUMBAR LAMINECTOMY  [id number]   POLYPECTOMY      reports that he has never smoked. He has never used smokeless tobacco. He reports that he does  not drink alcohol and does not use drugs. family history includes Colon cancer in his father and maternal grandfather; Diabetes in his father; Heart attack in his father; Lung cancer in his mother; Prostate cancer in his father; Stroke in his father. Allergies  Allergen Reactions   Cephalexin Itching   Current Outpatient Medications on File Prior to Visit  Medication Sig Dispense Refill   albuterol (VENTOLIN HFA) 108 (90 Base) MCG/ACT inhaler Inhale 2 puffs into the lungs every 6 (six) hours as needed for wheezing or shortness of breath. 1 each 0   ALPRAZolam (XANAX) 0.5 MG tablet Take 1 tablet (0.5 mg total) by mouth 3 (three) times daily as needed. for anxiety 90 tablet 2   amLODipine (NORVASC) 5 MG tablet Take 1 tablet (5 mg total) by mouth daily. 90 tablet 1   aspirin 81 MG tablet Take 81 mg by mouth daily.     Continuous Blood Gluc Receiver (FREESTYLE LIBRE 3 READER) DEVI 1 Device by Does not apply route daily. 1 each 0   Continuous Blood Gluc Sensor (FREESTYLE LIBRE 3 SENSOR) MISC Place 1 sensor on the skin every 14 days. Use to check  glucose continuously E11.9 2 each 11   fluticasone (FLONASE) 50 MCG/ACT nasal spray Use 2 spray(s) in each nostril once daily 48 g 5   fluticasone-salmeterol (ADVAIR) 250-50 MCG/ACT AEPB Inhale 1 puff into the lungs in the morning and at bedtime. 180 each 3   losartan (COZAAR) 50 MG tablet Take 1 tablet (50 mg total) by mouth daily. 90 tablet 3   omeprazole (PRILOSEC) 20 MG capsule Take 2 capsules (40 mg total) by mouth daily. 180 capsule 3   Probiotic Product (PROBIOTIC PO) Take by mouth.     sennosides-docusate sodium (SENOKOT-S) 8.6-50 MG tablet Take 1 tablet by mouth daily.     VITAMIN B COMPLEX-C PO Take by mouth.     VITAMIN D PO Take by mouth.     No current facility-administered medications on file prior to visit.        ROS:  All others reviewed and negative.  Objective        PE:  BP 124/82 (BP Location: Left Arm, Patient Position: Sitting,  Cuff Size: Normal)   Pulse 67   Temp 98.4 F (36.9 C) (Oral)   Ht 6\' 2"  (1.88 m)   Wt 235 lb (106.6 kg)   SpO2 98%   BMI 30.17 kg/m                 Constitutional: Pt appears in NAD               HENT: Head: NCAT.                Right Ear: External ear normal.                 Left Ear: External ear normal.                Eyes: . Pupils are equal, round, and reactive to light. Conjunctivae and EOM are normal               Nose: without d/c or deformity               Neck: Neck supple. Gross normal ROM               Cardiovascular: Normal rate and regular rhythm.                 Pulmonary/Chest: Effort normal and breath sounds without rales or wheezing.                Abd:  Soft, NT, ND, + BS, no organomegaly               Neurological: Pt is alert. At baseline orientation, motor grossly intact               Skin: Skin is warm.  LE edema - none, bilat patellas mild tender with near healed abrasions               Psychiatric: Pt behavior is normal without agitation   Micro: none  Cardiac tracings I have personally interpreted today:  none  Pertinent Radiological findings (summarize): none   Lab Results  Component Value Date   WBC 9.5 02/11/2023   HGB 16.7 02/11/2023   HCT 48.1 02/11/2023   PLT 291.0 02/11/2023   GLUCOSE 123 (H) 08/18/2023   CHOL 119 08/18/2023   TRIG 226.0 (H) 08/18/2023   HDL 35.90 (L) 08/18/2023   LDLDIRECT 53.0 02/11/2023   LDLCALC 38 08/18/2023   ALT 27 08/18/2023   AST  18 08/18/2023   NA 138 08/18/2023   K 3.8 08/18/2023   CL 103 08/18/2023   CREATININE 0.93 08/18/2023   BUN 12 08/18/2023   CO2 28 08/18/2023   TSH 0.48 02/11/2023   PSA 1.76 02/11/2023   INR 1.20 12/06/2015   HGBA1C 6.9 (H) 08/18/2023   MICROALBUR <0.7 02/11/2023   Assessment/Plan:  SHANEN SANTAROSA is a 72 y.o. White or Caucasian [1] male with  has a past medical history of Allergic rhinitis, Allergy, Anxiety, Arthritis, Asthma, mild, Borderline diabetes mellitus, Depression,  Diverticulosis of colon, Gallstones, Generalized anxiety disorder, GERD (gastroesophageal reflux disease), Hemorrhoids, History of colonic polyps, Hyperlipidemia, Hypertension, Lumbar back pain, Obesity, and Venous insufficiency.  Diabetic mononeuropathy associated with type 2 diabetes mellitus (HCC) Mild to mod, for gabapentin 100 tid,  to f/u any worsening symptoms or concerns  Diabetes (HCC) Lab Results  Component Value Date   HGBA1C 6.9 (H) 08/18/2023   Stable, pt to continue current medical treatment metfomrin ER 500 qd   Vitamin D deficiency Last vitamin D Lab Results  Component Value Date   VD25OH 40.59 02/11/2023   Stable, cont oral replacement   B12 deficiency Lab Results  Component Value Date   VITAMINB12 438 02/11/2023   Stable, cont oral replacement - b12 1000 mcg qd   Right hip pain With recent fall, for right hip xray  Acute pain of right knee Mild to mod, for right knee xray,  to f/u any worsening symptoms or concerns  Acute pain of left knee Mild to mod, for left knee xray,  to f/u any worsening symptoms or concerns  Followup: Return in about 6 months (around 02/16/2024).  Oliver Barre, MD 08/21/2023 2:46 PM [facility] - [facility] Internal Medicine

## 2023-08-18 NOTE — Progress Notes (Signed)
The test results show that your current treatment is OK, as the tests are stable.  Please continue the same plan.  There is no other need for change of treatment or further evaluation based on these results, at this time.  thanks 

## 2023-08-18 NOTE — Patient Instructions (Signed)
Please continue all other medications as before, including the restarting the gabapentin at three times per day  Please have the pharmacy call with any other refills you may need.  Please continue your efforts at being more active, low cholesterol diet, and weight control.  You are otherwise up to date with prevention measures today.  Please keep your appointments with your specialists as you may have planned  Please go to the XRAY Department in the first floor for the x-ray testing  Please go to the LAB at the blood drawing area for the tests to be done  You will be contacted by phone if any changes need to be made immediately.  Otherwise, you will receive a letter about your results with an explanation, but please check with MyChart first.  Please make an Appointment to return in 6 months, or sooner if needed

## 2023-08-20 ENCOUNTER — Other Ambulatory Visit: Payer: Self-pay | Admitting: Internal Medicine

## 2023-08-20 NOTE — Telephone Encounter (Signed)
Copied from CRM [id number]. Topic: Clinical - Medical Advice >> Aug 20, 2023  9:49 AM Leavy Cella D wrote: Reason for CRM: Patient stated that he has washed his Continuous Blood Gluc Receiver (FREESTYLE LIBRE 3 READER) and has tried to charge it overnight and it is not working .

## 2023-08-21 DIAGNOSIS — M25551 Pain in right hip: Secondary | ICD-10-CM | POA: Insufficient documentation

## 2023-08-21 DIAGNOSIS — M25562 Pain in left knee: Secondary | ICD-10-CM | POA: Insufficient documentation

## 2023-08-21 DIAGNOSIS — M25561 Pain in right knee: Secondary | ICD-10-CM | POA: Insufficient documentation

## 2023-08-21 NOTE — Assessment & Plan Note (Signed)
Lab Results  Component Value Date   HGBA1C 6.9 (H) 08/18/2023   Stable, pt to continue current medical treatment metfomrin ER 500 qd

## 2023-08-21 NOTE — Assessment & Plan Note (Signed)
Mild to mod, for left knee xray,  to f/u any worsening symptoms or concerns

## 2023-08-21 NOTE — Assessment & Plan Note (Signed)
Mild to mod, for right knee xray,  to f/u any worsening symptoms or concerns

## 2023-08-21 NOTE — Assessment & Plan Note (Signed)
Mild to mod, for gabapentin 100 tid,  to f/u any worsening symptoms or concerns

## 2023-08-21 NOTE — Assessment & Plan Note (Signed)
Lab Results  Component Value Date   VITAMINB12 438 02/11/2023   Stable, cont oral replacement - b12 1000 mcg qd

## 2023-08-21 NOTE — Assessment & Plan Note (Signed)
With recent fall, for right hip xray

## 2023-08-21 NOTE — Assessment & Plan Note (Signed)
Last vitamin D Lab Results  Component Value Date   VD25OH 40.59 02/11/2023   Stable, cont oral replacement

## 2023-08-23 ENCOUNTER — Telehealth: Payer: Self-pay

## 2023-08-23 ENCOUNTER — Other Ambulatory Visit: Payer: Self-pay | Admitting: Internal Medicine

## 2023-08-23 NOTE — Telephone Encounter (Signed)
Copied from CRM [id number]. Topic: Clinical - Medical Advice >> Aug 23, 2023  1:44 PM Florestine Avers wrote:  Reason for CRM: patient states that he is aware that dr Jonny Ruiz has been looking @ xray, only 2 knee xray are coming up. he said he has falling on them both each multiple times, has also recently fell on his rt buttocks, also is requesting a new freestyle libre 3 sensor? Pt has multiple inquires and is requesting a phone call from the nurse.

## 2023-08-24 ENCOUNTER — Other Ambulatory Visit: Payer: Self-pay

## 2023-08-24 MED ORDER — FREESTYLE LIBRE 3 READER DEVI: 1.0000 | Freq: Every day | 0 refills | Status: AC

## 2023-08-24 MED ORDER — FREESTYLE LIBRE 3 SENSOR MISC: 11 refills | Status: AC

## 2023-08-26 ENCOUNTER — Encounter: Payer: Self-pay | Admitting: Internal Medicine

## 2023-08-27 NOTE — Telephone Encounter (Signed)
Concerns addressed via MyChart.

## 2023-08-27 NOTE — Telephone Encounter (Signed)
Sounds good, hopefully we will hear soon about the xrays.   thanks

## 2023-09-02 ENCOUNTER — Telehealth: Payer: Self-pay

## 2023-09-02 NOTE — Telephone Encounter (Signed)
 Knee xrays are negative for fracture or new problem  The hips and pelvis xrays are still pending.  sorry

## 2023-09-02 NOTE — Telephone Encounter (Signed)
 Called and left voice mail

## 2023-09-02 NOTE — Telephone Encounter (Signed)
 Copied from CRM [id number]. Topic: Clinical - Lab/Test Results >> Aug 31, 2023  2:11 PM Jamee ORN wrote: Reason for CRM: Patient stated that he had xrays done on August 18, 2023, and yet to have anyone go over the results.  Patient stated he has fallen due to neuropathy, and pain in his left hip and lumbar region. Patient stated that he want to do pain management instead of a surgery, but needs to speak with someone first. Please call patient back at [phone].

## 2023-09-03 ENCOUNTER — Telehealth: Payer: Self-pay

## 2023-09-03 NOTE — Telephone Encounter (Signed)
 Copied from CRM [id number]. Topic: Clinical - Lab/Test Results >> Sep 02, 2023  4:00 PM Taleah C wrote: Reason for CRM: patient called back and I notified him pcp's message in chart regarding knee x-ray. He asked for a callback as soon as possible when other pending x-rays result.

## 2023-09-03 NOTE — Telephone Encounter (Signed)
 [name] [name] called and left message yesterday with results regarding knees.  Currently waiting on response for hips and pelvis xrays.

## 2023-09-06 ENCOUNTER — Telehealth: Payer: Self-pay

## 2023-09-06 NOTE — Telephone Encounter (Signed)
 Copied from CRM [id number]. Topic: Clinical - Lab/Test Results >> Sep 06, 2023  9:32 AM Drema Balzarine wrote:  Reason for CRM: Patient called regarding his imaging results for his knees/hips

## 2023-09-06 NOTE — Telephone Encounter (Signed)
 Results are now on mychart  Ok to go over the results with him as I have already documented

## 2023-09-08 NOTE — Telephone Encounter (Signed)
 Seen by Patient Via Mychart.

## 2023-09-09 ENCOUNTER — Telehealth: Payer: Self-pay | Admitting: Internal Medicine

## 2023-09-09 MED ORDER — ALPRAZOLAM 0.5 MG PO TABS: 0.5000 mg | ORAL_TABLET | Freq: Three times a day (TID) | ORAL | 2 refills | Status: DC | PRN
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
## 2023-09-09 NOTE — Telephone Encounter (Signed)
 Ok done erx
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
## 2023-09-16 ENCOUNTER — Encounter: Payer: Self-pay | Admitting: Internal Medicine

## 2023-09-23 DIAGNOSIS — M47818 Spondylosis without myelopathy or radiculopathy, sacral and sacrococcygeal region: Secondary | ICD-10-CM | POA: Diagnosis not present

## 2023-10-01 DIAGNOSIS — M542 Cervicalgia: Secondary | ICD-10-CM | POA: Diagnosis not present

## 2023-10-01 DIAGNOSIS — M5136 Other intervertebral disc degeneration, lumbar region with discogenic back pain only: Secondary | ICD-10-CM | POA: Diagnosis not present

## 2023-10-01 DIAGNOSIS — M791 Myalgia, unspecified site: Secondary | ICD-10-CM | POA: Diagnosis not present

## 2023-10-11 ENCOUNTER — Telehealth: Payer: Self-pay | Admitting: Internal Medicine

## 2023-10-11 NOTE — Telephone Encounter (Signed)
 Copied from CRM [id number]. Topic: Clinical - Medication Question >> Oct 11, 2023 10:33 AM Julie Oddi wrote: Reason for CRM: Patient was contacted by [facility] pharmacy regarding gabapentin  (NEURONTIN ) 100 MG capsule. Patient stated that he was advised by Dr. Autry Legions that the medication could be increased to 2 300mg  tabs per day. Patient wants to know if Dr. Autry Legions will change the prescription for it to be filed at [facility]. Please contact patient for clarification on medication refill and dose amount.

## 2023-10-12 MED ORDER — GABAPENTIN 300 MG PO CAPS: 300.0000 mg | ORAL_CAPSULE | Freq: Three times a day (TID) | ORAL | 3 refills | Status: DC

## 2023-10-12 NOTE — Addendum Note (Signed)
Addended by: Corwin Levins on: 10/12/2023 12:44 PM   Modules accepted: Orders

## 2023-10-12 NOTE — Telephone Encounter (Signed)
Ok done erx

## 2023-10-15 ENCOUNTER — Encounter: Payer: Self-pay | Admitting: Internal Medicine

## 2023-10-15 ENCOUNTER — Ambulatory Visit: Payer: PPO

## 2023-10-15 ENCOUNTER — Ambulatory Visit (INDEPENDENT_AMBULATORY_CARE_PROVIDER_SITE_OTHER): Payer: PPO | Admitting: Internal Medicine

## 2023-10-15 VITALS — BP 122/78 | HR 80 | Temp 98.5°F | Ht 74.0 in | Wt 240.0 lb

## 2023-10-15 DIAGNOSIS — E538 Deficiency of other specified B group vitamins: Secondary | ICD-10-CM | POA: Diagnosis not present

## 2023-10-15 DIAGNOSIS — E559 Vitamin D deficiency, unspecified: Secondary | ICD-10-CM

## 2023-10-15 DIAGNOSIS — Z7984 Long term (current) use of oral hypoglycemic drugs: Secondary | ICD-10-CM

## 2023-10-15 DIAGNOSIS — I10 Essential (primary) hypertension: Secondary | ICD-10-CM | POA: Diagnosis not present

## 2023-10-15 DIAGNOSIS — E1141 Type 2 diabetes mellitus with diabetic mononeuropathy: Secondary | ICD-10-CM

## 2023-10-15 DIAGNOSIS — E1165 Type 2 diabetes mellitus with hyperglycemia: Secondary | ICD-10-CM | POA: Diagnosis not present

## 2023-10-15 DIAGNOSIS — R079 Chest pain, unspecified: Secondary | ICD-10-CM

## 2023-10-15 DIAGNOSIS — Z125 Encounter for screening for malignant neoplasm of prostate: Secondary | ICD-10-CM

## 2023-10-15 DIAGNOSIS — Z0001 Encounter for general adult medical examination with abnormal findings: Secondary | ICD-10-CM

## 2023-10-15 LAB — CBC WITH DIFFERENTIAL/PLATELET
Basophils Absolute: 0 10*3/uL (ref 0.0–0.1)
Basophils Relative: 0.5 % (ref 0.0–3.0)
Eosinophils Absolute: 0.2 10*3/uL (ref 0.0–0.7)
Eosinophils Relative: 2.5 % (ref 0.0–5.0)
HCT: 47.4 % (ref 39.0–52.0)
Hemoglobin: 16.5 g/dL (ref 13.0–17.0)
Lymphocytes Relative: 18.5 % (ref 12.0–46.0)
Lymphs Abs: 1.8 10*3/uL (ref 0.7–4.0)
MCHC: 34.8 g/dL (ref 30.0–36.0)
MCV: 91.9 fL (ref 78.0–100.0)
Monocytes Absolute: 0.9 10*3/uL (ref 0.1–1.0)
Monocytes Relative: 9.1 % (ref 3.0–12.0)
Neutro Abs: 6.9 10*3/uL (ref 1.4–7.7)
Neutrophils Relative %: 69.4 % (ref 43.0–77.0)
Platelets: 311 10*3/uL (ref 150.0–400.0)
RBC: 5.16 Mil/uL (ref 4.22–5.81)
RDW: 12.9 % (ref 11.5–15.5)
WBC: 9.9 10*3/uL (ref 4.0–10.5)

## 2023-10-15 LAB — URINALYSIS, ROUTINE W REFLEX MICROSCOPIC
Bilirubin Urine: NEGATIVE
Hgb urine dipstick: NEGATIVE
Ketones, ur: NEGATIVE
Leukocytes,Ua: NEGATIVE
Nitrite: NEGATIVE
RBC / HPF: NONE SEEN (ref 0–?)
Specific Gravity, Urine: 1.01 (ref 1.000–1.030)
Total Protein, Urine: NEGATIVE
Urine Glucose: NEGATIVE
Urobilinogen, UA: 0.2 (ref 0.0–1.0)
pH: 6 (ref 5.0–8.0)

## 2023-10-15 LAB — BASIC METABOLIC PANEL
BUN: 8 mg/dL (ref 6–23)
CO2: 32 meq/L (ref 19–32)
Calcium: 9.2 mg/dL (ref 8.4–10.5)
Chloride: 100 meq/L (ref 96–112)
Creatinine, Ser: 0.85 mg/dL (ref 0.40–1.50)
GFR: 87.11 mL/min (ref >60.00)
Glucose, Bld: 132 mg/dL — ABNORMAL HIGH (ref 70–99)
Potassium: 3.6 meq/L (ref 3.5–5.1)
Sodium: 140 meq/L (ref 135–145)

## 2023-10-15 LAB — HEPATIC FUNCTION PANEL
ALT: 28 U/L (ref 0–53)
AST: 28 U/L (ref 0–37)
Albumin: 4.1 g/dL (ref 3.5–5.2)
Alkaline Phosphatase: 59 U/L (ref 39–117)
Bilirubin, Direct: 0.1 mg/dL (ref 0.0–0.3)
Total Bilirubin: 0.6 mg/dL (ref 0.2–1.2)
Total Protein: 6.5 g/dL (ref 6.0–8.3)

## 2023-10-15 LAB — MICROALBUMIN / CREATININE URINE RATIO
Creatinine,U: 86.1 mg/dL
Microalb Creat Ratio: 8.1 mg/g (ref 0.0–30.0)
Microalb, Ur: <0.7 mg/dL (ref 0.0–1.9)

## 2023-10-15 LAB — LIPID PANEL
Cholesterol: 125 mg/dL (ref 0–200)
HDL: 31.7 mg/dL — ABNORMAL LOW (ref >39.00)
LDL Cholesterol: 21 mg/dL (ref 0–99)
NonHDL: 93.09
Total CHOL/HDL Ratio: 4
Triglycerides: 360 mg/dL — ABNORMAL HIGH (ref 0.0–149.0)
VLDL: 72 mg/dL — ABNORMAL HIGH (ref 0.0–40.0)

## 2023-10-15 LAB — VITAMIN B12: Vitamin B-12: 309 pg/mL (ref 211–911)

## 2023-10-15 LAB — TSH: TSH: 0.41 u[IU]/mL (ref 0.35–5.50)

## 2023-10-15 LAB — VITAMIN D 25 HYDROXY (VIT D DEFICIENCY, FRACTURES): VITD: 37.39 ng/mL (ref 30.00–100.00)

## 2023-10-15 LAB — PSA: PSA: 1.79 ng/mL (ref 0.10–4.00)

## 2023-10-15 LAB — HEMOGLOBIN A1C: Hgb A1c MFr Bld: 6.7 % — ABNORMAL HIGH (ref 4.6–6.5)

## 2023-10-15 NOTE — Patient Instructions (Addendum)
Your EKG was done today  Please continue all other medications as before, and refills have been done if requested.  Please have the pharmacy call with any other refills you may need.  Please continue your efforts at being more active, low cholesterol diet, and weight control.  You are otherwise up to date with prevention measures today.  Please keep your appointments with your specialists as you may have planned  You will be contacted regarding the referral for: stress test  Please go to the XRAY Department in the first floor for the x-ray testing  Please go to the LAB at the blood drawing area for the tests to be done  You will be contacted by phone if any changes need to be made immediately.  Otherwise, you will receive a letter about your results with an explanation, but please check with MyChart first.  Please make an Appointment to return in 6 months, or sooner if needed, also with Lab Appointment for testing done 3-5 days before at the FIRST FLOOR Lab (so this is for TWO appointments - please see the scheduling desk as you leave)

## 2023-10-15 NOTE — Progress Notes (Signed)
Patient ID: [name] [name], male   DOB: 07-09-1951, 73 y.o.   MRN: [id number]         Chief Complaint:: wellness exam and Medical Management of Chronic Issues (Skin tightness on the left side  , trouble sleeping and right shoulder pain spreading into arms and ribs )  , neuropathy pain, dm       HPI:  [name] [name] is a 73 y.o. male here for wellness exam; declines covid booster and colonoscopy, o/w up to date                        Also Pt denies increased sob or doe, wheezing, orthopnea, PND, increased LE swelling, palpitations, dizziness or syncope, but has had vague chest tightness and right shoudler aching without diaphroesis, non exertional pleuritic or positional.   Pt denies polydipsia, polyuria, or new focal neuro s/s.    Pt denies fever, wt loss, night sweats, loss of appetite, or other constitutional symptoms    Does have worsening neuritic pain, asks for increased gabapentin to tid.     Wt Readings from Last 3 Encounters:  10/15/23 240 lb (108.9 kg)  08/18/23 235 lb (106.6 kg)  05/13/23 230 lb 3.2 oz (104.4 kg)   BP Readings from Last 3 Encounters:  10/15/23 122/78  08/18/23 124/82  05/13/23 (!) 148/82   Immunization History  Administered Date(s) Administered   Fluad Quad(high Dose 65+) 05/12/2019, 05/14/2020, 05/16/2021   Influenza Split 05/18/2011, 05/30/2012, 07/11/2012   Influenza Whole 06/06/2008, 05/29/2009, 06/02/2010   Influenza, High Dose Seasonal PF 05/31/2017, 06/01/2018   Influenza,inj,Quad PF,6+ Mos 05/24/2013, 05/31/2014, 06/07/2015, 05/19/2016   Influenza,inj,quad, With Preservative 05/31/2017   Influenza-Unspecified 08/03/2022   PFIZER(Purple Top)SARS-COV-2 Vaccination 11/20/2019, 12/11/2019, 06/15/2020, 12/09/2020   Pfizer Covid-19 Vaccine Bivalent Booster 5y-11y 05/27/2021   Pneumococcal Conjugate-13 01/06/2017   Pneumococcal Polysaccharide-23 06/01/2018   Pneumococcal-Unspecified 08/31/2016   Tdap 07/11/2012   Zoster Recombinant(Shingrix) 11/03/2021,  01/05/2022   Zoster, Live 01/07/2012   Health Maintenance Due  Topic Date Due   Medicare Annual Wellness (AWV)  05/14/2021   COVID-19 Vaccine (6 - 2024-25 season) 05/02/2023   Colonoscopy  12/24/2023      Past Medical History:  Diagnosis Date   Allergic rhinitis    Allergy    SEASONAL,USE TO TAKE ALLERGY SHOTS   Anxiety    Arthritis    BACK,KNEES   Asthma, mild    Borderline diabetes mellitus    Depression    Diverticulosis of colon    Gallstones    Generalized anxiety disorder    GERD (gastroesophageal reflux disease)    Hemorrhoids    History of colonic polyps    Hyperlipidemia    Hypertension    Lumbar back pain    Obesity    Venous insufficiency    Past Surgical History:  Procedure Laterality Date   COLONOSCOPY  2011   LUMBAR FUSION     LUMBAR LAMINECTOMY  [id number]   POLYPECTOMY      reports that he has never smoked. He has never used smokeless tobacco. He reports that he does not drink alcohol and does not use drugs. family history includes Colon cancer in his father and maternal grandfather; Diabetes in his father; Heart attack in his father; Lung cancer in his mother; Prostate cancer in his father; Stroke in his father. Allergies  Allergen Reactions   Cephalexin Itching   Current Outpatient Medications on File Prior to Visit  Medication Sig Dispense Refill  albuterol (VENTOLIN HFA) 108 (90 Base) MCG/ACT inhaler Inhale 2 puffs into the lungs every 6 (six) hours as needed for wheezing or shortness of breath. 1 each 0   ALPRAZolam (XANAX) 0.5 MG tablet Take 1 tablet (0.5 mg total) by mouth 3 (three) times daily as needed. for anxiety 90 tablet 2   amLODipine (NORVASC) 5 MG tablet Take 1 tablet (5 mg total) by mouth daily. 90 tablet 1   aspirin 81 MG tablet Take 81 mg by mouth daily.     Continuous Glucose Receiver (FREESTYLE LIBRE 3 READER) DEVI 1 Device by Does not apply route daily. 1 each 0   Continuous Glucose Sensor (FREESTYLE LIBRE 3 SENSOR) MISC  Place 1 sensor on the skin every 14 days. Use to check glucose continuously E11.9 2 each 11   fluticasone (FLONASE) 50 MCG/ACT nasal spray Use 2 spray(s) in each nostril once daily 48 g 5   fluticasone-salmeterol (ADVAIR) 250-50 MCG/ACT AEPB Inhale 1 puff into the lungs in the morning and at bedtime. 180 each 3   gabapentin (NEURONTIN) 300 MG capsule Take 1 capsule (300 mg total) by mouth 3 (three) times daily. 90 capsule 3   losartan (COZAAR) 50 MG tablet Take 1 tablet (50 mg total) by mouth daily. 90 tablet 3   metFORMIN (GLUCOPHAGE-XR) 500 MG 24 hr tablet Take 1 tablet (500 mg total) by mouth daily with breakfast. 90 tablet 3   omeprazole (PRILOSEC) 20 MG capsule Take 2 capsules (40 mg total) by mouth daily. 180 capsule 3   Probiotic Product (PROBIOTIC PO) Take by mouth.     sennosides-docusate sodium (SENOKOT-S) 8.6-50 MG tablet Take 1 tablet by mouth daily.     VITAMIN B COMPLEX-C PO Take by mouth.     VITAMIN D PO Take by mouth.     No current facility-administered medications on file prior to visit.        ROS:  All others reviewed and negative.  Objective        PE:  BP 122/78 (BP Location: Right Arm, Patient Position: Sitting, Cuff Size: Normal)   Pulse 80   Temp 98.5 F (36.9 C) (Oral)   Ht 6\' 2"  (1.88 m)   Wt 240 lb (108.9 kg)   SpO2 97%   BMI 30.81 kg/m                 Constitutional: Pt appears in NAD               HENT: Head: NCAT.                Right Ear: External ear normal.                 Left Ear: External ear normal.                Eyes: . Pupils are equal, round, and reactive to light. Conjunctivae and EOM are normal               Nose: without d/c or deformity               Neck: Neck supple. Gross normal ROM               Cardiovascular: Normal rate and regular rhythm.                 Pulmonary/Chest: Effort normal and breath sounds without rales or wheezing.  Abd:  Soft, NT, ND, + BS, no organomegaly               Neurological: Pt is alert.  At baseline orientation, motor grossly intact               Skin: Skin is warm. No rashes, no other new lesions, LE edema - none               Psychiatric: Pt behavior is normal without agitation   Micro: none  Cardiac tracings I have personally interpreted today:  ECG - SR with NS ST T wave changes  Pertinent Radiological findings (summarize): none   Lab Results  Component Value Date   WBC 9.9 10/15/2023   HGB 16.5 10/15/2023   HCT 47.4 10/15/2023   PLT 311.0 10/15/2023   GLUCOSE 132 (H) 10/15/2023   CHOL 125 10/15/2023   TRIG 360.0 (H) 10/15/2023   HDL 31.70 (L) 10/15/2023   LDLDIRECT 53.0 02/11/2023   LDLCALC 21 10/15/2023   ALT 28 10/15/2023   AST 28 10/15/2023   NA 140 10/15/2023   K 3.6 10/15/2023   CL 100 10/15/2023   CREATININE 0.85 10/15/2023   BUN 8 10/15/2023   CO2 32 10/15/2023   TSH 0.41 10/15/2023   PSA 1.79 10/15/2023   INR 1.20 12/06/2015   HGBA1C 6.7 (H) 10/15/2023   MICROALBUR <0.7 10/15/2023   Assessment/Plan:  MACKINLEY KIEHN is a 73 y.o. White or Caucasian [1] male with  has a past medical history of Allergic rhinitis, Allergy, Anxiety, Arthritis, Asthma, mild, Borderline diabetes mellitus, Depression, Diverticulosis of colon, Gallstones, Generalized anxiety disorder, GERD (gastroesophageal reflux disease), Hemorrhoids, History of colonic polyps, Hyperlipidemia, Hypertension, Lumbar back pain, Obesity, and Venous insufficiency.  Encounter for well adult exam with abnormal findings Age and sex appropriate education and counseling updated with regular exercise and diet Referrals for preventative services - declines colonoscopy Immunizations addressed - declines covid booster Smoking counseling  - none needed Evidence for depression or other mood disorder - chronic stable anxiety Most recent labs reviewed. I have personally reviewed and have noted: 1) the patient's medical and social history 2) The patient's current medications and supplements 3) The  patient's height, weight, and BMI have been recorded in the chart   Vitamin D deficiency Last vitamin D Lab Results  Component Value Date   VD25OH 37.39 10/15/2023   Low, to start oral replacement   Essential hypertension BP Readings from Last 3 Encounters:  10/15/23 122/78  08/18/23 124/82  05/13/23 (!) 148/82   Stable, pt to continue medical treatment norvasc 5 every day, losartan 50 qd   Diabetes (HCC) Lab Results  Component Value Date   HGBA1C 6.7 (H) 10/15/2023   Stable, pt to continue current medical treatment metformin ER 500 mg qd   B12 deficiency Lab Results  Component Value Date   VITAMINB12 309 10/15/2023   Stable, cont oral replacement - b12 1000 mcg qd   Chest pain Atypical, ecg reviewed, for cxr, stress testing  Diabetic mononeuropathy associated with type 2 diabetes mellitus (HCC) Also for increased gabepentin to tid  Followup: Return in about 6 months (around 04/13/2024).  Oliver Barre, MD 10/17/2023 12:32 AM [facility] - [facility] Medicine

## 2023-10-15 NOTE — Progress Notes (Signed)
The test results show that your current treatment is OK, as the tests are stable.  Please continue the same plan.  There is no other need for change of treatment or further evaluation based on these results, at this time.  thanks

## 2023-10-17 ENCOUNTER — Encounter: Payer: Self-pay | Admitting: Internal Medicine

## 2023-10-17 NOTE — Assessment & Plan Note (Signed)
BP Readings from Last 3 Encounters:  10/15/23 122/78  08/18/23 124/82  05/13/23 (!) 148/82   Stable, pt to continue medical treatment norvasc 5 every day, losartan 50 qd

## 2023-10-17 NOTE — Assessment & Plan Note (Signed)
Also for increased gabepentin to tid

## 2023-10-17 NOTE — Assessment & Plan Note (Signed)
Lab Results  Component Value Date   HGBA1C 6.7 (H) 10/15/2023   Stable, pt to continue current medical treatment metformin ER 500 mg qd

## 2023-10-17 NOTE — Assessment & Plan Note (Signed)
Lab Results  Component Value Date   VITAMINB12 309 10/15/2023   Stable, cont oral replacement - b12 1000 mcg qd

## 2023-10-17 NOTE — Assessment & Plan Note (Signed)
Atypical, ecg reviewed, for cxr, stress testing

## 2023-10-17 NOTE — Assessment & Plan Note (Addendum)
Age and sex appropriate education and counseling updated with regular exercise and diet Referrals for preventative services - declines colonoscopy Immunizations addressed - declines covid booster Smoking counseling  - none needed Evidence for depression or other mood disorder - chronic stable anxiety Most recent labs reviewed. I have personally reviewed and have noted: 1) the patient's medical and social history 2) The patient's current medications and supplements 3) The patient's height, weight, and BMI have been recorded in the chart

## 2023-10-17 NOTE — Assessment & Plan Note (Signed)
Last vitamin D Lab Results  Component Value Date   VD25OH 37.39 10/15/2023   Low, to start oral replacement

## 2023-10-22 ENCOUNTER — Encounter (HOSPITAL_COMMUNITY): Payer: Self-pay

## 2023-10-22 NOTE — Telephone Encounter (Signed)
Very sorry, I have to decline, as I cannot be responsible for all medications prescribed by other doctors for their reasons, and I dont have access to their records as well

## 2023-10-22 NOTE — Telephone Encounter (Signed)
Copied from CRM [id number]. Topic: Clinical - Medication Refill >> Oct 22, 2023  2:44 PM Tiffany H wrote: Most Recent Primary Care Visit:  Provider: Corwin Levins  Department: [facility]  Visit Type: ACUTE  Date: 10/15/2023  Medication: Patient called to request a refill for Baclofen 10mg  1x.   Patient was prescribed a number of medications via Delbert Harness but would like to continue with Dr. Jonny Ruiz. Please assist. Patient was unable to name all the medications Dr. Obie Dredge prescribed. Please follow up with Dr. Luis Abed.   [facility] [address] [place], [place] - [address], [place] [address] Phone: [phone]  Fax: [phone] DEA #: -- DAW Reason: --   Has the patient contacted their pharmacy? Yes (Agent: If no, request that the patient contact the pharmacy for the refill. If patient does not wish to contact the pharmacy document the reason why and proceed with request.) (Agent: If yes, when and what did the pharmacy advise?)  Is this the correct pharmacy for this prescription? Yes If no, delete pharmacy and type the correct one.  This is the patient's preferred pharmacy:  [facility] [address], [place] - [address] [place] [address] Phone: [phone] Fax: [phone]   Has the prescription been filled recently? No.   Is the patient out of the medication? Yes  Has the patient been seen for an appointment in the last year OR does the patient have an upcoming appointment? No  Can we respond through MyChart? No  Agent: Please be advised that Rx refills may take up to 3 business days. We ask that you follow-up with your pharmacy. >> Oct 22, 2023  2:47 PM Tiffany H wrote: Patient has a lot of follow up concerns. Please call patient to answer some questions.

## 2023-10-26 ENCOUNTER — Ambulatory Visit (HOSPITAL_COMMUNITY): Payer: PPO | Attending: Cardiology

## 2023-10-26 ENCOUNTER — Encounter: Payer: Self-pay | Admitting: Internal Medicine

## 2023-10-26 DIAGNOSIS — R079 Chest pain, unspecified: Secondary | ICD-10-CM | POA: Insufficient documentation

## 2023-10-26 LAB — MYOCARDIAL PERFUSION IMAGING
LV dias vol: 99 mL (ref 62–150)
LV sys vol: 40 mL
Nuc Stress EF: 60 %
Peak HR: 79 {beats}/min
Rest HR: 70 {beats}/min
Rest Nuclear Isotope Dose: 10.8 mCi
SDS: 0
SRS: 0
SSS: 0
ST Depression (mm): 0 mm
Stress Nuclear Isotope Dose: 31.3 mCi
TID: 1.11

## 2023-10-26 MED ORDER — TECHNETIUM TC 99M TETROFOSMIN IV KIT
10.8000 | PACK | Freq: Once | INTRAVENOUS | Status: AC | PRN
  Administered 2023-10-26: 10.8 via INTRAVENOUS

## 2023-10-26 MED ORDER — TECHNETIUM TC 99M TETROFOSMIN IV KIT
31.3000 | PACK | Freq: Once | INTRAVENOUS | Status: AC | PRN
  Administered 2023-10-26: 31.3 via INTRAVENOUS

## 2023-10-26 MED ORDER — REGADENOSON 0.4 MG/5ML IV SOLN
0.4000 mg | Freq: Once | INTRAVENOUS | Status: AC
Start: 2023-10-26 — End: 2023-10-26
  Administered 2023-10-26: 0.4 mg via INTRAVENOUS

## 2023-10-28 DIAGNOSIS — M19012 Primary osteoarthritis, left shoulder: Secondary | ICD-10-CM | POA: Diagnosis not present

## 2023-10-28 DIAGNOSIS — M542 Cervicalgia: Secondary | ICD-10-CM | POA: Diagnosis not present

## 2023-10-28 DIAGNOSIS — M791 Myalgia, unspecified site: Secondary | ICD-10-CM | POA: Diagnosis not present

## 2023-10-29 ENCOUNTER — Telehealth: Payer: Self-pay | Admitting: Internal Medicine

## 2023-10-29 NOTE — Telephone Encounter (Signed)
 Copied from CRM [id number]. Topic: General - Other >> Oct 29, 2023 12:04 PM Almira Coaster wrote: Reason for CRM: Patient is calling requesting a handicap parking permit, Patient has a lot of back pain and is being seen at [facility] in [place]. He states parking is horrible and it's always hard for him to get from the car to the office because he has to park so far away. Please advise patient if we're able to assist. Best call back number is [phone].

## 2023-10-31 ENCOUNTER — Encounter: Payer: Self-pay | Admitting: Internal Medicine

## 2023-11-02 ENCOUNTER — Telehealth: Payer: Self-pay | Admitting: Internal Medicine

## 2023-11-02 NOTE — Telephone Encounter (Signed)
 Copied from CRM [id number]. Topic: General - Other >> Nov 02, 2023 11:36 AM Sonny Dandy B wrote: Reason for CRM: pt called to request a handicap stick, information on the process. Please call pt back at [phone]

## 2023-11-02 NOTE — Telephone Encounter (Signed)
Ok done hardcopy to [facility]ma 

## 2023-11-02 NOTE — Telephone Encounter (Signed)
 Called and let Pt know its ready for pickup.

## 2023-11-04 ENCOUNTER — Telehealth: Payer: Self-pay

## 2023-11-04 NOTE — Telephone Encounter (Signed)
 Copied from CRM [id number]. Topic: Referral - Question >> Nov 04, 2023 11:12 AM Alcus Dad wrote: Reason for CRM: Dianna from Orthopedic clinic stated that patient is starting PT on March 17 and patient just had stress test and needs written clearance for Ortho to treat him. [phone] Fax: [phone]

## 2023-11-05 ENCOUNTER — Encounter: Payer: Self-pay | Admitting: Internal Medicine

## 2023-11-05 NOTE — Telephone Encounter (Signed)
Letter has been faxed today

## 2023-11-05 NOTE — Telephone Encounter (Signed)
 Ok pt is cleared,   Hardcopy letter done to cma     thanks

## 2023-11-10 ENCOUNTER — Telehealth: Payer: Self-pay

## 2023-11-10 ENCOUNTER — Telehealth: Payer: Self-pay | Admitting: Internal Medicine

## 2023-11-10 NOTE — Telephone Encounter (Signed)
 Copied from CRM [id number]. Topic: Clinical - Medical Advice >> Nov 10, 2023  3:57 PM Denese Killings wrote: Reason for CRM: Patient on the phone with [facility] and they were going over his medications and diagnosis. Patient wants a nurse to look over his choloesterol levels to see if he need to be placed on Statin. He also has questions with different number on his imaging and lab results and why doctor has to focus on one shoulder at a time and not his right shoulder joints. Please call patient.

## 2023-11-10 NOTE — Telephone Encounter (Signed)
 Copied from CRM [id number]. Topic: Clinical - Lab/Test Results >> Nov 10, 2023  3:52 PM Denese Killings wrote: Reason for CRM: Patient has not received a callback regarding his stress test.

## 2023-11-11 NOTE — Telephone Encounter (Signed)
 I decline now as this is normally discussed at an OV    thanks

## 2023-11-11 NOTE — Telephone Encounter (Signed)
 This message was done on mychart Oct 26 2023  The test results show that your current treatment is OK, as the stress test is normal.  Please continue the same plan.   There is no other need for change of treatment or further evaluation based on these results, at this time.  thanks

## 2023-11-14 ENCOUNTER — Encounter: Payer: Self-pay | Admitting: Internal Medicine

## 2023-11-17 ENCOUNTER — Ambulatory Visit (INDEPENDENT_AMBULATORY_CARE_PROVIDER_SITE_OTHER): Admitting: Internal Medicine

## 2023-11-17 VITALS — BP 120/80 | HR 84 | Temp 98.0°F | Ht 74.0 in | Wt 238.0 lb

## 2023-11-17 DIAGNOSIS — E538 Deficiency of other specified B group vitamins: Secondary | ICD-10-CM | POA: Diagnosis not present

## 2023-11-17 DIAGNOSIS — E1165 Type 2 diabetes mellitus with hyperglycemia: Secondary | ICD-10-CM

## 2023-11-17 DIAGNOSIS — E559 Vitamin D deficiency, unspecified: Secondary | ICD-10-CM | POA: Diagnosis not present

## 2023-11-17 DIAGNOSIS — Z7984 Long term (current) use of oral hypoglycemic drugs: Secondary | ICD-10-CM

## 2023-11-17 DIAGNOSIS — E7800 Pure hypercholesterolemia, unspecified: Secondary | ICD-10-CM

## 2023-11-17 DIAGNOSIS — I10 Essential (primary) hypertension: Secondary | ICD-10-CM | POA: Diagnosis not present

## 2023-11-17 MED ORDER — METFORMIN HCL ER 500 MG PO TB24: ORAL_TABLET | ORAL | 3 refills | Status: DC

## 2023-11-17 NOTE — Progress Notes (Signed)
 Patient ID: TALAL FRITCHMAN, male   DOB: [birth year], 73 y.o.   MRN: [id number]        Chief Complaint: follow up DM, left AC joint djd unable for cortisone injection, low vit , htn, hld       HPI:  KEYSHUN ELPERS is a 73 y.o. male here with mention that recently for unclear reasons his sugar have been in the higher 100's, may have been less active recently, but at last attempted cortisone injection he was unable due to CBG > 180.  Pt denies chest pain, increased sob or doe, wheezing, orthopnea, PND, increased LE swelling, palpitations, dizziness or syncope.  . Pt denies polydipsia, polyuria, or new focal neuro s/s.    Pt denies fever, wt loss, night sweats, loss of appetite, or other constitutional symptoms        Wt Readings from Last 3 Encounters:  11/17/23 238 lb (108 kg)  10/26/23 240 lb (108.9 kg)  10/15/23 240 lb (108.9 kg)   BP Readings from Last 3 Encounters:  11/17/23 120/80  10/15/23 122/78  08/18/23 124/82         Past Medical History:  Diagnosis Date   Allergic rhinitis    Allergy    SEASONAL,USE TO TAKE ALLERGY SHOTS   Anxiety    Arthritis    BACK,KNEES   Asthma, mild    Borderline diabetes mellitus    Depression    Diverticulosis of colon    Gallstones    Generalized anxiety disorder    GERD (gastroesophageal reflux disease)    Hemorrhoids    History of colonic polyps    Hyperlipidemia    Hypertension    Lumbar back pain    Obesity    Venous insufficiency    Past Surgical History:  Procedure Laterality Date   COLONOSCOPY  2011   LUMBAR FUSION     LUMBAR LAMINECTOMY  4098,1191   POLYPECTOMY      reports that he has never smoked. He has never used smokeless tobacco. He reports that he does not drink alcohol and does not use drugs. family history includes Colon cancer in his father and maternal grandfather; Diabetes in his father; Heart attack in his father; Lung cancer in his mother; Prostate cancer in his father; Stroke in his father. Allergies  Allergen  Reactions   Cephalexin Itching   Current Outpatient Medications on File Prior to Visit  Medication Sig Dispense Refill   albuterol (VENTOLIN HFA) 108 (90 Base) MCG/ACT inhaler Inhale 2 puffs into the lungs every 6 (six) hours as needed for wheezing or shortness of breath. 1 each 0   ALPRAZolam (XANAX) 0.5 MG tablet Take 1 tablet (0.5 mg total) by mouth 3 (three) times daily as needed. for anxiety 90 tablet 2   amLODipine (NORVASC) 5 MG tablet Take 1 tablet (5 mg total) by mouth daily. 90 tablet 1   aspirin 81 MG tablet Take 81 mg by mouth daily.     Continuous Glucose Receiver (FREESTYLE LIBRE 3 READER) DEVI 1 Device by Does not apply route daily. 1 each 0   Continuous Glucose Sensor (FREESTYLE LIBRE 3 SENSOR) MISC Place 1 sensor on the skin every 14 days. Use to check glucose continuously E11.9 2 each 11   fluticasone (FLONASE) 50 MCG/ACT nasal spray Use 2 spray(s) in each nostril once daily 48 g 5   fluticasone-salmeterol (ADVAIR) 250-50 MCG/ACT AEPB Inhale 1 puff into the lungs in the morning and at bedtime. 180 each 3  gabapentin (NEURONTIN) 300 MG capsule Take 1 capsule (300 mg total) by mouth 3 (three) times daily. 90 capsule 3   losartan (COZAAR) 50 MG tablet Take 1 tablet (50 mg total) by mouth daily. 90 tablet 3   omeprazole (PRILOSEC) 20 MG capsule Take 2 capsules (40 mg total) by mouth daily. 180 capsule 3   Probiotic Product (PROBIOTIC PO) Take by mouth.     sennosides-docusate sodium (SENOKOT-S) 8.6-50 MG tablet Take 1 tablet by mouth daily.     VITAMIN B COMPLEX-C PO Take by mouth.     VITAMIN D PO Take by mouth.     No current facility-administered medications on file prior to visit.        ROS:  All others reviewed and negative.  Objective        PE:  BP 120/80 (BP Location: Left Arm, Patient Position: Sitting, Cuff Size: Normal)   Pulse 84   Temp 98 F (36.7 C) (Oral)   Ht 6\' 2"  (1.88 m)   Wt 238 lb (108 kg)   SpO2 96%   BMI 30.56 kg/m                  Constitutional: Pt appears in NAD               HENT: Head: NCAT.                Right Ear: External ear normal.                 Left Ear: External ear normal.                Eyes: . Pupils are equal, round, and reactive to light. Conjunctivae and EOM are normal               Nose: without d/c or deformity               Neck: Neck supple. Gross normal ROM               Cardiovascular: Normal rate and regular rhythm.                 Pulmonary/Chest: Effort normal and breath sounds without rales or wheezing.                Abd:  Soft, NT, ND, + BS, no organomegaly               Neurological: Pt is alert. At baseline orientation, motor grossly intact               Skin: Skin is warm. No rashes, no other new lesions, LE edema - none               Psychiatric: Pt behavior is normal without agitation   Micro: none  Cardiac tracings I have personally interpreted today:  none  Pertinent Radiological findings (summarize): none   Lab Results  Component Value Date   WBC 9.9 10/15/2023   HGB 16.5 10/15/2023   HCT 47.4 10/15/2023   PLT 311.0 10/15/2023   GLUCOSE 132 (H) 10/15/2023   CHOL 125 10/15/2023   TRIG 360.0 (H) 10/15/2023   HDL 31.70 (L) 10/15/2023   LDLDIRECT 53.0 02/11/2023   LDLCALC 21 10/15/2023   ALT 28 10/15/2023   AST 28 10/15/2023   NA 140 10/15/2023   K 3.6 10/15/2023   CL 100 10/15/2023   CREATININE 0.85 10/15/2023   BUN 8 10/15/2023   CO2  32 10/15/2023   TSH 0.41 10/15/2023   PSA 1.79 10/15/2023   INR 1.20 12/06/2015   HGBA1C 6.7 (H) 10/15/2023   MICROALBUR <0.7 10/15/2023   Assessment/Plan:  BRODRIC SCHAUER is a 73 y.o. White or Caucasian [1] male with  has a past medical history of Allergic rhinitis, Allergy, Anxiety, Arthritis, Asthma, mild, Borderline diabetes mellitus, Depression, Diverticulosis of colon, Gallstones, Generalized anxiety disorder, GERD (gastroesophageal reflux disease), Hemorrhoids, History of colonic polyps, Hyperlipidemia, Hypertension,  Lumbar back pain, Obesity, and Venous insufficiency.  Vitamin D deficiency Last vitamin D Lab Results  Component Value Date   VD25OH 37.39 10/15/2023   Low, to start oral replacement   Hyperlipidemia Lab Results  Component Value Date   LDLCALC 21 10/15/2023   Stable, pt to continue current statin  - diet, wt control   Essential hypertension BP Readings from Last 3 Encounters:  11/17/23 120/80  10/15/23 122/78  08/18/23 124/82   Stable, pt to continue medical treatment norvasc 5 every day, losartan 50 qd   Diabetes (HCC) Lab Results  Component Value Date   HGBA1C 6.7 (H) 10/15/2023   Uncontrolled recently, pt to increase metformin ER 500 mg - 2 in the AM, and 1 in the PM for cbg > 150   B12 deficiency Lab Results  Component Value Date   VITAMINB12 309 10/15/2023   Stable, cont oral replacement - b12 1000 mcg qd  Followup: Return if symptoms worsen or fail to improve.  Oliver Barre, MD 11/20/2023 3:12 PM [facility]

## 2023-11-17 NOTE — Patient Instructions (Signed)
 Ok to change the metformin ER 500 mg to 2 in the am, and 1 in the afternoon for CBG more than 150  Please continue all other medications as before, and refills have been done if requested.  Please have the pharmacy call with any other refills you may need.  Please keep your appointments with your specialists as you may have planned

## 2023-11-20 ENCOUNTER — Encounter: Payer: Self-pay | Admitting: Internal Medicine

## 2023-11-20 NOTE — Assessment & Plan Note (Signed)
 Last vitamin D Lab Results  Component Value Date   VD25OH 37.39 10/15/2023   Low, to start oral replacement

## 2023-11-20 NOTE — Assessment & Plan Note (Signed)
 BP Readings from Last 3 Encounters:  11/17/23 120/80  10/15/23 122/78  08/18/23 124/82   Stable, pt to continue medical treatment norvasc 5 every day, losartan 50 qd

## 2023-11-20 NOTE — Assessment & Plan Note (Signed)
 Lab Results  Component Value Date   HGBA1C 6.7 (H) 10/15/2023   Uncontrolled recently, pt to increase metformin ER 500 mg - 2 in the AM, and 1 in the PM for cbg > 150

## 2023-11-20 NOTE — Assessment & Plan Note (Signed)
 Lab Results  Component Value Date   VITAMINB12 309 10/15/2023   Stable, cont oral replacement - b12 1000 mcg qd

## 2023-11-20 NOTE — Assessment & Plan Note (Signed)
 Lab Results  Component Value Date   LDLCALC 21 10/15/2023   Stable, pt to continue current statin  - diet, wt control

## 2023-11-23 ENCOUNTER — Other Ambulatory Visit: Payer: Self-pay | Admitting: Internal Medicine

## 2023-11-23 NOTE — Telephone Encounter (Signed)
 Copied from CRM [id number]. Topic: Clinical - Medication Refill >> Nov 23, 2023 11:58 AM Ernst Spell wrote: Most Recent Primary Care Visit:  Provider: Corwin Levins  Department: [facility]  Visit Type: ACUTE  Date: 11/17/2023  Medication: amlodipine  Has the patient contacted their pharmacy? Yes Per the pham, they faxed a request, I did not see in chart though Is this the correct pharmacy for this prescription? Yes If no, delete pharmacy and type the correct one.  This is the patient's preferred pharmacy:  [facility] [address] [place] - [address] [place] [address] Phone: [phone] Fax: [phone]  [id number] [address] [place] - [address] [place] [address] Phone: [phone] Fax: [phone]   Has the prescription been filled recently? Yes  Is the patient out of the medication? Yes  Has the patient been seen for an appointment in the last year OR does the patient have an upcoming appointment? Yes  Can we respond through MyChart? No  Agent: Please be advised that Rx refills may take up to 3 business days. We ask that you follow-up with your pharmacy.

## 2023-11-24 MED ORDER — AMLODIPINE BESYLATE 5 MG PO TABS: 5.0000 mg | ORAL_TABLET | Freq: Every day | ORAL | 1 refills | Status: DC

## 2023-11-26 DIAGNOSIS — M19012 Primary osteoarthritis, left shoulder: Secondary | ICD-10-CM | POA: Diagnosis not present

## 2023-11-30 DIAGNOSIS — M542 Cervicalgia: Secondary | ICD-10-CM | POA: Diagnosis not present

## 2023-12-07 DIAGNOSIS — M542 Cervicalgia: Secondary | ICD-10-CM | POA: Diagnosis not present

## 2023-12-09 ENCOUNTER — Other Ambulatory Visit: Payer: Self-pay | Admitting: Internal Medicine

## 2023-12-09 MED ORDER — ALPRAZOLAM 0.5 MG PO TABS: 0.5000 mg | ORAL_TABLET | Freq: Three times a day (TID) | ORAL | 2 refills | Status: DC | PRN

## 2023-12-09 NOTE — Telephone Encounter (Signed)
 Copied from CRM [id number]. Topic: Clinical - Medication Refill >> Dec 09, 2023  2:04 PM [name] wrote: Most Recent Primary Care Visit:  Provider: Corwin Levins  Department: [facility]  Visit Type: ACUTE  Date: 11/17/2023  Medication: ALPRAZolam Prudy Feeler) 0.5 MG tablet  Has the patient contacted their pharmacy? Yes (Agent: If no, request that the patient contact the pharmacy for the refill. If patient does not wish to contact the pharmacy document the reason why and proceed with request.) (Agent: If yes, when and what did the pharmacy advise?)  Is this the correct pharmacy for this prescription? Yes If no, delete pharmacy and type the correct one.  This is the patient's preferred pharmacy:  [facility] [address] [place] - [address] [place] [address] Phone: [phone] Fax: [phone]   Has the prescription been filled recently? Yes  Is the patient out of the medication? No(2 days)  Has the patient been seen for an appointment in the last year OR does the patient have an upcoming appointment? Yes  Can we respond through MyChart? Yes  Agent: Please be advised that Rx refills may take up to 3 business days. We ask that you follow-up with your pharmacy.

## 2023-12-14 DIAGNOSIS — M542 Cervicalgia: Secondary | ICD-10-CM | POA: Diagnosis not present

## 2023-12-15 DIAGNOSIS — E119 Type 2 diabetes mellitus without complications: Secondary | ICD-10-CM | POA: Diagnosis not present

## 2023-12-15 LAB — HM DIABETES EYE EXAM

## 2023-12-21 DIAGNOSIS — M542 Cervicalgia: Secondary | ICD-10-CM | POA: Diagnosis not present

## 2023-12-23 DIAGNOSIS — M5416 Radiculopathy, lumbar region: Secondary | ICD-10-CM | POA: Diagnosis not present

## 2023-12-23 DIAGNOSIS — M19012 Primary osteoarthritis, left shoulder: Secondary | ICD-10-CM | POA: Diagnosis not present

## 2023-12-24 ENCOUNTER — Telehealth: Payer: Self-pay | Admitting: Internal Medicine

## 2023-12-24 NOTE — Telephone Encounter (Signed)
 Pt already had a foot exam this year according to his chart , he is not due again until next year.

## 2023-12-24 NOTE — Telephone Encounter (Signed)
 Copied from CRM [id number]. Topic: General - Other >> Dec 24, 2023  3:38 PM [name] [name] wrote: Reason for CRM: Patient is requesting a call back from Dr. Koleen Perna nurse regarding a feet exam he had last year and if he is required to have that done yearly?

## 2023-12-29 ENCOUNTER — Ambulatory Visit (INDEPENDENT_AMBULATORY_CARE_PROVIDER_SITE_OTHER): Admitting: Internal Medicine

## 2023-12-29 ENCOUNTER — Ambulatory Visit (INDEPENDENT_AMBULATORY_CARE_PROVIDER_SITE_OTHER)

## 2023-12-29 ENCOUNTER — Encounter: Payer: Self-pay | Admitting: Internal Medicine

## 2023-12-29 VITALS — BP 118/76 | HR 80 | Temp 98.1°F | Ht 74.0 in | Wt 228.0 lb

## 2023-12-29 DIAGNOSIS — E559 Vitamin D deficiency, unspecified: Secondary | ICD-10-CM

## 2023-12-29 DIAGNOSIS — M79671 Pain in right foot: Secondary | ICD-10-CM | POA: Insufficient documentation

## 2023-12-29 DIAGNOSIS — M2011 Hallux valgus (acquired), right foot: Secondary | ICD-10-CM | POA: Diagnosis not present

## 2023-12-29 DIAGNOSIS — M79604 Pain in right leg: Secondary | ICD-10-CM | POA: Diagnosis not present

## 2023-12-29 DIAGNOSIS — I872 Venous insufficiency (chronic) (peripheral): Secondary | ICD-10-CM | POA: Diagnosis not present

## 2023-12-29 DIAGNOSIS — M79672 Pain in left foot: Secondary | ICD-10-CM | POA: Diagnosis not present

## 2023-12-29 DIAGNOSIS — M79605 Pain in left leg: Secondary | ICD-10-CM

## 2023-12-29 DIAGNOSIS — M2012 Hallux valgus (acquired), left foot: Secondary | ICD-10-CM | POA: Diagnosis not present

## 2023-12-29 MED ORDER — OMEPRAZOLE 20 MG PO CPDR
40.0000 mg | DELAYED_RELEASE_CAPSULE | Freq: Every day | ORAL | 3 refills | Status: AC
Start: 2023-12-29 — End: ?

## 2023-12-29 NOTE — Progress Notes (Signed)
 Patient ID: [name] [name], male   DOB: 30-Dec-1950, 73 y.o.   MRN: [id number]        Chief Complaint: follow up venous insufficiency, dm with neuropathy, bilateral foot pain, bilateral leg pain       HPI:  [name] [name] is a 73 y.o. male here again as difficult historian as he literally will not stop talking about numerous symptoms and not allowing clarifying questions from me, so very difficult to know why he is here or the relative importance of any particular statement; pt has hx neuropathy but has some pedal edema recently and is also concerned it seems about bilateral foot pain possible arthritis, and also circulatory issue.         Wt Readings from Last 3 Encounters:  12/29/23 228 lb (103.4 kg)  11/17/23 238 lb (108 kg)  10/26/23 240 lb (108.9 kg)   BP Readings from Last 3 Encounters:  12/29/23 118/76  11/17/23 120/80  10/15/23 122/78         Past Medical History:  Diagnosis Date   Allergic rhinitis    Allergy    SEASONAL,USE TO TAKE ALLERGY SHOTS   Anxiety    Arthritis    BACK,KNEES   Asthma, mild    Borderline diabetes mellitus    Depression    Diverticulosis of colon    Gallstones    Generalized anxiety disorder    GERD (gastroesophageal reflux disease)    Hemorrhoids    History of colonic polyps    Hyperlipidemia    Hypertension    Lumbar back pain    Obesity    Venous insufficiency    Past Surgical History:  Procedure Laterality Date   COLONOSCOPY  2011   LUMBAR FUSION     LUMBAR LAMINECTOMY  4098,1191   POLYPECTOMY      reports that he has never smoked. He has never used smokeless tobacco. He reports that he does not drink alcohol and does not use drugs. family history includes Colon cancer in his father and maternal grandfather; Diabetes in his father; Heart attack in his father; Lung cancer in his mother; Prostate cancer in his father; Stroke in his father. Allergies  Allergen Reactions   Cephalexin Itching   Current Outpatient Medications on File  Prior to Visit  Medication Sig Dispense Refill   albuterol  (VENTOLIN  HFA) 108 (90 Base) MCG/ACT inhaler Inhale 2 puffs into the lungs every 6 (six) hours as needed for wheezing or shortness of breath. 1 each 0   ALPRAZolam  (XANAX ) 0.5 MG tablet Take 1 tablet (0.5 mg total) by mouth 3 (three) times daily as needed. for anxiety 90 tablet 2   amLODipine  (NORVASC ) 5 MG tablet Take 1 tablet (5 mg total) by mouth daily. 90 tablet 1   aspirin  81 MG tablet Take 81 mg by mouth daily.     Continuous Glucose Receiver (FREESTYLE LIBRE 3 READER) DEVI 1 Device by Does not apply route daily. 1 each 0   Continuous Glucose Sensor (FREESTYLE LIBRE 3 SENSOR) MISC Place 1 sensor on the skin every 14 days. Use to check glucose continuously E11.9 2 each 11   fluticasone  (FLONASE ) 50 MCG/ACT nasal spray Use 2 spray(s) in each nostril once daily 48 g 5   fluticasone -salmeterol (ADVAIR) 250-50 MCG/ACT AEPB Inhale 1 puff into the lungs in the morning and at bedtime. 180 each 3   gabapentin  (NEURONTIN ) 300 MG capsule Take 1 capsule (300 mg total) by mouth 3 (three) times daily. 90 capsule 3  losartan  (COZAAR ) 50 MG tablet Take 1 tablet (50 mg total) by mouth daily. 90 tablet 3   metFORMIN  (GLUCOPHAGE -XR) 500 MG 24 hr tablet Take 2 tabs by mouth in the AM, and 1 tab in the afternoon for cbg > 150 270 tablet 3   Probiotic Product (PROBIOTIC PO) Take by mouth.     sennosides-docusate sodium  (SENOKOT-S) 8.6-50 MG tablet Take 1 tablet by mouth daily.     VITAMIN B COMPLEX-C PO Take by mouth.     VITAMIN D  PO Take by mouth.     No current facility-administered medications on file prior to visit.        ROS:  All others reviewed and negative.  Objective        PE:  BP 118/76 (BP Location: Left Arm, Patient Position: Sitting, Cuff Size: Normal)   Pulse 80   Temp 98.1 F (36.7 C) (Oral)   Ht 6\' 2"  (1.88 m)   Wt 228 lb (103.4 kg)   SpO2 94%   BMI 29.27 kg/m                 Constitutional: Pt appears in NAD                HENT: Head: NCAT.                Right Ear: External ear normal.                 Left Ear: External ear normal.                Eyes: . Pupils are equal, round, and reactive to light. Conjunctivae and EOM are normal               Nose: without d/c or deformity               Neck: Neck supple. Gross normal ROM               Cardiovascular: Normal rate and regular rhythm.                 Pulmonary/Chest: Effort normal and breath sounds without rales or wheezing.                Abd:  Soft, NT, ND, + BS, no organomegaly               Neurological: Pt is alert. At baseline orientation, motor grossly intact               Skin: Skin is warm. No rashes, no other new lesions, LE edema - trace ankle edema bilateral               Psychiatric: Pt behavior is normal without agitation   Micro: none  Cardiac tracings I have personally interpreted today:  none  Pertinent Radiological findings (summarize): none   Lab Results  Component Value Date   WBC 9.9 10/15/2023   HGB 16.5 10/15/2023   HCT 47.4 10/15/2023   PLT 311.0 10/15/2023   GLUCOSE 132 (H) 10/15/2023   CHOL 125 10/15/2023   TRIG 360.0 (H) 10/15/2023   HDL 31.70 (L) 10/15/2023   LDLDIRECT 53.0 02/11/2023   LDLCALC 21 10/15/2023   ALT 28 10/15/2023   AST 28 10/15/2023   NA 140 10/15/2023   K 3.6 10/15/2023   CL 100 10/15/2023   CREATININE 0.85 10/15/2023   BUN 8 10/15/2023   CO2 32 10/15/2023   TSH 0.41 10/15/2023  PSA 1.79 10/15/2023   INR 1.20 12/06/2015   HGBA1C 6.7 (H) 10/15/2023   MICROALBUR <0.7 10/15/2023   Assessment/Plan:  [name] [name] is a 73 y.o. White or Caucasian [1] male with  has a past medical history of Allergic rhinitis, Allergy, Anxiety, Arthritis, Asthma, mild, Borderline diabetes mellitus, Depression, Diverticulosis of colon, Gallstones, Generalized anxiety disorder, GERD (gastroesophageal reflux disease), Hemorrhoids, History of colonic polyps, Hyperlipidemia, Hypertension, Lumbar back pain, Obesity,  and Venous insufficiency.  Venous (peripheral) insufficiency Pt advised again about compression stockings, leg elevatino, low salt diet  Bilateral leg pain Cant r/o claudication - for arterial study  Bilateral foot pain Exam benign except for OA changes including left ankle medial malleolar bone spurring, for film today, podiatry referral  Vitamin D  deficiency Last vitamin D  Lab Results  Component Value Date   VD25OH 37.39 10/15/2023   Low, to start oral replacement  Followup: No follow-ups on file.  Rosalia Colonel, MD 12/29/2023 7:48 PM [facility] - [facility]

## 2023-12-29 NOTE — Patient Instructions (Addendum)
 Please continue all other medications as before, and refills have been done if requested.  Please have the pharmacy call with any other refills you may need.  Please keep your appointments with your specialists as you may have planned  You will be contacted regarding the referral for: Podiatry, and the artery circulation testing  Please go to the XRAY Department in the first floor for the x-ray testing  You will be contacted by phone if any changes need to be made immediately.  Otherwise, you will receive a letter about your results with an explanation, but please check with MyChart first.  Please make an Appointment to return in 6 months, or sooner if needed

## 2023-12-29 NOTE — Assessment & Plan Note (Signed)
 Cant r/o claudication - for arterial study

## 2023-12-29 NOTE — Assessment & Plan Note (Signed)
 Last vitamin D Lab Results  Component Value Date   VD25OH 37.39 10/15/2023   Low, to start oral replacement

## 2023-12-29 NOTE — Assessment & Plan Note (Signed)
 Pt advised again about compression stockings, leg elevatino, low salt diet

## 2023-12-29 NOTE — Assessment & Plan Note (Addendum)
 Exam benign except for OA changes including left ankle medial malleolar bone spurring, for film today, podiatry referral

## 2023-12-30 ENCOUNTER — Encounter: Payer: Self-pay | Admitting: Internal Medicine

## 2023-12-30 ENCOUNTER — Telehealth: Payer: Self-pay | Admitting: Internal Medicine

## 2023-12-30 NOTE — Telephone Encounter (Signed)
 Copied from CRM [id number]. Topic: General - Other >> Dec 30, 2023  3:05 PM Dorisann Garre T wrote: Reason for CRM: patient is needing a call back regarding the his referral he wanted to know if he is suppose to pick out the dr are will dr Autry Legions be picking out the orthopedic dr for him he is unsure and would like a call back

## 2023-12-31 NOTE — Telephone Encounter (Signed)
 Copied from CRM [id number]. Topic: General - Other >> Dec 31, 2023 10:08 AM Juluis Ok wrote: Patient calling to check the status of U/S and podiatry referral. Advised patient that referrals are pending and will be contacted for scheduling.

## 2024-01-03 ENCOUNTER — Telehealth: Payer: Self-pay | Admitting: Internal Medicine

## 2024-01-03 NOTE — Telephone Encounter (Signed)
 Copied from CRM [id number]. Topic: General - Other >> Jan 03, 2024  3:36 PM Dorisann Garre T wrote: Reason for CRM: patient has a abi order by dr Autry Legions that needs pa before the appt can be scheduled for patient if dr Autry Legions can add the reference number pa to the order the appt can be scheduled

## 2024-01-04 NOTE — Telephone Encounter (Signed)
 Sorry I would not know how to do this, thanks

## 2024-01-10 DIAGNOSIS — C44311 Basal cell carcinoma of skin of nose: Secondary | ICD-10-CM | POA: Diagnosis not present

## 2024-01-10 DIAGNOSIS — E1142 Type 2 diabetes mellitus with diabetic polyneuropathy: Secondary | ICD-10-CM | POA: Diagnosis not present

## 2024-01-10 DIAGNOSIS — I10 Essential (primary) hypertension: Secondary | ICD-10-CM | POA: Diagnosis not present

## 2024-01-10 DIAGNOSIS — M79604 Pain in right leg: Secondary | ICD-10-CM | POA: Diagnosis not present

## 2024-01-10 DIAGNOSIS — M79671 Pain in right foot: Secondary | ICD-10-CM | POA: Diagnosis not present

## 2024-01-10 DIAGNOSIS — M79605 Pain in left leg: Secondary | ICD-10-CM | POA: Diagnosis not present

## 2024-01-10 DIAGNOSIS — M79672 Pain in left foot: Secondary | ICD-10-CM | POA: Diagnosis not present

## 2024-01-10 DIAGNOSIS — G894 Chronic pain syndrome: Secondary | ICD-10-CM | POA: Diagnosis not present

## 2024-01-26 DIAGNOSIS — E1142 Type 2 diabetes mellitus with diabetic polyneuropathy: Secondary | ICD-10-CM | POA: Diagnosis not present

## 2024-01-27 DIAGNOSIS — G8929 Other chronic pain: Secondary | ICD-10-CM | POA: Diagnosis not present

## 2024-01-27 DIAGNOSIS — M5431 Sciatica, right side: Secondary | ICD-10-CM | POA: Diagnosis not present

## 2024-01-27 DIAGNOSIS — M545 Low back pain, unspecified: Secondary | ICD-10-CM | POA: Diagnosis not present

## 2024-01-31 ENCOUNTER — Encounter: Payer: Self-pay | Admitting: Podiatry

## 2024-01-31 ENCOUNTER — Ambulatory Visit: Admitting: Podiatry

## 2024-01-31 DIAGNOSIS — M79675 Pain in left toe(s): Secondary | ICD-10-CM | POA: Diagnosis not present

## 2024-01-31 DIAGNOSIS — M79674 Pain in right toe(s): Secondary | ICD-10-CM | POA: Diagnosis not present

## 2024-01-31 DIAGNOSIS — B351 Tinea unguium: Secondary | ICD-10-CM | POA: Diagnosis not present

## 2024-01-31 DIAGNOSIS — E1142 Type 2 diabetes mellitus with diabetic polyneuropathy: Secondary | ICD-10-CM

## 2024-01-31 NOTE — Progress Notes (Signed)
  Subjective:  Patient ID: [name] [name], male    DOB: Jan 01, 1951,   MRN: [id number]  Chief Complaint  Patient presents with   diabetic foot care    Rm 22/ dfc/ Alc 7.4    73 y.o. male presents for concern of thickened elongated and painful nails that are difficult to trim. Requesting to have them trimmed today. Relates burning and tingling in their feet. Patient is diabetic and last A1c was  Lab Results  Component Value Date   HGBA1C 6.7 (H) 10/15/2023   .   PCP:  Efrain Grant, PA-C    . Denies any other pedal complaints. Denies n/v/f/c.   Past Medical History:  Diagnosis Date   Allergic rhinitis    Allergy    SEASONAL,USE TO TAKE ALLERGY SHOTS   Anxiety    Arthritis    BACK,KNEES   Asthma, mild    Borderline diabetes mellitus    Depression    Diverticulosis of colon    Gallstones    Generalized anxiety disorder    GERD (gastroesophageal reflux disease)    Hemorrhoids    History of colonic polyps    Hyperlipidemia    Hypertension    Lumbar back pain    Obesity    Venous insufficiency     Objective:  Physical Exam: Vascular: DP/PT pulses 2/4 bilateral. CFT <3 seconds. Absent hair growth on digits. Edema noted to bilateral lower extremities. Xerosis noted bilaterally.  Skin. No lacerations or abrasions bilateral feet. Nails 1-5 bilateral  are thickened discolored and elongated with subungual debris.  Musculoskeletal: MMT 5/5 bilateral lower extremities in DF, PF, Inversion and Eversion. Deceased ROM in DF of ankle joint.  Neurological: Sensation intact to light touch. Protective sensation diminished bilateral.    Assessment:   1. Pain due to onychomycosis of toenails of both feet   2. Type 2 diabetes mellitus with peripheral neuropathy (HCC)      Plan:  Patient was evaluated and treated and all questions answered. -Discussed and educated patient on diabetic foot care, especially with  regards to the vascular, neurological and musculoskeletal systems.   -Stressed the importance of good glycemic control and the detriment of not  controlling glucose levels in relation to the foot. -Discussed supportive shoes at all times and checking feet regularly.  -Mechanically debrided all nails 1-5 bilateral using sterile nail nipper and filed with dremel without incident  DM shoe prescription provided.  -Answered all patient questions -Patient to return  in 3 months for at risk foot care -Patient advised to call the office if any problems or questions arise in the meantime.   Jennefer Moats, DPM

## 2024-02-01 ENCOUNTER — Ambulatory Visit (HOSPITAL_COMMUNITY)
Admission: RE | Admit: 2024-02-01 | Discharge: 2024-02-01 | Disposition: A | Discharge status: Home / Self Care | Source: Ambulatory Visit | Attending: Vascular Surgery | Admitting: Vascular Surgery

## 2024-02-01 ENCOUNTER — Other Ambulatory Visit (HOSPITAL_COMMUNITY): Payer: Self-pay | Admitting: Physician Assistant

## 2024-02-01 DIAGNOSIS — R52 Pain, unspecified: Secondary | ICD-10-CM | POA: Diagnosis not present

## 2024-02-01 LAB — VAS US ABI WITH/WO TBI
Left ABI: 1.21
Right ABI: 1.14

## 2024-02-03 DIAGNOSIS — M5136 Other intervertebral disc degeneration, lumbar region with discogenic back pain only: Secondary | ICD-10-CM | POA: Diagnosis not present

## 2024-02-14 ENCOUNTER — Other Ambulatory Visit: Payer: Self-pay | Admitting: Physician Assistant

## 2024-02-14 DIAGNOSIS — M5431 Sciatica, right side: Secondary | ICD-10-CM

## 2024-02-14 DIAGNOSIS — M5416 Radiculopathy, lumbar region: Secondary | ICD-10-CM

## 2024-02-19 ENCOUNTER — Ambulatory Visit
Admission: RE | Admit: 2024-02-19 | Discharge: 2024-02-19 | Disposition: A | Discharge status: Home / Self Care | Source: Ambulatory Visit | Attending: Physician Assistant | Admitting: Physician Assistant

## 2024-02-19 DIAGNOSIS — M5416 Radiculopathy, lumbar region: Secondary | ICD-10-CM

## 2024-02-19 DIAGNOSIS — M48061 Spinal stenosis, lumbar region without neurogenic claudication: Secondary | ICD-10-CM | POA: Diagnosis not present

## 2024-02-19 DIAGNOSIS — M5431 Sciatica, right side: Secondary | ICD-10-CM

## 2024-02-19 DIAGNOSIS — M47816 Spondylosis without myelopathy or radiculopathy, lumbar region: Secondary | ICD-10-CM | POA: Diagnosis not present

## 2024-03-16 ENCOUNTER — Ambulatory Visit: Admitting: Family Medicine

## 2024-03-20 DIAGNOSIS — Z1211 Encounter for screening for malignant neoplasm of colon: Secondary | ICD-10-CM | POA: Diagnosis not present

## 2024-03-20 DIAGNOSIS — E119 Type 2 diabetes mellitus without complications: Secondary | ICD-10-CM | POA: Diagnosis not present

## 2024-03-20 DIAGNOSIS — Z629 Problem related to upbringing, unspecified: Secondary | ICD-10-CM | POA: Diagnosis not present

## 2024-03-20 DIAGNOSIS — F429 Obsessive-compulsive disorder, unspecified: Secondary | ICD-10-CM | POA: Diagnosis not present

## 2024-03-20 DIAGNOSIS — I10 Essential (primary) hypertension: Secondary | ICD-10-CM | POA: Diagnosis not present

## 2024-03-20 DIAGNOSIS — F411 Generalized anxiety disorder: Secondary | ICD-10-CM | POA: Diagnosis not present

## 2024-03-24 ENCOUNTER — Encounter: Payer: Self-pay | Admitting: Internal Medicine

## 2024-03-30 DIAGNOSIS — E876 Hypokalemia: Secondary | ICD-10-CM | POA: Diagnosis not present

## 2024-03-31 DIAGNOSIS — G894 Chronic pain syndrome: Secondary | ICD-10-CM | POA: Diagnosis not present

## 2024-04-13 DIAGNOSIS — L821 Other seborrheic keratosis: Secondary | ICD-10-CM | POA: Diagnosis not present

## 2024-04-13 DIAGNOSIS — D485 Neoplasm of uncertain behavior of skin: Secondary | ICD-10-CM | POA: Diagnosis not present

## 2024-04-13 DIAGNOSIS — C44319 Basal cell carcinoma of skin of other parts of face: Secondary | ICD-10-CM | POA: Diagnosis not present

## 2024-04-13 DIAGNOSIS — D2262 Melanocytic nevi of left upper limb, including shoulder: Secondary | ICD-10-CM | POA: Diagnosis not present

## 2024-04-13 DIAGNOSIS — C44619 Basal cell carcinoma of skin of left upper limb, including shoulder: Secondary | ICD-10-CM | POA: Diagnosis not present

## 2024-04-13 DIAGNOSIS — L905 Scar conditions and fibrosis of skin: Secondary | ICD-10-CM | POA: Diagnosis not present

## 2024-04-13 DIAGNOSIS — Z85828 Personal history of other malignant neoplasm of skin: Secondary | ICD-10-CM | POA: Diagnosis not present

## 2024-04-13 DIAGNOSIS — D225 Melanocytic nevi of trunk: Secondary | ICD-10-CM | POA: Diagnosis not present

## 2024-04-13 DIAGNOSIS — D2261 Melanocytic nevi of right upper limb, including shoulder: Secondary | ICD-10-CM | POA: Diagnosis not present

## 2024-05-03 ENCOUNTER — Ambulatory Visit (INDEPENDENT_AMBULATORY_CARE_PROVIDER_SITE_OTHER): Admitting: Podiatry

## 2024-05-03 ENCOUNTER — Encounter: Payer: Self-pay | Admitting: Podiatry

## 2024-05-03 DIAGNOSIS — B351 Tinea unguium: Secondary | ICD-10-CM | POA: Diagnosis not present

## 2024-05-03 DIAGNOSIS — M79674 Pain in right toe(s): Secondary | ICD-10-CM

## 2024-05-03 DIAGNOSIS — M79675 Pain in left toe(s): Secondary | ICD-10-CM

## 2024-05-03 DIAGNOSIS — E1142 Type 2 diabetes mellitus with diabetic polyneuropathy: Secondary | ICD-10-CM

## 2024-05-03 NOTE — Progress Notes (Signed)
  Subjective:  Patient ID: [name] [name], male    DOB: 02/11/1951,   MRN: [id number]  Chief Complaint  Patient presents with   Diabetes    She usually clips my toenails.  I'm Diabetic.  My feet get cold.    73 y.o. male presents for concern of thickened elongated and painful nails that are difficult to trim. Requesting to have them trimmed today. Relates burning and tingling in their feet. Patient is diabetic and last A1c was  Lab Results  Component Value Date   HGBA1C 6.7 (H) 10/15/2023   .   PCP:  Sheldon Netter, PA    . Denies any other pedal complaints. Denies n/v/f/c.   Past Medical History:  Diagnosis Date   Allergic rhinitis    Allergy    SEASONAL,USE TO TAKE ALLERGY SHOTS   Anxiety    Arthritis    BACK,KNEES   Asthma, mild    Borderline diabetes mellitus    Depression    Diverticulosis of colon    Gallstones    Generalized anxiety disorder    GERD (gastroesophageal reflux disease)    Hemorrhoids    History of colonic polyps    Hyperlipidemia    Hypertension    Lumbar back pain    Obesity    Venous insufficiency     Objective:  Physical Exam: Vascular: DP/PT pulses 2/4 bilateral. CFT <3 seconds. Absent hair growth on digits. Edema noted to bilateral lower extremities. Xerosis noted bilaterally.  Skin. No lacerations or abrasions bilateral feet. Nails 1-5 bilateral  are thickened discolored and elongated with subungual debris.  Musculoskeletal: MMT 5/5 bilateral lower extremities in DF, PF, Inversion and Eversion. Deceased ROM in DF of ankle joint.  Neurological: Sensation intact to light touch. Protective sensation diminished bilateral.    Assessment:   1. Pain due to onychomycosis of toenails of both feet   2. Type 2 diabetes mellitus with peripheral neuropathy (HCC)      Plan:  Patient was evaluated and treated and all questions answered. -Discussed and educated patient on diabetic foot care, especially with  regards to the vascular,  neurological and musculoskeletal systems.  -Stressed the importance of good glycemic control and the detriment of not  controlling glucose levels in relation to the foot. -Discussed supportive shoes at all times and checking feet regularly.  -Mechanically debrided all nails 1-5 bilateral using sterile nail nipper and filed with dremel without incident   -Answered all patient questions -Patient to return  in 6 months for at risk foot care -Patient advised to call the office if any problems or questions arise in the meantime.   Asberry Failing, DPM

## 2024-05-04 DIAGNOSIS — E876 Hypokalemia: Secondary | ICD-10-CM | POA: Diagnosis not present

## 2024-05-04 DIAGNOSIS — F411 Generalized anxiety disorder: Secondary | ICD-10-CM | POA: Diagnosis not present

## 2024-05-11 DIAGNOSIS — F3181 Bipolar II disorder: Secondary | ICD-10-CM | POA: Diagnosis not present

## 2024-05-11 DIAGNOSIS — Z1331 Encounter for screening for depression: Secondary | ICD-10-CM | POA: Diagnosis not present

## 2024-05-11 DIAGNOSIS — Z125 Encounter for screening for malignant neoplasm of prostate: Secondary | ICD-10-CM | POA: Diagnosis not present

## 2024-05-11 DIAGNOSIS — Z1339 Encounter for screening examination for other mental health and behavioral disorders: Secondary | ICD-10-CM | POA: Diagnosis not present

## 2024-05-11 DIAGNOSIS — E559 Vitamin D deficiency, unspecified: Secondary | ICD-10-CM | POA: Diagnosis not present

## 2024-05-11 DIAGNOSIS — D539 Nutritional anemia, unspecified: Secondary | ICD-10-CM | POA: Diagnosis not present

## 2024-05-11 DIAGNOSIS — Z7251 High risk heterosexual behavior: Secondary | ICD-10-CM | POA: Diagnosis not present

## 2024-05-11 DIAGNOSIS — Z0000 Encounter for general adult medical examination without abnormal findings: Secondary | ICD-10-CM | POA: Diagnosis not present

## 2024-05-11 DIAGNOSIS — R0602 Shortness of breath: Secondary | ICD-10-CM | POA: Diagnosis not present

## 2024-05-11 DIAGNOSIS — E1142 Type 2 diabetes mellitus with diabetic polyneuropathy: Secondary | ICD-10-CM | POA: Diagnosis not present

## 2024-05-11 DIAGNOSIS — F419 Anxiety disorder, unspecified: Secondary | ICD-10-CM | POA: Diagnosis not present

## 2024-05-11 DIAGNOSIS — R5383 Other fatigue: Secondary | ICD-10-CM | POA: Diagnosis not present

## 2024-05-11 DIAGNOSIS — Z79899 Other long term (current) drug therapy: Secondary | ICD-10-CM | POA: Diagnosis not present

## 2024-05-11 DIAGNOSIS — E6609 Other obesity due to excess calories: Secondary | ICD-10-CM | POA: Diagnosis not present

## 2024-05-11 DIAGNOSIS — Z1159 Encounter for screening for other viral diseases: Secondary | ICD-10-CM | POA: Diagnosis not present

## 2024-05-11 DIAGNOSIS — M129 Arthropathy, unspecified: Secondary | ICD-10-CM | POA: Diagnosis not present

## 2024-05-13 ENCOUNTER — Other Ambulatory Visit: Payer: Self-pay | Admitting: Internal Medicine

## 2024-05-23 DIAGNOSIS — R945 Abnormal results of liver function studies: Secondary | ICD-10-CM | POA: Diagnosis not present

## 2024-05-23 DIAGNOSIS — F3181 Bipolar II disorder: Secondary | ICD-10-CM | POA: Diagnosis not present

## 2024-05-23 DIAGNOSIS — R7989 Other specified abnormal findings of blood chemistry: Secondary | ICD-10-CM | POA: Diagnosis not present

## 2024-05-23 DIAGNOSIS — E6609 Other obesity due to excess calories: Secondary | ICD-10-CM | POA: Diagnosis not present

## 2024-05-23 DIAGNOSIS — Z79899 Other long term (current) drug therapy: Secondary | ICD-10-CM | POA: Diagnosis not present

## 2024-05-23 DIAGNOSIS — K8020 Calculus of gallbladder without cholecystitis without obstruction: Secondary | ICD-10-CM | POA: Diagnosis not present

## 2024-05-23 DIAGNOSIS — F419 Anxiety disorder, unspecified: Secondary | ICD-10-CM | POA: Diagnosis not present

## 2024-05-23 DIAGNOSIS — E1142 Type 2 diabetes mellitus with diabetic polyneuropathy: Secondary | ICD-10-CM | POA: Diagnosis not present

## 2024-05-23 DIAGNOSIS — K219 Gastro-esophageal reflux disease without esophagitis: Secondary | ICD-10-CM | POA: Diagnosis not present

## 2024-05-23 DIAGNOSIS — B009 Herpesviral infection, unspecified: Secondary | ICD-10-CM | POA: Diagnosis not present

## 2024-05-23 DIAGNOSIS — K760 Fatty (change of) liver, not elsewhere classified: Secondary | ICD-10-CM | POA: Diagnosis not present

## 2024-05-24 DIAGNOSIS — R079 Chest pain, unspecified: Secondary | ICD-10-CM | POA: Diagnosis not present

## 2024-05-26 DIAGNOSIS — H04123 Dry eye syndrome of bilateral lacrimal glands: Secondary | ICD-10-CM | POA: Diagnosis not present

## 2024-05-29 ENCOUNTER — Encounter (HOSPITAL_COMMUNITY): Payer: Self-pay

## 2024-05-29 ENCOUNTER — Emergency Department (HOSPITAL_COMMUNITY)

## 2024-05-29 ENCOUNTER — Emergency Department (HOSPITAL_COMMUNITY)
Admission: EM | Admit: 2024-05-29 | Discharge: 2024-05-29 | Disposition: A | Discharge status: Home / Self Care | Attending: Emergency Medicine | Admitting: Emergency Medicine

## 2024-05-29 DIAGNOSIS — I10 Essential (primary) hypertension: Secondary | ICD-10-CM | POA: Diagnosis not present

## 2024-05-29 DIAGNOSIS — Z7982 Long term (current) use of aspirin: Secondary | ICD-10-CM | POA: Diagnosis not present

## 2024-05-29 DIAGNOSIS — R109 Unspecified abdominal pain: Secondary | ICD-10-CM | POA: Diagnosis present

## 2024-05-29 DIAGNOSIS — K7689 Other specified diseases of liver: Secondary | ICD-10-CM | POA: Diagnosis not present

## 2024-05-29 DIAGNOSIS — K8020 Calculus of gallbladder without cholecystitis without obstruction: Secondary | ICD-10-CM | POA: Insufficient documentation

## 2024-05-29 DIAGNOSIS — E876 Hypokalemia: Secondary | ICD-10-CM | POA: Insufficient documentation

## 2024-05-29 DIAGNOSIS — Z79899 Other long term (current) drug therapy: Secondary | ICD-10-CM | POA: Insufficient documentation

## 2024-05-29 DIAGNOSIS — E119 Type 2 diabetes mellitus without complications: Secondary | ICD-10-CM | POA: Diagnosis not present

## 2024-05-29 DIAGNOSIS — N400 Enlarged prostate without lower urinary tract symptoms: Secondary | ICD-10-CM | POA: Diagnosis not present

## 2024-05-29 DIAGNOSIS — K5730 Diverticulosis of large intestine without perforation or abscess without bleeding: Secondary | ICD-10-CM | POA: Diagnosis not present

## 2024-05-29 LAB — COMPREHENSIVE METABOLIC PANEL WITH GFR
ALT: 27 U/L (ref 0–44)
AST: 28 U/L (ref 15–41)
Albumin: 4.4 g/dL (ref 3.5–5.0)
Alkaline Phosphatase: 54 U/L (ref 38–126)
Anion gap: 14 (ref 5–15)
BUN: <5 mg/dL — ABNORMAL LOW (ref 8–23)
CO2: 26 mmol/L (ref 22–32)
Calcium: 9.7 mg/dL (ref 8.9–10.3)
Chloride: 93 mmol/L — ABNORMAL LOW (ref 98–111)
Creatinine, Ser: 0.79 mg/dL (ref 0.61–1.24)
GFR, Estimated: >60 mL/min (ref >60)
Glucose, Bld: 162 mg/dL — ABNORMAL HIGH (ref 70–99)
Potassium: 3.1 mmol/L — ABNORMAL LOW (ref 3.5–5.1)
Sodium: 133 mmol/L — ABNORMAL LOW (ref 135–145)
Total Bilirubin: 0.7 mg/dL (ref 0.0–1.2)
Total Protein: 6.7 g/dL (ref 6.5–8.1)

## 2024-05-29 LAB — URINALYSIS, ROUTINE W REFLEX MICROSCOPIC
Bilirubin Urine: NEGATIVE
Glucose, UA: NEGATIVE mg/dL
Hgb urine dipstick: NEGATIVE
Ketones, ur: NEGATIVE mg/dL
Leukocytes,Ua: NEGATIVE
Nitrite: NEGATIVE
Protein, ur: NEGATIVE mg/dL
Specific Gravity, Urine: 1.011 (ref 1.005–1.030)
pH: 5 (ref 5.0–8.0)

## 2024-05-29 LAB — CBC
HCT: 45.9 % (ref 39.0–52.0)
Hemoglobin: 16.5 g/dL (ref 13.0–17.0)
MCH: 32 pg (ref 26.0–34.0)
MCHC: 35.9 g/dL (ref 30.0–36.0)
MCV: 89 fL (ref 80.0–100.0)
Platelets: 368 K/uL (ref 150–400)
RBC: 5.16 MIL/uL (ref 4.22–5.81)
RDW: 12.1 % (ref 11.5–15.5)
WBC: 9.8 K/uL (ref 4.0–10.5)
nRBC: 0 % (ref 0.0–0.2)

## 2024-05-29 LAB — LIPASE, BLOOD: Lipase: 28 U/L (ref 11–51)

## 2024-05-29 MED ORDER — IOHEXOL 300 MG/ML  SOLN
100.0000 mL | Freq: Once | INTRAMUSCULAR | Status: AC | PRN
  Administered 2024-05-29: 100 mL via INTRAVENOUS

## 2024-05-29 NOTE — ED Triage Notes (Signed)
 Pt presents with c/o abdominal pain for several weeks. Pt reports a significant hx with his diabetes where his potassium levels have been too low and he's been on medication for same causing abdominal discomfort. Pt reports taking metformin  for his diabetes, has not taken it today.

## 2024-05-29 NOTE — ED Provider Notes (Signed)
 [place] EMERGENCY DEPARTMENT AT [facility] Provider Note   CSN: [id number] Arrival date & time: 05/29/24  1156     Patient presents with: Abdominal Pain   [name] [name] is a 73 y.o. male.   Patient here with abdominal pain.  Nothing makes it worse or better.  History of gallstones.  History of anxiety diabetes hypertension.  Denies any chest pain shortness of breath weakness numbness tingling.  He is had some increased urination.  They have been making some changes to his metformin .  He denies any fever or chills.  Denies any nausea vomit diarrhea.  He is concerned for maybe hernia or gallstones.  The history is provided by the patient.       Prior to Admission medications   Medication Sig Start Date End Date Taking? Authorizing Provider  albuterol  (VENTOLIN  HFA) 108 (90 Base) MCG/ACT inhaler Inhale 2 puffs into the lungs every 6 (six) hours as needed for wheezing or shortness of breath. 11/09/22   Norleen Lynwood ORN, MD  ALPRAZolam  (XANAX ) 0.5 MG tablet Take 1 tablet (0.5 mg total) by mouth 3 (three) times daily as needed. for anxiety 12/09/23   Norleen Lynwood ORN, MD  amLODipine  (NORVASC ) 5 MG tablet Take 1 tablet by mouth once daily 05/15/24   Norleen Lynwood ORN, MD  aspirin  81 MG tablet Take 81 mg by mouth daily.    [provider]  Continuous Glucose Receiver (FREESTYLE LIBRE 3 READER) DEVI 1 Device by Does not apply route daily. 08/24/23   Norleen Lynwood ORN, MD  Continuous Glucose Sensor (FREESTYLE LIBRE 3 SENSOR) MISC Place 1 sensor on the skin every 14 days. Use to check glucose continuously E11.9 08/24/23   Norleen Lynwood ORN, MD  fluticasone  (FLONASE ) 50 MCG/ACT nasal spray Use 2 spray(s) in each nostril once daily 11/11/20   Norleen Lynwood ORN, MD  fluticasone -salmeterol (ADVAIR) 250-50 MCG/ACT AEPB Inhale 1 puff into the lungs in the morning and at bedtime. 11/13/21   Norleen Lynwood ORN, MD  gabapentin  (NEURONTIN ) 300 MG capsule Take 1 capsule (300 mg total) by mouth 3 (three) times  daily. 10/12/23   Norleen Lynwood ORN, MD  losartan  (COZAAR ) 50 MG tablet Take 1 tablet (50 mg total) by mouth daily. 01/06/23   Norleen Lynwood ORN, MD  metFORMIN  (GLUCOPHAGE -XR) 500 MG 24 hr tablet Take 2 tabs by mouth in the AM, and 1 tab in the afternoon for cbg > 150 11/17/23   Norleen Lynwood ORN, MD  omeprazole  (PRILOSEC) 20 MG capsule Take 2 capsules (40 mg total) by mouth daily. 12/29/23   Norleen Lynwood ORN, MD  Potassium Chloride  ER 20 MEQ TBCR Take 1 tablet by mouth 2 (two) times daily.    [provider]  Probiotic Product (PROBIOTIC PO) Take by mouth.    [provider]  sennosides-docusate sodium  (SENOKOT-S) 8.6-50 MG tablet Take 1 tablet by mouth daily.    [provider]  VITAMIN B COMPLEX-C PO Take by mouth.    [provider]  VITAMIN D  PO Take by mouth.    [provider]    Allergies: Cephalexin    Review of Systems  Updated Vital Signs BP (!) 129/90   Pulse 63   Temp 97.8 F (36.6 C) (Oral)   Resp 18   SpO2 94%   Physical Exam Vitals and nursing note reviewed.  Constitutional:      General: He is not in acute distress.    Appearance: He is well-developed. He is  not ill-appearing.  HENT:     Head: Normocephalic and atraumatic.     Mouth/Throat:     Mouth: Mucous membranes are moist.  Eyes:     Extraocular Movements: Extraocular movements intact.     Conjunctiva/sclera: Conjunctivae normal.     Pupils: Pupils are equal, round, and reactive to light.  Cardiovascular:     Rate and Rhythm: Normal rate and regular rhythm.     Heart sounds: Normal heart sounds. No murmur heard. Pulmonary:     Effort: Pulmonary effort is normal. No respiratory distress.     Breath sounds: Normal breath sounds.  Abdominal:     General: Abdomen is flat.     Palpations: Abdomen is soft.     Tenderness: There is generalized abdominal tenderness.  Musculoskeletal:        General: No swelling.     Cervical back: Neck supple.  Skin:    General: Skin is warm  and dry.     Capillary Refill: Capillary refill takes less than 2 seconds.  Neurological:     General: No focal deficit present.     Mental Status: He is alert.  Psychiatric:        Mood and Affect: Mood normal.     (all labs ordered are listed, but only abnormal results are displayed) Labs Reviewed  COMPREHENSIVE METABOLIC PANEL WITH GFR - Abnormal; Notable for the following components:      Result Value   Sodium 133 (*)    Potassium 3.1 (*)    Chloride 93 (*)    Glucose, Bld 162 (*)    BUN <5 (*)    All other components within normal limits  LIPASE, BLOOD  CBC  URINALYSIS, ROUTINE W REFLEX MICROSCOPIC    EKG: None  Radiology: CT ABDOMEN PELVIS W CONTRAST Result Date: 05/29/2024 CLINICAL DATA:  Abdominal pain for several weeks. EXAM: CT ABDOMEN AND PELVIS WITH CONTRAST TECHNIQUE: Multidetector CT imaging of the abdomen and pelvis was performed using the standard protocol following bolus administration of intravenous contrast. RADIATION DOSE REDUCTION: This exam was performed according to the departmental dose-optimization program which includes automated exposure control, adjustment of the mA and/or kV according to patient size and/or use of iterative reconstruction technique. CONTRAST:  OMNIPAQUE  IOHEXOL  300 MG/ML  SOLN COMPARISON:  01/26/2022. Complete abdomen ultrasound dated 03/11/2023. FINDINGS: Lower chest: Normal sized heart.  Clear lung bases. Hepatobiliary: Diffuse low-density of the liver relative to the spleen. Small inferior right lobe liver cyst. Minimal small cyst or hemangioma in the anterior lateral segment left lobe liver. Multiple gallstones in the gallbladder without gallbladder wall thickening or pericholecystic fluid. The largest individual stone measures 2.5 cm. Pancreas: Mild pancreatic atrophy. Spleen: Normal in size without focal abnormality. Adrenals/Urinary Tract: Stable small simple appearing bilateral renal cysts not needing imaging follow-up.  Unremarkable adrenal glands, ureters and urinary bladder. Stomach/Bowel: Multiple diverticula throughout the colon without evidence of diverticulitis. Normal-appearing appendix, stomach and small bowel. Vascular/Lymphatic: No significant vascular findings are present. No enlarged abdominal or pelvic lymph nodes. Reproductive: Moderately enlarged prostate gland. Other: No abdominal wall hernia or abnormality. No abdominopelvic ascites. Musculoskeletal: Stable lumbosacral hardware and solid bone fusion. 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 Stable T12 compression deformity. Minimal bilateral hip degenerative changes. IMPRESSION: 1. No acute abnormality. 2. Cholelithiasis without evidence of cholecystitis. 3. Diffuse hepatic steatosis. 4. Colonic diverticulosis without evidence of diverticulitis. 5. Moderately enlarged prostate gland. Electronically Signed   By: Elspeth Robynn HERO.D.  On: 05/29/2024 17:17     Procedures   Medications Ordered in the ED  iohexol  (OMNIPAQUE ) 300 MG/ML solution 100 mL (100 mLs Intravenous Contrast Given 05/29/24 1640)                                    Medical Decision Making Amount and/or Complexity of Data Reviewed Labs: ordered. Radiology: ordered.  Risk Prescription drug management.   LORIN GAWRON is here with abdominal pain.  Normal vitals.  History of diabetes hypertension.  Differential diagnosis constipation versus gas related process versus less likely gallstones pancreatitis bowel obstruction colitis appendicitis.  Could be UTI.  Will check labs get a CT scan abdomen pelvis.  Per my review interpretation labs potassium 3.1 but otherwise there is no significant leukocytosis anemia or electrolyte abnormality.  Urinalysis negative for infection.  Waiting for abdominal CT.  Overall CT scan shows gallstones but no evidence of cholecystitis.  Overall no major findings on CT.  Will have him follow-up with his primary care  doctor and general surgery regarding gallstones.  Discharged in good condition.  This chart was dictated using voice recognition software.  Despite best efforts to proofread,  errors can occur which can change the documentation meaning.      Final diagnoses:  Abdominal pain, unspecified abdominal location  Gallstones    ED Discharge Orders     None          Ruthe Cornet, DO 05/29/24 1742

## 2024-05-29 NOTE — Discharge Instructions (Signed)
 Return if pain worsens or if you develop fever.  Follow-up with general surgery regarding your gallstones

## 2024-05-31 DIAGNOSIS — B009 Herpesviral infection, unspecified: Secondary | ICD-10-CM | POA: Diagnosis not present

## 2024-05-31 DIAGNOSIS — E6609 Other obesity due to excess calories: Secondary | ICD-10-CM | POA: Diagnosis not present

## 2024-05-31 DIAGNOSIS — F3181 Bipolar II disorder: Secondary | ICD-10-CM | POA: Diagnosis not present

## 2024-05-31 DIAGNOSIS — K760 Fatty (change of) liver, not elsewhere classified: Secondary | ICD-10-CM | POA: Diagnosis not present

## 2024-05-31 DIAGNOSIS — R7989 Other specified abnormal findings of blood chemistry: Secondary | ICD-10-CM | POA: Diagnosis not present

## 2024-05-31 DIAGNOSIS — K219 Gastro-esophageal reflux disease without esophagitis: Secondary | ICD-10-CM | POA: Diagnosis not present

## 2024-05-31 DIAGNOSIS — Z09 Encounter for follow-up examination after completed treatment for conditions other than malignant neoplasm: Secondary | ICD-10-CM | POA: Diagnosis not present

## 2024-05-31 DIAGNOSIS — R945 Abnormal results of liver function studies: Secondary | ICD-10-CM | POA: Diagnosis not present

## 2024-05-31 DIAGNOSIS — E1142 Type 2 diabetes mellitus with diabetic polyneuropathy: Secondary | ICD-10-CM | POA: Diagnosis not present

## 2024-05-31 DIAGNOSIS — K8020 Calculus of gallbladder without cholecystitis without obstruction: Secondary | ICD-10-CM | POA: Diagnosis not present

## 2024-05-31 DIAGNOSIS — F419 Anxiety disorder, unspecified: Secondary | ICD-10-CM | POA: Diagnosis not present

## 2024-06-01 DIAGNOSIS — C44319 Basal cell carcinoma of skin of other parts of face: Secondary | ICD-10-CM | POA: Diagnosis not present

## 2024-06-01 DIAGNOSIS — Z85828 Personal history of other malignant neoplasm of skin: Secondary | ICD-10-CM | POA: Diagnosis not present

## 2024-06-02 DIAGNOSIS — H04123 Dry eye syndrome of bilateral lacrimal glands: Secondary | ICD-10-CM | POA: Diagnosis not present

## 2024-06-08 ENCOUNTER — Encounter: Payer: Self-pay | Admitting: Internal Medicine

## 2024-06-08 ENCOUNTER — Ambulatory Visit: Admitting: Internal Medicine

## 2024-06-08 VITALS — BP 120/84 | HR 86 | Ht 71.0 in | Wt 213.0 lb

## 2024-06-08 DIAGNOSIS — K8020 Calculus of gallbladder without cholecystitis without obstruction: Secondary | ICD-10-CM

## 2024-06-08 DIAGNOSIS — K760 Fatty (change of) liver, not elsewhere classified: Secondary | ICD-10-CM

## 2024-06-08 DIAGNOSIS — K219 Gastro-esophageal reflux disease without esophagitis: Secondary | ICD-10-CM

## 2024-06-08 DIAGNOSIS — Z800 Family history of malignant neoplasm of digestive organs: Secondary | ICD-10-CM

## 2024-06-08 DIAGNOSIS — Z860101 Personal history of adenomatous and serrated colon polyps: Secondary | ICD-10-CM

## 2024-06-08 NOTE — Progress Notes (Signed)
 Patient ID: [name] [name], male   DOB: 10/05/1950, 73 y.o.   MRN: [id number] HPI: [name] [name] is a 73 year old male with a history of colonic polyps, family history of colon cancer in his father, colonic diverticulosis, GERD, fatty liver, gallstones, hypertension, hyperlipidemia, lumbar disc disease who is seen to discuss colonoscopy for polyp surveillance.  He was previously managed by Dr. Aneita.  He is here alone today.  He was last seen here in April 2022.  Colonoscopy on 12/23/2020 revealed 6 polyps ranging in size from 6 to 8 mm.  These were removed and found to be tubular adenomas.  There was mild diverticulosis in the right colon and moderate diverticulosis in the left colon as well as internal hemorrhoids.  A 3-year recall colonoscopy was recommended.  He also sees [place] for his primary care and he has a FibroScan scheduled to evaluate fatty liver on 07/20/2024.  He was in the ER with mild right sided abdominal pain and an ultrasound and CT showed gallstones.  He has been referred to see Dr. Herlene Bureau on 06/14/2024 to evaluate gallstones.  He has noted some intermittent loose stools which he attributes to metformin .  Primary care reduced him from 3 to 2 tablets of metformin  daily.  He will occasionally get constipated and use senna.  He has not seen blood in his stool or melena.  He denies dysphagia and a dyne aphasia.  No nausea or vomiting.  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  He has had prior back surgery.  Past Medical History:  Diagnosis Date   Allergic rhinitis    Allergy    SEASONAL,USE TO TAKE ALLERGY SHOTS   Anxiety    Arthritis    BACK,KNEES   Asthma, mild    Borderline diabetes mellitus    Depression    Diverticulosis of colon    Gallstones    Generalized anxiety disorder    GERD (gastroesophageal reflux disease)    Hemorrhoids    History of colonic polyps    Hyperlipidemia    Hypertension    Lumbar back pain    Obesity     Venous insufficiency     Past Surgical History:  Procedure Laterality Date   COLONOSCOPY  2011   LUMBAR FUSION     LUMBAR LAMINECTOMY  8010,8005   POLYPECTOMY      Outpatient Medications Prior to Visit  Medication Sig Dispense Refill   albuterol  (VENTOLIN  HFA) 108 (90 Base) MCG/ACT inhaler Inhale 2 puffs into the lungs every 6 (six) hours as needed for wheezing or shortness of breath. 1 each 0   amLODipine  (NORVASC ) 5 MG tablet Take 1 tablet by mouth once daily 90 tablet 0   aspirin  81 MG tablet Take 81 mg by mouth daily.     Continuous Glucose Receiver (FREESTYLE LIBRE 3 READER) DEVI 1 Device by Does not apply route daily. 1 each 0   Continuous Glucose Sensor (FREESTYLE LIBRE 3 SENSOR) MISC Place 1 sensor on the skin every 14 days. Use to check glucose continuously E11.9 2 each 11   fluticasone  (FLONASE ) 50 MCG/ACT nasal spray Use 2 spray(s) in each nostril once daily (Patient taking differently: as needed. Use 2 spray(s) in each nostril once daily) 48 g 5   fluticasone -salmeterol (ADVAIR) 250-50 MCG/ACT AEPB Inhale 1 puff into the lungs in the morning and at bedtime. (Patient taking differently: Inhale 1 puff into the lungs 2 (two) times daily as needed.) 180 each  3   losartan  (COZAAR ) 50 MG tablet Take 1 tablet (50 mg total) by mouth daily. 90 tablet 3   metFORMIN  (GLUCOPHAGE -XR) 500 MG 24 hr tablet Take 2 tabs by mouth in the AM, and 1 tab in the afternoon for cbg > 150 (Patient taking differently: patient taking 2 tablets daily) 270 tablet 3   omeprazole  (PRILOSEC) 20 MG capsule Take 2 capsules (40 mg total) by mouth daily. (Patient taking differently: Take 20 mg by mouth daily.) 180 capsule 3   Probiotic Product (PROBIOTIC PO) Take by mouth.     sennosides-docusate sodium  (SENOKOT-S) 8.6-50 MG tablet Take 1 tablet by mouth daily. (Patient taking differently: Take 1 tablet by mouth daily as needed.)     VITAMIN B COMPLEX-C PO Take by mouth.     VITAMIN D  PO Take by mouth.      ALPRAZolam  (XANAX ) 0.5 MG tablet Take 1 tablet (0.5 mg total) by mouth 3 (three) times daily as needed. for anxiety (Patient not taking: Reported on 06/08/2024) 90 tablet 2   gabapentin  (NEURONTIN ) 300 MG capsule Take 1 capsule (300 mg total) by mouth 3 (three) times daily. (Patient not taking: Reported on 06/08/2024) 90 capsule 3   Potassium Chloride  ER 20 MEQ TBCR Take 1 tablet by mouth 2 (two) times daily. (Patient not taking: Reported on 06/08/2024)     No facility-administered medications prior to visit.    Allergies  Allergen Reactions   Cephalexin Itching    Family History  Problem Relation Age of Onset   Stroke Father    Heart attack Father    Colon cancer Father    Diabetes Father    Prostate cancer Father    Lung cancer Mother    Colon cancer Maternal Grandfather    Colon polyps Neg Hx    Esophageal cancer Neg Hx    Stomach cancer Neg Hx    Rectal cancer Neg Hx     Social History   Tobacco Use   Smoking status: Never   Smokeless tobacco: Never  Vaping Use   Vaping status: Never Used  Substance Use Topics   Alcohol use: No   Drug use: No    ROS: As per history of present illness, otherwise negative  BP 120/84   Pulse 86   Ht 5' 11 (1.803 m)   Wt 213 lb (96.6 kg)   SpO2 96%   BMI 29.71 kg/m  Gen: awake, alert, NAD HEENT: anicteric  Neuro: nonfocal   RELEVANT LABS AND IMAGING: Lab work from 05/29/2024: Hemoglobin 16.5, MCV 89, platelet count 368, white count 9.8 AST 28, ALT 27 alkaline phosphatase 54, total bilirubin 0.7, albumin 4.4, BUN less than 5 creatinine 0.79  CT ABDOMEN AND PELVIS WITH CONTRAST   TECHNIQUE: Multidetector CT imaging of the abdomen and pelvis was performed using the standard protocol following bolus administration of intravenous contrast.   RADIATION DOSE REDUCTION: This exam was performed according to the departmental dose-optimization program which includes automated exposure control, adjustment of the mA and/or kV  according to patient size and/or use of iterative reconstruction technique.   CONTRAST:  OMNIPAQUE  IOHEXOL  300 MG/ML  SOLN   COMPARISON:  01/26/2022. Complete abdomen ultrasound dated 03/11/2023.   FINDINGS: Lower chest: Normal sized heart.  Clear lung bases.   Hepatobiliary: Diffuse low-density of the liver relative to the spleen. Small inferior right lobe liver cyst. Minimal small cyst or hemangioma in the anterior lateral segment left lobe liver. Multiple gallstones in the gallbladder without gallbladder  wall thickening or pericholecystic fluid. The largest individual stone measures 2.5 cm.   Pancreas: Mild pancreatic atrophy.   Spleen: Normal in size without focal abnormality.   Adrenals/Urinary Tract: Stable small simple appearing bilateral renal cysts not needing imaging follow-up. Unremarkable adrenal glands, ureters and urinary bladder.   Stomach/Bowel: Multiple diverticula throughout the colon without evidence of diverticulitis. Normal-appearing appendix, stomach and small bowel.   Vascular/Lymphatic: No significant vascular findings are present. No enlarged abdominal or pelvic lymph nodes.   Reproductive: Moderately enlarged prostate gland.   Other: No abdominal wall hernia or abnormality. No abdominopelvic ascites.   Musculoskeletal: Stable lumbosacral hardware and solid bone fusion. 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 Stable T12 compression deformity. Minimal bilateral hip degenerative changes.   IMPRESSION: 1. No acute abnormality. 2. Cholelithiasis without evidence of cholecystitis. 3. Diffuse hepatic steatosis. 4. Colonic diverticulosis without evidence of diverticulitis. 5. Moderately enlarged prostate gland.     Electronically Signed   By: Elspeth Bathe M.D.   On: 05/29/2024 17:17      ASSESSMENT/PLAN: 73 year old male with a history of colonic polyps, family history of colon cancer in his father,  colonic diverticulosis, GERD, fatty liver, gallstones, hypertension, hyperlipidemia, lumbar disc disease who is seen to discuss colonoscopy for polyp surveillance.   Personal history of adenomatous colon polyps and family history of colon cancer --surveillance colonoscopy is recommended at this time.  He had 6 adenomas removed just over 3 years ago. - Colonoscopy in [facility].  We reviewed the risk, benefits and alternatives and he is agreeable and wishes to proceed  2.  GERD --managed by primary care.  Symptoms well-controlled on low-dose omeprazole  20 mg daily.  No alarm symptoms.  3.  Gallstones --intermittent abdominal pain.  No evidence for cholecystitis.  He is seeing general surgery for consult regarding these gallstones next week  4.  Fatty liver --FibroScan ordered by [facility].  I have asked for a copy of this report when done.  No evidence for advanced liver disease or cirrhosis by imaging nor decompensated portal hypertension. - Risk factor modification. - Follow-up FibroScan      Rr:Inboz, Avelina POUR, Pa-c [address] [place],  [place] [address]

## 2024-06-08 NOTE — Patient Instructions (Signed)
 It has been recommended to you by your physician that you have a(n) colonoscopy completed. Per your request, we did not schedule the procedure(s) today. Please contact our office at [phone] should you decide to have the procedure completed. You will be scheduled for a pre-visit and procedure at that time.  _______________________________________________________  If your blood pressure at your visit was 140/90 or greater, please contact your primary care physician to follow up on this.  _______________________________________________________  If you are age 35 or older, your body mass index should be between 23-30. Your Body mass index is 29.71 kg/m. If this is out of the aforementioned range listed, please consider follow up with your Primary Care Provider.  If you are age 40 or younger, your body mass index should be between 19-25. Your Body mass index is 29.71 kg/m. If this is out of the aformentioned range listed, please consider follow up with your Primary Care Provider.   ________________________________________________________  The [facility] providers would like to encourage you to use MYCHART to communicate with providers for non-urgent requests or questions.  Due to long hold times on the telephone, sending your provider a message by [facility] may be a faster and more efficient way to get a response.  Please allow 48 business hours for a response.  Please remember that this is for non-urgent requests.  _______________________________________________________  [facility] is using a team-based approach to care.  Your team is made up of your doctor and two to three APPS. Our APPS (Nurse Practitioners and Physician Assistants) work with your physician to ensure care continuity for you. They are fully qualified to address your health concerns and develop a treatment plan. They communicate directly with your gastroenterologist to care for you. Seeing the Advanced Practice  Practitioners on your physician's team can help you by facilitating care more promptly, often allowing for earlier appointments, access to diagnostic testing, procedures, and other specialty referrals.

## 2024-06-14 ENCOUNTER — Telehealth: Payer: Self-pay | Admitting: Internal Medicine

## 2024-06-14 ENCOUNTER — Encounter: Payer: Self-pay | Admitting: Internal Medicine

## 2024-06-14 DIAGNOSIS — E1169 Type 2 diabetes mellitus with other specified complication: Secondary | ICD-10-CM | POA: Diagnosis not present

## 2024-06-14 DIAGNOSIS — E781 Pure hyperglyceridemia: Secondary | ICD-10-CM | POA: Diagnosis not present

## 2024-06-14 DIAGNOSIS — K8010 Calculus of gallbladder with chronic cholecystitis without obstruction: Secondary | ICD-10-CM | POA: Diagnosis not present

## 2024-06-14 NOTE — Telephone Encounter (Signed)
 Patient requests to know what time frame he must wait following colonoscopy in order to have gallbladder removal. Patient is advised that there is no required wait time between colonoscopy and gallbladder removal is colonoscopy is completed first. Patient verbalizes understanding.

## 2024-06-14 NOTE — Telephone Encounter (Signed)
 Inbound call from patient stating he would like to speak to nurse to see how long does he have to wait after having a colonoscopy to have his gallbladder removed.  Requesting a call back  Please advise  Thank you

## 2024-06-19 DIAGNOSIS — R945 Abnormal results of liver function studies: Secondary | ICD-10-CM | POA: Diagnosis not present

## 2024-06-19 NOTE — Progress Notes (Signed)
 Sent message, via epic in basket, requesting orders in epic from Careers adviser.

## 2024-06-20 NOTE — Progress Notes (Signed)
 PCP - Heron Fear , PA [facility]  Cardiologist - n/a  PPM/ICD -  Device Orders -  Rep Notified -   Chest x-ray - 10-31-23 epic EKG - 10-21-23 epic Stress Test - 10-26-23 epic ECHO -  Cardiac Cath -   Sleep Study - n/a CPAP - n/a  Fasting Blood Sugar - 200 Checks Blood Sugar __3___ times a day  Blood Thinner Instructions:n/a Aspirin  Instructions:81 mg asa    ERAS Protcol - PRE-SURGERY n/a  No orders in epic for preop COVID vaccine -yes  Activity--Able to complete Adl's with No Cp or SOB Anesthesia review: HTN, asthma , DM2, Pt. Stated he had no one to stay with him for 24 hours after anesthesia. And he was working on [employer] for transportation. Notified [facility] Spoke to [employer]. Spoke again to [place] at [facility] she will speak to Dr. Stevie again to let him know pt. Doesn't have anyone to stay with him after surgery.  Patient denies shortness of breath, fever, cough and chest pain at PAT appointment   All instructions explained to the patient, with a verbal understanding of the material. Patient agrees to go over the instructions while at home for a better understanding. Patient also instructed to self quarantine after being tested for COVID-19. The opportunity to ask questions was provided.

## 2024-06-20 NOTE — Patient Instructions (Signed)
 SURGICAL WAITING ROOM VISITATION  Patients having surgery or a procedure may have no more than 2 support people in the waiting area - these visitors may rotate.    Children under the age of 44 must have an adult with them who is not the patient.  Visitors with respiratory illnesses are discouraged from visiting and should remain at home.  If the patient needs to stay at the hospital during part of their recovery, the visitor guidelines for inpatient rooms apply. Pre-op nurse will coordinate an appropriate time for 1 support person to accompany patient in pre-op.  This support person may not rotate.    Please refer to the [facility] website for the visitor guidelines for Inpatients (after your surgery is over and you are in a regular room).       Your procedure is scheduled on: 06-26-24    Report to [facility] Main Entrance    Report to admitting at     0705  AM   Call this number if you have problems the morning of surgery [phone]   Do not eat food :After Midnight.   After Midnight you may have the following liquids until __0620____ AM/    DAY OF SURGERY then nothing by mouth  Water Non-Citrus Juices (without pulp, NO RED-Apple, White grape, White cranberry) Black Coffee (NO MILK/CREAM OR CREAMERS, sugar ok)  Clear Tea (NO MILK/CREAM OR CREAMERS, sugar ok) regular and decaf                             Plain Jell-O (NO RED)                                           Fruit ices (not with fruit pulp, NO RED)                                     Popsicles (NO RED)                                                               Sports drinks like Gatorade (NO RED)                   The day of surgery:  Drink ONE (1) Pre-Surgery  G2 BY  0620  AM the morning of surgery. Drink in one sitting. Do not sip.  This drink was given to you during your hospital  pre-op appointment visit. Nothing else to drink after completing the  Pre-Surgery G2.          If you have  questions, please contact your surgeon's office.   FOLLOW  ANY ADDITIONAL PRE OP INSTRUCTIONS YOU RECEIVED FROM YOUR SURGEON'S OFFICE!!!     Oral Hygiene is also important to reduce your risk of infection.                                    Remember - BRUSH YOUR TEETH THE MORNING OF SURGERY WITH YOUR  REGULAR TOOTHPASTE  DENTURES WILL BE REMOVED PRIOR TO SURGERY PLEASE DO NOT APPLY Poly grip OR ADHESIVES!!!   Do NOT smoke after Midnight   Stop all vitamins and herbal supplements 7 days before surgery.   Take these medicines the morning of surgery with A SIP OF WATER: omeprazole , inhaler if needed and bring rescue inhaler with you, amlodipine    DO NOT TAKE ANY ORAL DIABETIC MEDICATIONS DAY OF YOUR SURGERY  Bring CPAP mask and tubing day of surgery.                              You may not have any metal on your body including hair pins, jewelry, and body piercing             Do not wear make-up, lotions, powders, perfumes/cologne, or deodorant  Do not wear nail polish including gel and S&S, artificial/acrylic nails, or any other type of covering on natural nails including finger and toenails. If you have artificial nails, gel coating, etc. that needs to be removed by a nail salon please have this removed prior to surgery or surgery may need to be canceled/ delayed if the surgeon/ anesthesia feels like they are unable to be safely monitored.   Do not shave  48 hours prior to surgery.                Do not bring valuables to the hospital. [facility] IS NOT             RESPONSIBLE   FOR VALUABLES.   Contacts, glasses, dentures or bridgework may not be worn into surgery.   Bring small overnight bag day of surgery.   DO NOT BRING YOUR HOME MEDICATIONS TO THE HOSPITAL. PHARMACY WILL DISPENSE MEDICATIONS LISTED ON YOUR MEDICATION LIST TO YOU DURING YOUR ADMISSION IN THE HOSPITAL!    Patients discharged on the day of surgery will not be allowed to drive home.  Someone NEEDS to stay  with you for the first 24 hours after anesthesia.   Special Instructions: Bring a copy of your healthcare power of attorney and living will documents the day of surgery if you haven't scanned them before.              Please read over the following fact sheets you were given: IF YOU HAVE QUESTIONS ABOUT YOUR PRE-OP INSTRUCTIONS PLEASE CALL [phone].   . If you test positive for Covid or have been in contact with anyone that has tested positive in the last 10 days please notify you surgeon.    [facility] - Preparing for Surgery Before surgery, you can play an important role.  Because skin is not sterile, your skin needs to be as free of germs as possible.  You can reduce the number of germs on your skin by washing with CHG (chlorahexidine gluconate) soap before surgery.  CHG is an antiseptic cleaner which kills germs and bonds with the skin to continue killing germs even after washing. Please DO NOT use if you have an allergy to CHG or antibacterial soaps.  If your skin becomes reddened/irritated stop using the CHG and inform your nurse when you arrive at Short Stay. Do not shave (including legs and underarms) for at least 48 hours prior to the first CHG shower.  You may shave your face/neck.  Please follow these instructions carefully:  1.  Shower with CHG Soap the night before surgery ONLY (DO NOT USE  THE SOAP THE MORNING OF SURGERY).  2.  If you choose to wash your hair, wash your hair first as usual with your normal  shampoo.  3.  After you shampoo, rinse your hair and body thoroughly to remove the shampoo.                             4.  Use CHG as you would any other liquid soap.  You can apply chg directly to the skin and wash.  Gently with a scrungie or clean washcloth.  5.  Apply the CHG Soap to your body ONLY FROM THE NECK DOWN.   Do  not use on face/ open                           Wound or open sores. Avoid contact with eyes, ears mouth and genitals (private parts).                        Wash face,  Genitals (private parts) with your normal soap.             6.  Wash thoroughly, paying special attention to the area where your  surgery  will be performed.  7.  Thoroughly rinse your body with warm water from the neck down.  8.  DO NOT shower/wash with your normal soap after using and rinsing off the CHG Soap.                9.  Pat yourself dry with a clean towel.            10.  Wear clean pajamas.            11.  Place clean sheets on your bed the night of your first shower and do not  sleep with pets. Day of Surgery : Do not apply any CHG, lotions/deodorants the morning of surgery.  Please wear clean clothes to the hospital/surgery center.  FAILURE TO FOLLOW THESE INSTRUCTIONS MAY RESULT IN THE CANCELLATION OF YOUR SURGERY  PATIENT SIGNATURE_________________________________  NURSE SIGNATURE__________________________________  ________________________________________________________________________

## 2024-06-21 ENCOUNTER — Encounter (HOSPITAL_COMMUNITY): Payer: Self-pay

## 2024-06-21 ENCOUNTER — Encounter (HOSPITAL_COMMUNITY)
Admission: RE | Admit: 2024-06-21 | Discharge: 2024-06-21 | Disposition: A | Discharge status: Home / Self Care | Source: Ambulatory Visit | Attending: General Surgery | Admitting: General Surgery

## 2024-06-21 ENCOUNTER — Other Ambulatory Visit: Payer: Self-pay

## 2024-06-21 VITALS — BP 132/90 | HR 78 | Temp 98.1°F | Resp 16 | Ht 71.0 in | Wt 212.0 lb

## 2024-06-21 DIAGNOSIS — Z01812 Encounter for preprocedural laboratory examination: Secondary | ICD-10-CM | POA: Diagnosis not present

## 2024-06-21 DIAGNOSIS — I10 Essential (primary) hypertension: Secondary | ICD-10-CM | POA: Insufficient documentation

## 2024-06-21 DIAGNOSIS — E119 Type 2 diabetes mellitus without complications: Secondary | ICD-10-CM | POA: Diagnosis not present

## 2024-06-21 HISTORY — DX: Type 2 diabetes mellitus without complications: E11.9

## 2024-06-21 HISTORY — DX: Pneumonia, unspecified organism: J18.9

## 2024-06-21 HISTORY — DX: Myoneural disorder, unspecified: G70.9

## 2024-06-21 LAB — CBC
HCT: 46.4 % (ref 39.0–52.0)
Hemoglobin: 16 g/dL (ref 13.0–17.0)
MCH: 31.3 pg (ref 26.0–34.0)
MCHC: 34.5 g/dL (ref 30.0–36.0)
MCV: 90.6 fL (ref 80.0–100.0)
Platelets: 358 K/uL (ref 150–400)
RBC: 5.12 MIL/uL (ref 4.22–5.81)
RDW: 12.6 % (ref 11.5–15.5)
WBC: 8.8 K/uL (ref 4.0–10.5)
nRBC: 0 % (ref 0.0–0.2)

## 2024-06-21 LAB — BASIC METABOLIC PANEL WITH GFR
Anion gap: 11 (ref 5–15)
BUN: <5 mg/dL — ABNORMAL LOW (ref 8–23)
CO2: 30 mmol/L (ref 22–32)
Calcium: 9.4 mg/dL (ref 8.9–10.3)
Chloride: 99 mmol/L (ref 98–111)
Creatinine, Ser: 0.76 mg/dL (ref 0.61–1.24)
GFR, Estimated: >60 mL/min (ref >60)
Glucose, Bld: 170 mg/dL — ABNORMAL HIGH (ref 70–99)
Potassium: 3.9 mmol/L (ref 3.5–5.1)
Sodium: 139 mmol/L (ref 135–145)

## 2024-06-21 LAB — HEMOGLOBIN A1C
Hgb A1c MFr Bld: 5.3 % (ref 4.8–5.6)
Mean Plasma Glucose: 105.41 mg/dL

## 2024-06-21 LAB — GLUCOSE, CAPILLARY: Glucose-Capillary: 208 mg/dL — ABNORMAL HIGH (ref 70–99)

## 2024-06-25 ENCOUNTER — Encounter (HOSPITAL_COMMUNITY): Payer: Self-pay | Admitting: General Surgery

## 2024-06-25 NOTE — Anesthesia Preprocedure Evaluation (Signed)
 Anesthesia Evaluation  Patient identified by MRN, date of birth, ID band Patient awake    Reviewed: Allergy & Precautions, NPO status , Patient's Chart, lab work & pertinent test results, reviewed documented beta blocker date and time   Airway Mallampati: II  TM Distance: >3 FB     Dental  (+) Caps, Dental Advisory Given, Teeth Intact   Pulmonary asthma , pneumonia, resolved   Pulmonary exam normal breath sounds clear to auscultation       Cardiovascular hypertension, Pt. on medications Normal cardiovascular exam Rhythm:Regular Rate:Normal  EKG 10/21/23 NSR, LAD  Stress Test 10/26/23 Normal stress test   Neuro/Psych  PSYCHIATRIC DISORDERS Anxiety Depression    Diabetic peripheral neuropathy Chronic pain syndrome  Neuromuscular disease    GI/Hepatic Neg liver ROS,GERD  Medicated,,Cholelithiasis with chronic cholecystitis   Endo/Other  diabetes, Well Controlled, Type 2, Oral Hypoglycemic Agents  HLD  Renal/GU   negative genitourinary   Musculoskeletal  (+) Arthritis , Osteoarthritis,  Post laminectomy syndrome S/P Lumbar laminectomy for Spinal stenosis   Abdominal  (+) + obese  Peds  Hematology  (+) Blood dyscrasia, anemia   Anesthesia Other Findings   Reproductive/Obstetrics                              Anesthesia Physical Anesthesia Plan  ASA: 3  Anesthesia Plan: General   Post-op Pain Management: Dilaudid IV, Precedex and Ofirmev  IV (intra-op)*   Induction: Intravenous and Cricoid pressure planned  PONV Risk Score and Plan: 4 or greater and Treatment may vary due to age or medical condition, Ondansetron  and Dexamethasone   Airway Management Planned: Oral ETT  Additional Equipment: None  Intra-op Plan:   Post-operative Plan: Extubation in OR  Informed Consent: I have reviewed the patients History and Physical, chart, labs and discussed the procedure including the risks,  benefits and alternatives for the proposed anesthesia with the patient or authorized representative who has indicated his/her understanding and acceptance.     Dental advisory given  Plan Discussed with: CRNA and Anesthesiologist  Anesthesia Plan Comments:          Anesthesia Quick Evaluation

## 2024-06-26 ENCOUNTER — Ambulatory Visit (HOSPITAL_COMMUNITY): Payer: Self-pay | Admitting: Anesthesiology

## 2024-06-26 ENCOUNTER — Encounter (HOSPITAL_COMMUNITY): Payer: Self-pay | Admitting: General Surgery

## 2024-06-26 ENCOUNTER — Ambulatory Visit (HOSPITAL_COMMUNITY): Payer: Self-pay | Admitting: Physician Assistant

## 2024-06-26 ENCOUNTER — Other Ambulatory Visit: Payer: Self-pay

## 2024-06-26 ENCOUNTER — Encounter (HOSPITAL_COMMUNITY)
Admission: RE | Disposition: A | Discharge status: Home / Self Care | Payer: Self-pay | Source: Home / Self Care | Attending: General Surgery

## 2024-06-26 ENCOUNTER — Observation Stay (HOSPITAL_COMMUNITY)
Admission: RE | Admit: 2024-06-26 | Discharge: 2024-06-27 | Disposition: A | Discharge status: Home / Self Care | Attending: General Surgery | Admitting: General Surgery

## 2024-06-26 DIAGNOSIS — Z7982 Long term (current) use of aspirin: Secondary | ICD-10-CM | POA: Diagnosis not present

## 2024-06-26 DIAGNOSIS — E119 Type 2 diabetes mellitus without complications: Secondary | ICD-10-CM | POA: Insufficient documentation

## 2024-06-26 DIAGNOSIS — E782 Mixed hyperlipidemia: Secondary | ICD-10-CM | POA: Diagnosis not present

## 2024-06-26 DIAGNOSIS — I10 Essential (primary) hypertension: Secondary | ICD-10-CM | POA: Diagnosis not present

## 2024-06-26 DIAGNOSIS — J45909 Unspecified asthma, uncomplicated: Secondary | ICD-10-CM | POA: Insufficient documentation

## 2024-06-26 DIAGNOSIS — F418 Other specified anxiety disorders: Secondary | ICD-10-CM | POA: Diagnosis not present

## 2024-06-26 DIAGNOSIS — K8010 Calculus of gallbladder with chronic cholecystitis without obstruction: Secondary | ICD-10-CM

## 2024-06-26 DIAGNOSIS — Z79899 Other long term (current) drug therapy: Secondary | ICD-10-CM | POA: Diagnosis not present

## 2024-06-26 DIAGNOSIS — R109 Unspecified abdominal pain: Secondary | ICD-10-CM | POA: Diagnosis present

## 2024-06-26 DIAGNOSIS — Z7984 Long term (current) use of oral hypoglycemic drugs: Secondary | ICD-10-CM | POA: Insufficient documentation

## 2024-06-26 DIAGNOSIS — K8011 Calculus of gallbladder with chronic cholecystitis with obstruction: Secondary | ICD-10-CM | POA: Diagnosis not present

## 2024-06-26 HISTORY — PX: CHOLECYSTECTOMY: SHX55

## 2024-06-26 LAB — CBC
HCT: 47.9 % (ref 39.0–52.0)
Hemoglobin: 16.4 g/dL (ref 13.0–17.0)
MCH: 31.5 pg (ref 26.0–34.0)
MCHC: 34.2 g/dL (ref 30.0–36.0)
MCV: 92.1 fL (ref 80.0–100.0)
Platelets: 307 K/uL (ref 150–400)
RBC: 5.2 MIL/uL (ref 4.22–5.81)
RDW: 12.6 % (ref 11.5–15.5)
WBC: 17.9 K/uL — ABNORMAL HIGH (ref 4.0–10.5)
nRBC: 0 % (ref 0.0–0.2)

## 2024-06-26 LAB — GLUCOSE, CAPILLARY
Glucose-Capillary: 161 mg/dL — ABNORMAL HIGH (ref 70–99)
Glucose-Capillary: 220 mg/dL — ABNORMAL HIGH (ref 70–99)
Glucose-Capillary: 255 mg/dL — ABNORMAL HIGH (ref 70–99)
Glucose-Capillary: 239 mg/dL — ABNORMAL HIGH (ref 70–99)

## 2024-06-26 LAB — CREATININE, SERUM
Creatinine, Ser: 0.72 mg/dL (ref 0.61–1.24)
GFR, Estimated: >60 mL/min (ref >60)

## 2024-06-26 SURGERY — LAPAROSCOPIC CHOLECYSTECTOMY
Anesthesia: General

## 2024-06-26 MED ORDER — SIMETHICONE 80 MG PO CHEW: 40.0000 mg | CHEWABLE_TABLET | Freq: Four times a day (QID) | ORAL | Status: DC | PRN

## 2024-06-26 MED ORDER — OXYCODONE HCL 5 MG PO TABS
ORAL_TABLET | ORAL | Status: AC
  Filled 2024-06-26: qty 1

## 2024-06-26 MED ORDER — OXYCODONE HCL 5 MG PO TABS: 5.0000 mg | ORAL_TABLET | Freq: Four times a day (QID) | ORAL | Status: DC | PRN

## 2024-06-26 MED ORDER — CHLORHEXIDINE GLUCONATE 0.12 % MT SOLN
15.0000 mL | Freq: Once | OROMUCOSAL | Status: AC
  Administered 2024-06-26: 15 mL via OROMUCOSAL

## 2024-06-26 MED ORDER — FENTANYL CITRATE (PF) 100 MCG/2ML IJ SOLN
INTRAMUSCULAR | Status: AC
  Filled 2024-06-26: qty 2

## 2024-06-26 MED ORDER — PHENYLEPHRINE 80 MCG/ML (10ML) SYRINGE FOR IV PUSH (FOR BLOOD PRESSURE SUPPORT)
PREFILLED_SYRINGE | INTRAVENOUS | Status: AC
  Filled 2024-06-26: qty 10

## 2024-06-26 MED ORDER — ROCURONIUM BROMIDE 10 MG/ML (PF) SYRINGE
PREFILLED_SYRINGE | INTRAVENOUS | Status: DC | PRN
  Administered 2024-06-26: 10 mg via INTRAVENOUS
  Administered 2024-06-26: 60 mg via INTRAVENOUS

## 2024-06-26 MED ORDER — CHLORHEXIDINE GLUCONATE CLOTH 2 % EX PADS: 6.0000 | MEDICATED_PAD | Freq: Once | CUTANEOUS | Status: DC

## 2024-06-26 MED ORDER — PHENYLEPHRINE 80 MCG/ML (10ML) SYRINGE FOR IV PUSH (FOR BLOOD PRESSURE SUPPORT)
PREFILLED_SYRINGE | INTRAVENOUS | Status: DC | PRN
  Administered 2024-06-26: 160 ug via INTRAVENOUS
  Administered 2024-06-26 (×5): 80 ug via INTRAVENOUS

## 2024-06-26 MED ORDER — STERILE WATER FOR IRRIGATION IR SOLN
Status: DC | PRN
  Administered 2024-06-26: 1000 mL

## 2024-06-26 MED ORDER — LIDOCAINE HCL (PF) 2 % IJ SOLN
INTRAMUSCULAR | Status: AC
  Filled 2024-06-26: qty 5

## 2024-06-26 MED ORDER — METOPROLOL TARTRATE 5 MG/5ML IV SOLN: 5.0000 mg | Freq: Four times a day (QID) | INTRAVENOUS | Status: DC | PRN

## 2024-06-26 MED ORDER — INSULIN ASPART 100 UNIT/ML IJ SOLN: 0.0000 [IU] | INTRAMUSCULAR | Status: DC | PRN

## 2024-06-26 MED ORDER — PROPOFOL 10 MG/ML IV BOLUS
INTRAVENOUS | Status: DC | PRN
  Administered 2024-06-26: 20 mg via INTRAVENOUS
  Administered 2024-06-26: 170 mg via INTRAVENOUS
  Administered 2024-06-26: 30 mg via INTRAVENOUS

## 2024-06-26 MED ORDER — AMLODIPINE BESYLATE 5 MG PO TABS
5.0000 mg | ORAL_TABLET | Freq: Every day | ORAL | Status: DC
Start: 2024-06-26 — End: 2024-06-27
  Administered 2024-06-26 – 2024-06-27 (×2): 5 mg via ORAL
  Filled 2024-06-26 (×2): qty 1

## 2024-06-26 MED ORDER — DEXMEDETOMIDINE HCL IN NACL 80 MCG/20ML IV SOLN
INTRAVENOUS | Status: DC | PRN
  Administered 2024-06-26: 4 ug via INTRAVENOUS
  Administered 2024-06-26 (×2): 8 ug via INTRAVENOUS

## 2024-06-26 MED ORDER — DROPERIDOL 2.5 MG/ML IJ SOLN
0.6250 mg | Freq: Once | INTRAMUSCULAR | Status: AC | PRN
  Administered 2024-06-26: 0.625 mg via INTRAVENOUS

## 2024-06-26 MED ORDER — LIDOCAINE 2% (20 MG/ML) 5 ML SYRINGE
INTRAMUSCULAR | Status: DC | PRN
  Administered 2024-06-26: 60 mg via INTRAVENOUS

## 2024-06-26 MED ORDER — POTASSIUM CHLORIDE ER 20 MEQ PO TBCR: 1.0000 | EXTENDED_RELEASE_TABLET | Freq: Two times a day (BID) | ORAL | Status: DC

## 2024-06-26 MED ORDER — ASPIRIN 81 MG PO TBEC
81.0000 mg | DELAYED_RELEASE_TABLET | Freq: Every day | ORAL | Status: DC
  Administered 2024-06-26 – 2024-06-27 (×2): 81 mg via ORAL
  Filled 2024-06-26 (×2): qty 1

## 2024-06-26 MED ORDER — INSULIN ASPART 100 UNIT/ML IJ SOLN
0.0000 [IU] | Freq: Three times a day (TID) | INTRAMUSCULAR | Status: DC
  Administered 2024-06-26: 8 [IU] via SUBCUTANEOUS

## 2024-06-26 MED ORDER — ACETAMINOPHEN 500 MG PO TABS
1000.0000 mg | ORAL_TABLET | Freq: Four times a day (QID) | ORAL | Status: DC
  Administered 2024-06-26 – 2024-06-27 (×3): 1000 mg via ORAL
  Filled 2024-06-26 (×4): qty 2

## 2024-06-26 MED ORDER — FLUTICASONE PROPIONATE 50 MCG/ACT NA SUSP: 2.0000 | Freq: Every day | NASAL | Status: DC | PRN

## 2024-06-26 MED ORDER — OXYCODONE HCL 5 MG PO TABS
5.0000 mg | ORAL_TABLET | Freq: Once | ORAL | Status: AC | PRN
  Administered 2024-06-26: 5 mg via ORAL

## 2024-06-26 MED ORDER — 0.9 % SODIUM CHLORIDE (POUR BTL) OPTIME
TOPICAL | Status: DC | PRN
  Administered 2024-06-26: 1000 mL

## 2024-06-26 MED ORDER — SUGAMMADEX SODIUM 200 MG/2ML IV SOLN
INTRAVENOUS | Status: DC | PRN
  Administered 2024-06-26: 200 mg via INTRAVENOUS

## 2024-06-26 MED ORDER — LACTATED RINGERS IR SOLN
Status: DC | PRN
  Administered 2024-06-26: 1000 mL

## 2024-06-26 MED ORDER — ONDANSETRON HCL 4 MG/2ML IJ SOLN: 4.0000 mg | Freq: Once | INTRAMUSCULAR | Status: DC | PRN

## 2024-06-26 MED ORDER — EPHEDRINE 5 MG/ML INJ
INTRAVENOUS | Status: AC
Start: 2024-06-26 — End: 2024-06-26
  Filled 2024-06-26: qty 5

## 2024-06-26 MED ORDER — BUPIVACAINE-EPINEPHRINE 0.25% -1:200000 IJ SOLN
INTRAMUSCULAR | Status: DC | PRN
  Administered 2024-06-26: 30 mL

## 2024-06-26 MED ORDER — ALBUTEROL SULFATE (2.5 MG/3ML) 0.083% IN NEBU: 3.0000 mL | INHALATION_SOLUTION | Freq: Four times a day (QID) | RESPIRATORY_TRACT | Status: DC | PRN

## 2024-06-26 MED ORDER — PANTOPRAZOLE SODIUM 40 MG PO TBEC
40.0000 mg | DELAYED_RELEASE_TABLET | Freq: Every day | ORAL | Status: DC
  Filled 2024-06-26: qty 1

## 2024-06-26 MED ORDER — PROPOFOL 10 MG/ML IV BOLUS
INTRAVENOUS | Status: AC
  Filled 2024-06-26: qty 20

## 2024-06-26 MED ORDER — ONDANSETRON HCL 4 MG/2ML IJ SOLN: 4.0000 mg | Freq: Four times a day (QID) | INTRAMUSCULAR | Status: DC | PRN

## 2024-06-26 MED ORDER — HYDROMORPHONE HCL 1 MG/ML IJ SOLN
0.2500 mg | INTRAMUSCULAR | Status: DC | PRN
  Administered 2024-06-26 (×4): 0.5 mg via INTRAVENOUS

## 2024-06-26 MED ORDER — BUPIVACAINE-EPINEPHRINE (PF) 0.25% -1:200000 IJ SOLN
INTRAMUSCULAR | Status: AC
  Filled 2024-06-26: qty 30

## 2024-06-26 MED ORDER — DIPHENHYDRAMINE HCL 50 MG/ML IJ SOLN: 12.5000 mg | Freq: Four times a day (QID) | INTRAMUSCULAR | Status: DC | PRN

## 2024-06-26 MED ORDER — FLUTICASONE FUROATE-VILANTEROL 200-25 MCG/ACT IN AEPB: 1.0000 | INHALATION_SPRAY | Freq: Every day | RESPIRATORY_TRACT | Status: DC

## 2024-06-26 MED ORDER — ONDANSETRON HCL 4 MG/2ML IJ SOLN
INTRAMUSCULAR | Status: DC | PRN
Start: 2024-06-26 — End: 2024-06-26
  Administered 2024-06-26: 4 mg via INTRAVENOUS

## 2024-06-26 MED ORDER — ALPRAZOLAM 0.5 MG PO TABS
0.5000 mg | ORAL_TABLET | Freq: Three times a day (TID) | ORAL | Status: DC | PRN
Start: 2024-06-26 — End: 2024-06-26

## 2024-06-26 MED ORDER — LOSARTAN POTASSIUM 50 MG PO TABS
50.0000 mg | ORAL_TABLET | Freq: Every day | ORAL | Status: DC
  Administered 2024-06-26 – 2024-06-27 (×2): 50 mg via ORAL
  Filled 2024-06-26 (×2): qty 1

## 2024-06-26 MED ORDER — ONDANSETRON 4 MG PO TBDP: 4.0000 mg | ORAL_TABLET | Freq: Four times a day (QID) | ORAL | Status: DC | PRN

## 2024-06-26 MED ORDER — FENTANYL CITRATE (PF) 100 MCG/2ML IJ SOLN
INTRAMUSCULAR | Status: DC | PRN
  Administered 2024-06-26: 100 ug via INTRAVENOUS
  Administered 2024-06-26 (×2): 50 ug via INTRAVENOUS

## 2024-06-26 MED ORDER — TRAMADOL HCL 50 MG PO TABS
50.0000 mg | ORAL_TABLET | Freq: Four times a day (QID) | ORAL | Status: DC | PRN
  Administered 2024-06-26 – 2024-06-27 (×3): 50 mg via ORAL
  Filled 2024-06-26 (×3): qty 1

## 2024-06-26 MED ORDER — INSULIN ASPART 100 UNIT/ML IJ SOLN
INTRAMUSCULAR | Status: AC
  Filled 2024-06-26: qty 1

## 2024-06-26 MED ORDER — DIPHENHYDRAMINE HCL 12.5 MG/5ML PO ELIX: 12.5000 mg | ORAL_SOLUTION | Freq: Four times a day (QID) | ORAL | Status: DC | PRN

## 2024-06-26 MED ORDER — HYDROMORPHONE HCL 1 MG/ML IJ SOLN
INTRAMUSCULAR | Status: AC
  Filled 2024-06-26: qty 2

## 2024-06-26 MED ORDER — CIPROFLOXACIN IN D5W 400 MG/200ML IV SOLN
400.0000 mg | INTRAVENOUS | Status: AC
  Administered 2024-06-26: 400 mg via INTRAVENOUS
  Filled 2024-06-26: qty 200

## 2024-06-26 MED ORDER — SENNOSIDES-DOCUSATE SODIUM 8.6-50 MG PO TABS: 1.0000 | ORAL_TABLET | Freq: Every day | ORAL | Status: DC | PRN

## 2024-06-26 MED ORDER — ONDANSETRON HCL 4 MG/2ML IJ SOLN
INTRAMUSCULAR | Status: AC
Start: 2024-06-26 — End: 2024-06-26
  Filled 2024-06-26: qty 2

## 2024-06-26 MED ORDER — SUGAMMADEX SODIUM 200 MG/2ML IV SOLN
INTRAVENOUS | Status: AC
  Filled 2024-06-26: qty 2

## 2024-06-26 MED ORDER — GLYCOPYRROLATE 0.2 MG/ML IJ SOLN
INTRAMUSCULAR | Status: AC
  Filled 2024-06-26: qty 1

## 2024-06-26 MED ORDER — DROPERIDOL 2.5 MG/ML IJ SOLN
INTRAMUSCULAR | Status: AC
Start: 2024-06-26 — End: 2024-06-26
  Filled 2024-06-26: qty 2

## 2024-06-26 MED ORDER — METFORMIN HCL ER 500 MG PO TB24
500.0000 mg | ORAL_TABLET | Freq: Two times a day (BID) | ORAL | Status: DC
  Administered 2024-06-26 – 2024-06-27 (×2): 500 mg via ORAL
  Filled 2024-06-26 (×2): qty 1

## 2024-06-26 MED ORDER — DEXAMETHASONE SOD PHOSPHATE PF 10 MG/ML IJ SOLN
INTRAMUSCULAR | Status: DC | PRN
  Administered 2024-06-26: 5 mg via INTRAVENOUS

## 2024-06-26 MED ORDER — OXYCODONE HCL 5 MG/5ML PO SOLN: 5.0000 mg | Freq: Once | ORAL | Status: AC | PRN

## 2024-06-26 MED ORDER — ENOXAPARIN SODIUM 40 MG/0.4ML IJ SOSY: 40.0000 mg | PREFILLED_SYRINGE | INTRAMUSCULAR | Status: DC

## 2024-06-26 MED ORDER — LACTATED RINGERS IV SOLN: INTRAVENOUS | Status: DC

## 2024-06-26 MED ORDER — EPHEDRINE 5 MG/ML INJ
INTRAVENOUS | Status: AC
  Filled 2024-06-26: qty 5

## 2024-06-26 MED ORDER — ORAL CARE MOUTH RINSE: 15.0000 mL | Freq: Once | OROMUCOSAL | Status: AC

## 2024-06-26 MED ORDER — FREESTYLE LIBRE 3 READER DEVI: 1.0000 | Freq: Every day | Status: DC

## 2024-06-26 MED ORDER — ACETAMINOPHEN 500 MG PO TABS
1000.0000 mg | ORAL_TABLET | ORAL | Status: AC
  Administered 2024-06-26: 1000 mg via ORAL
  Filled 2024-06-26: qty 2

## 2024-06-26 MED ORDER — INSULIN ASPART 100 UNIT/ML IJ SOLN
4.0000 [IU] | Freq: Once | INTRAMUSCULAR | Status: AC
  Administered 2024-06-26: 4 [IU] via SUBCUTANEOUS

## 2024-06-26 MED ORDER — GABAPENTIN 100 MG PO CAPS: 300.0000 mg | ORAL_CAPSULE | Freq: Three times a day (TID) | ORAL | Status: DC

## 2024-06-26 MED ORDER — VITAMIN D 25 MCG (1000 UNIT) PO TABS
1000.0000 [IU] | ORAL_TABLET | Freq: Every day | ORAL | Status: DC
  Administered 2024-06-27: 1000 [IU] via ORAL
  Filled 2024-06-26: qty 1

## 2024-06-26 SURGICAL SUPPLY — 39 items
BAG COUNTER SPONGE SURGICOUNT (BAG) IMPLANT
BENZOIN TINCTURE PRP APPL 2/3 (GAUZE/BANDAGES/DRESSINGS) IMPLANT
BNDG ADH 1X3 SHEER STRL LF (GAUZE/BANDAGES/DRESSINGS) IMPLANT
CABLE HIGH FREQUENCY MONO STRZ (ELECTRODE) ×1 IMPLANT
CHLORAPREP W/TINT 26 (MISCELLANEOUS) ×1 IMPLANT
CLIP APPLIE 5 13 M/L LIGAMAX5 (MISCELLANEOUS) IMPLANT
CLIP APPLIE ROT 10 11.4 M/L (STAPLE) IMPLANT
CLIP LIGATING HEM O LOK PURPLE (MISCELLANEOUS) IMPLANT
CLIP LIGATING HEMO O LOK GREEN (MISCELLANEOUS) ×1 IMPLANT
COVER MAYO STAND XLG (MISCELLANEOUS) ×1 IMPLANT
COVER SURGICAL LIGHT HANDLE (MISCELLANEOUS) ×1 IMPLANT
DRAIN CHANNEL 19F RND (DRAIN) IMPLANT
DRAPE C-ARM 42X120 X-RAY (DRAPES) IMPLANT
ELECT REM PT RETURN 15FT ADLT (MISCELLANEOUS) ×1 IMPLANT
ENDOLOOP SUT PDS II 0 18 (SUTURE) IMPLANT
EVACUATOR SILICONE 100CC (DRAIN) IMPLANT
GLOVE BIOGEL PI IND STRL 7.0 (GLOVE) ×1 IMPLANT
GLOVE SURG SS PI 7.0 STRL IVOR (GLOVE) ×1 IMPLANT
GOWN STRL REUS W/ TWL LRG LVL3 (GOWN DISPOSABLE) ×1 IMPLANT
GRASPER SUT TROCAR 14GX15 (MISCELLANEOUS) IMPLANT
IRRIGATION SUCT STRKRFLW 2 WTP (MISCELLANEOUS) ×1 IMPLANT
KIT BASIN OR (CUSTOM PROCEDURE TRAY) ×1 IMPLANT
KIT TURNOVER KIT A (KITS) ×1 IMPLANT
NDL HYPO 22X1.5 SAFETY MO (MISCELLANEOUS) ×1 IMPLANT
NEEDLE HYPO 22X1.5 SAFETY MO (MISCELLANEOUS) ×1 IMPLANT
POUCH RETRIEVAL ECOSAC 10 (ENDOMECHANICALS) ×1 IMPLANT
SCISSORS LAP 5X35 DISP (ENDOMECHANICALS) ×1 IMPLANT
SET CHOLANGIOGRAPH MIX (MISCELLANEOUS) IMPLANT
SET TUBE SMOKE EVAC HIGH FLOW (TUBING) ×1 IMPLANT
SLEEVE Z-THREAD 5X100MM (TROCAR) ×2 IMPLANT
SPIKE FLUID TRANSFER (MISCELLANEOUS) ×1 IMPLANT
STRIP CLOSURE SKIN 1/2X4 (GAUZE/BANDAGES/DRESSINGS) IMPLANT
SUT ETHILON 2 0 PS N (SUTURE) IMPLANT
SUT MNCRL AB 4-0 PS2 18 (SUTURE) ×1 IMPLANT
SUT VICRYL 0 UR6 27IN ABS (SUTURE) ×1 IMPLANT
TOWEL OR 17X26 10 PK STRL BLUE (TOWEL DISPOSABLE) ×1 IMPLANT
TRAY LAPAROSCOPIC (CUSTOM PROCEDURE TRAY) ×1 IMPLANT
TROCAR Z THREAD OPTICAL 12X100 (TROCAR) ×1 IMPLANT
TROCAR Z-THREAD OPTICAL 5X100M (TROCAR) ×1 IMPLANT

## 2024-06-26 NOTE — Op Note (Signed)
 PATIENT:  [name] [name]  73 y.o. male  PRE-OPERATIVE DIAGNOSIS:  Calculus of gallbladder with chronic cholecystitis without obstruction  POST-OPERATIVE DIAGNOSIS:  Calculus of gallbladder with chronic cholecystitis without obstruction  PROCEDURE:  Procedure(s): LAPAROSCOPIC CHOLECYSTECTOMY   SURGEON:  Zyionna Pesce, Herlene Righter, MD   ASSISTANT: Larnell Costain, M.D.  ANESTHESIA:   local and general  Indications for procedure: [name] [name] is a 73 y.o. male with symptoms of Abdominal pain and Nausea and vomiting consistent with gallbladder disease, Confirmed by ultrasound.  Description of procedure: The patient was brought into the operative suite, placed supine. Anesthesia was administered with endotracheal tube. Patient was strapped in place and foot board was secured. All pressure points were offloaded by foam padding. The patient was prepped and draped in the usual sterile fashion.  A periumbilical incision was made and optical entry was used to enter the abdomen. 2 5 mm trocars were placed on in the right lateral space on in the right subcostal space. A 12mm trocar was placed in the subxiphoid space. Marcaine was infused to the subxiphoid space and lateral upper right abdomen in the transversus abdominis plane. Next the patient was placed in reverse trendelenberg. The gallbladder appeareddilated and chronically inflamed. Omentum was adhered to the gallbladder and was taken down with cautery/blunt dissection.  The gallbladder was retracted cephalad and lateral. The peritoneum was reflected off the infundibulum working lateral to medial. The cystic duct and cystic artery were identified and further dissection revealed a critical view. The cystic duct and cystic artery were doubly clipped and ligated.   The gallbladder was removed off the liver bed with cautery. The Gallbladder was placed in a specimen bag. The gallbladder fossa was irrigated and hemostasis was applied with cautery. The  gallbladder was removed via the 12mm trocar. The fascial defect was closed with interrupted 0 vicryl suture via laparoscopic trans-fascial suture passer. Pneumoperitoneum was removed, all trocar were removed. All incisions were closed with 4-0 monocryl subcuticular stitch. The patient woke from anesthesia and was brought to PACU in stable condition. All counts were correct  Findings: chronic calculous cholecystitis  Specimen: gallbladder  Blood loss: 20 ml  Local anesthesia: 30 ml Marcaine  Complications: none  PLAN OF CARE: Admit for overnight observation  PATIENT DISPOSITION:  PACU - hemodynamically stable.  Herlene Righter [facility], [place]

## 2024-06-26 NOTE — H&P (Signed)
 Chief Complaint  Patient presents with  New Consultation  Eval of Cholelithiasis without evidence of cholecystitis   Subjective   [name] [name] is a 73 y.o. male new patient in today for: History of Present Illness [name] [name] is a 73 year old male with gallstones who presents with abdominal pain and diarrhea. He was referred by Dr. Alma for evaluation of gallstones.  He has persistent abdominal pain and was found to have gallstones last year, with the largest measuring 20 millimeters. He experiences significant gastrointestinal distress, which he believes is exacerbated by metformin . He has frequent diarrhea, with about a dozen incidents in the last month leading to accidents. He has reduced his metformin  dosage from three to two pills to manage these symptoms.  He is scheduled for a follow-up colonoscopy and has upcoming tests for his liver due to high triglycerides. He is concerned about potential liver issues.  He reports dietary challenges, often consuming a sausage biscuit to take his medications. He has difficulty managing his diet and is the sole person responsible for his meals.  Social Drivers of Health with Concerns   Housing Stability: Unknown (06/14/2024)  Housing Stability Vital Sign  Homeless in the Last Year: No    No data to display      Outpatient Medications Prior to Visit  Medication Sig Dispense Refill  albuterol  MDI, PROVENTIL , VENTOLIN , PROAIR , HFA 90 mcg/actuation inhaler Inhale 2 inhalations into the lungs every 6 (six) hours as needed for Wheezing  ALPRAZolam  (XANAX ) 0.5 MG tablet Take 0.5 mg by mouth 3 (three) times daily as needed for Anxiety  amLODIPine  (NORVASC ) 5 MG tablet Take 5 mg by mouth once daily  aspirin  81 MG EC tablet 1 tablet Orally Once a day  blood-glucose sensor (FREESTYLE LIBRE 3 SENSOR) PLACE 1 SENSOR ON THE SKIN EVERY 14 DAYS  cholecalciferol (VITAMIN D3) 1000 unit capsule Take 1,000 Units by mouth once daily  cyanocobalamin   (VITAMIN B12) 100 MCG tablet 1 tablet Orally once daily  cyclobenzaprine  (FLEXERIL ) 5 MG tablet Take 5 mg by mouth 2 (two) times daily as needed  fluticasone  propion-salmeteroL (ADVAIR DISKUS) 250-50 mcg/dose diskus inhaler Inhale 1 Puff into the lungs  gabapentin  (NEURONTIN ) 300 MG capsule Take 300 mg by mouth 3 (three) times daily  losartan  (COZAAR ) 50 MG tablet Take 50 mg by mouth once daily  metFORMIN  (GLUCOPHAGE -XR) 500 MG XR tablet Take 2 tabs by mouth in the AM, and 1 tab in the afternoon for cbg > 150  omeprazole  (PRILOSEC) 40 MG DR capsule Take 40 mg by mouth once daily  potassium chloride  (K-TAB ) 20 mEq TbER ER tablet Take 20 mEq by mouth 2 (two) times daily  Saccharomyces boulardii (FLORASTOR) 250 mg capsule 1 capsule  sennosides (SENOKOT) 8.6 mg tablet 1 tablet Orally Once a day   No facility-administered medications prior to visit.    Objective   Vitals:  06/14/24 1052 06/14/24 1054  BP: 121/86  Pulse: 74  Resp: 18  Temp: 36.9 C (98.5 F)  SpO2: 98%  Weight: 96.9 kg (213 lb 9.6 oz)  Height: 190.5 cm (6' 3)  PainSc: 3  PainLoc: Abdomen   Body mass index is 26.7 kg/m. Physical Exam Constitutional:  Appearance: Normal appearance.  HENT:  Head: Normocephalic and atraumatic.  Pulmonary:  Effort: Pulmonary effort is normal.  Musculoskeletal:  General: Normal range of motion.  Cervical back: Normal range of motion.  Neurological:  General: No focal deficit present.  Mental Status: He is alert and oriented  to person, place, and time. Mental status is at baseline.  Comments: Borderline pressured speech, tangential conversation  Psychiatric:  Mood and Affect: Mood normal.  Behavior: Behavior normal.  Thought Content: Thought content normal.    I reviewed [name] [name]'s notes. I reviewed CT scan images showing large gallstones without CBD dilation or gallbladder wall thickening. I reviewed labs showing no leukocytosis or LFT abnormalities  Assessment/Plan:    Assessment & Plan Symptomatic cholelithiasis He has a 24 mm gallstone causing persistent abdominal pain, likely due to gallbladder obstruction. Surgery is recommended to prevent complications. - Recommend laparoscopic cholecystectomy within three months. - We discussed the etiology of gallstones and probably cause pain. We discussed exacerbating factors including fatty meals. We discussed the details of surgery for removal of the gallbladder including general anesthesia, 4 small incisions in the patient's abdomen, removal of the patient's gallbladder with the liver and common bile duct, and most likely outpatient procedure. We discussed risks of common bile duct injury, cystic duct stump leak, injury to liver, bleeding, infection, need for open procedure, and post cholecystectomy syndrome. The patient showed good understanding and wanted to proceed with laparoscopic cholecystectomy.   - Schedule surgery at [facility]. - Arrange post-operative support, including potential home nursing care.  Diagnoses and all orders for this visit:  Calculus of gallbladder with chronic cholecystitis without obstruction  Hypertriglyceridemia  Type 2 diabetes mellitus with other specified complication, without long-term current use of insulin (CMS/HHS-HCC)

## 2024-06-26 NOTE — Anesthesia Postprocedure Evaluation (Signed)
 Anesthesia Post Note  Patient: [name] [name]  Procedure(s) Performed: LAPAROSCOPIC CHOLECYSTECTOMY     Patient location during evaluation: PACU Anesthesia Type: General Level of consciousness: awake and alert and oriented Pain management: pain level controlled Vital Signs Assessment: post-procedure vital signs reviewed and stable Respiratory status: spontaneous breathing, nonlabored ventilation and respiratory function stable Cardiovascular status: blood pressure returned to baseline and stable Postop Assessment: no apparent nausea or vomiting Anesthetic complications: no   No notable events documented.  Last Vitals:  Vitals:   06/26/24 1230 06/26/24 1245  BP: (!) 148/85 (!) 145/78  Pulse: 63 68  Resp: 16 20  Temp:    SpO2: 94% 94%    Last Pain:  Vitals:   06/26/24 1250  TempSrc:   PainSc: 5                  Nevena Rozenberg A.

## 2024-06-26 NOTE — Transfer of Care (Signed)
 Immediate Anesthesia Transfer of Care Note  Patient: [name] [name]  Procedure(s) Performed: LAPAROSCOPIC CHOLECYSTECTOMY  Patient Location: PACU  Anesthesia Type:General  Level of Consciousness: awake and alert   Airway & Oxygen Therapy: Patient Spontanous Breathing and Patient connected to face mask  Post-op Assessment: Report given to RN and Post -op Vital signs reviewed and stable  Post vital signs: Reviewed and stable  Last Vitals:  Vitals Value Taken Time  BP 176/103 06/26/24 11:45  Temp    Pulse 70 06/26/24 11:48  Resp 12 06/26/24 11:43  SpO2 95 % 06/26/24 11:48  Vitals shown include unfiled device data.  Last Pain:  Vitals:   06/26/24 0803  TempSrc:   PainSc: 0-No pain         Complications: No notable events documented.

## 2024-06-26 NOTE — Progress Notes (Signed)
 Mobility Specialist - Progress Note   06/26/24 1500  Mobility  Activity Ambulated with assistance  Level of Assistance Standby assist, set-up cues, supervision of patient - no hands on  Assistive Device Front wheel walker  Distance Ambulated (ft) 100 ft  Range of Motion/Exercises Active  Activity Response Tolerated well  Mobility Referral Yes  Mobility visit 1 Mobility  Mobility Specialist Start Time (ACUTE ONLY) 1445  Mobility Specialist Stop Time (ACUTE ONLY) 1500  Mobility Specialist Time Calculation (min) (ACUTE ONLY) 15 min   Pt was found in bed and agreeable to mobilize. No complaints. At EOS returned to bed with all needs met. Call bell in reach and RN notified.   Erminio Leos,  Mobility Specialist Can be reached via Secure Chat

## 2024-06-26 NOTE — Plan of Care (Signed)

## 2024-06-26 NOTE — Anesthesia Procedure Notes (Signed)
 Procedure Name: Intubation Date/Time: 06/26/2024 10:15 AM  Performed by: Franchot Delon RAMAN, CRNAPre-anesthesia Checklist: Patient identified, Emergency Drugs available, Suction available and Patient being monitored Patient Re-evaluated:Patient Re-evaluated prior to induction Oxygen Delivery Method: Circle System Utilized Preoxygenation: Pre-oxygenation with 100% oxygen Induction Type: IV induction Ventilation: Mask ventilation without difficulty Laryngoscope Size: Mac and 4 Grade View: Grade I Tube type: Oral Tube size: 7.5 mm Number of attempts: 1 Airway Equipment and Method: Stylet Placement Confirmation: ETT inserted through vocal cords under direct vision, positive ETCO2 and breath sounds checked- equal and bilateral Secured at: 22 cm Tube secured with: Tape Dental Injury: Teeth and Oropharynx as per pre-operative assessment  Difficulty Due To: Difficult Airway- due to large tongue and Difficult Airway- due to anterior larynx Comments: SRNA Norleen Ozell Hoop

## 2024-06-26 NOTE — Addendum Note (Signed)
 Addendum  created 06/26/24 1443 by Franchot Delon RAMAN, CRNA   Flowsheet accepted

## 2024-06-27 ENCOUNTER — Encounter (HOSPITAL_COMMUNITY): Payer: Self-pay | Admitting: General Surgery

## 2024-06-27 DIAGNOSIS — K8010 Calculus of gallbladder with chronic cholecystitis without obstruction: Secondary | ICD-10-CM | POA: Diagnosis not present

## 2024-06-27 LAB — SURGICAL PATHOLOGY

## 2024-06-27 MED ORDER — IBUPROFEN 800 MG PO TABS: 800.0000 mg | ORAL_TABLET | Freq: Three times a day (TID) | ORAL | 0 refills | Status: AC | PRN

## 2024-06-27 MED ORDER — TRAMADOL HCL 50 MG PO TABS: 50.0000 mg | ORAL_TABLET | Freq: Four times a day (QID) | ORAL | 0 refills | Status: AC | PRN

## 2024-06-27 NOTE — Discharge Summary (Signed)
 Physician Discharge Summary  [name] [name] FMW:[id number] DOB: 02/04/1951 DOA: 06/26/2024  PCP: Jerome Heron Ruth, PA-C  Admit date: 06/26/2024 Discharge date:  06/27/2024   Recommendations for Outpatient Follow-up:   (include homehealth, outpatient follow-up instructions, specific recommendations for PCP to follow-up on, etc.)   Discharge Diagnoses:  Principal Problem:   Chronic calculous cholecystitis   Surgical Procedure: lap chole  Discharge Condition: Good Disposition: Home  Diet recommendation: carb modified diet   Hospital Course:  73 yo male presented for gallbladder removal. Post op he did well and was discharged home POD 1.  Discharge Instructions  Discharge Instructions     Diet - low sodium heart healthy   Complete by: As directed    Discharge wound care:   Complete by: As directed    Shower normal tomorrow. Glue to stay on for 10-14 days. No bandage needed.   Increase activity slowly   Complete by: As directed       Allergies as of 06/27/2024       Reactions   Cephalexin Itching        Medication List     TAKE these medications    albuterol  108 (90 Base) MCG/ACT inhaler Commonly known as: VENTOLIN  HFA Inhale 2 puffs into the lungs every 6 (six) hours as needed for wheezing or shortness of breath.   amLODipine  5 MG tablet Commonly known as: NORVASC  Take 1 tablet by mouth once daily   aspirin  81 MG tablet Take 81 mg by mouth daily.   fluticasone  50 MCG/ACT nasal spray Commonly known as: FLONASE  Use 2 spray(s) in each nostril once daily What changed:  how much to take how to take this when to take this reasons to take this   fluticasone -salmeterol 250-50 MCG/ACT Aepb Commonly known as: ADVAIR Inhale 1 puff into the lungs in the morning and at bedtime. What changed:  when to take this reasons to take this   FreeStyle Libre 3 Reader [facility] 1 Device by Does not apply route daily.   FreeStyle Libre 3 Sensor Misc Place 1  sensor on the skin every 14 days. Use to check glucose continuously E11.9   ibuprofen 800 MG tablet Commonly known as: ADVIL Take 1 tablet (800 mg total) by mouth every 8 (eight) hours as needed.   losartan  50 MG tablet Commonly known as: COZAAR  Take 1 tablet (50 mg total) by mouth daily.   metFORMIN  500 MG 24 hr tablet Commonly known as: GLUCOPHAGE -XR Take 2 tabs by mouth in the AM, and 1 tab in the afternoon for cbg > 150 What changed:  how much to take how to take this when to take this additional instructions   omeprazole  20 MG capsule Commonly known as: PRILOSEC Take 2 capsules (40 mg total) by mouth daily. What changed: how much to take   sennosides-docusate sodium  8.6-50 MG tablet Commonly known as: SENOKOT-S Take 1 tablet by mouth daily. What changed:  when to take this reasons to take this   traMADol  50 MG tablet Commonly known as: ULTRAM  Take 1 tablet (50 mg total) by mouth every 6 (six) hours as needed for moderate pain (pain score 4-6) or severe pain (pain score 7-10).   VITAMIN B COMPLEX-C PO Take by mouth.   VITAMIN D  PO Take 1 tablet by mouth daily.               Discharge Care Instructions  (From admission, onward)           Start  Ordered   06/27/24 0000  Discharge wound care:       Comments: Shower normal tomorrow. Glue to stay on for 10-14 days. No bandage needed.   06/27/24 0753              The results of significant diagnostics from this hospitalization (including imaging, microbiology, ancillary and laboratory) are listed below for reference.    Significant Diagnostic Studies: CT ABDOMEN PELVIS W CONTRAST Result Date: 05/29/2024 CLINICAL DATA:  Abdominal pain for several weeks. EXAM: CT ABDOMEN AND PELVIS WITH CONTRAST TECHNIQUE: Multidetector CT imaging of the abdomen and pelvis was performed using the standard protocol following bolus administration of intravenous contrast. RADIATION DOSE REDUCTION: This exam was  performed according to the departmental dose-optimization program which includes automated exposure control, adjustment of the mA and/or kV according to patient size and/or use of iterative reconstruction technique. CONTRAST:  OMNIPAQUE  IOHEXOL  300 MG/ML  SOLN COMPARISON:  01/26/2022. Complete abdomen ultrasound dated 03/11/2023. FINDINGS: Lower chest: Normal sized heart.  Clear lung bases. Hepatobiliary: Diffuse low-density of the liver relative to the spleen. Small inferior right lobe liver cyst. Minimal small cyst or hemangioma in the anterior lateral segment left lobe liver. Multiple gallstones in the gallbladder without gallbladder wall thickening or pericholecystic fluid. The largest individual stone measures 2.5 cm. Pancreas: Mild pancreatic atrophy. Spleen: Normal in size without focal abnormality. Adrenals/Urinary Tract: Stable small simple appearing bilateral renal cysts not needing imaging follow-up. Unremarkable adrenal glands, ureters and urinary bladder. Stomach/Bowel: Multiple diverticula throughout the colon without evidence of diverticulitis. Normal-appearing appendix, stomach and small bowel. Vascular/Lymphatic: No significant vascular findings are present. No enlarged abdominal or pelvic lymph nodes. Reproductive: Moderately enlarged prostate gland. Other: No abdominal wall hernia or abnormality. No abdominopelvic ascites. Musculoskeletal: Stable lumbosacral hardware and solid bone fusion. 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 Stable T12 compression deformity. Minimal bilateral hip degenerative changes. IMPRESSION: 1. No acute abnormality. 2. Cholelithiasis without evidence of cholecystitis. 3. Diffuse hepatic steatosis. 4. Colonic diverticulosis without evidence of diverticulitis. 5. Moderately enlarged prostate gland. Electronically Signed   By: Elspeth Bathe M.D.   On: 05/29/2024 17:17    Labs: Basic Metabolic Panel: Recent Labs  Lab 06/21/24 0955  06/26/24 1335  NA 139  --   K 3.9  --   CL 99  --   CO2 30  --   GLUCOSE 170*  --   BUN <5*  --   CREATININE 0.76 0.72  CALCIUM 9.4  --    Liver Function Tests: No results for input(s): AST, ALT, ALKPHOS, BILITOT, PROT, ALBUMIN in the last 168 hours.  CBC: Recent Labs  Lab 06/21/24 0955 06/26/24 1335  WBC 8.8 17.9*  HGB 16.0 16.4  HCT 46.4 47.9  MCV 90.6 92.1  PLT 358 307    CBG: Recent Labs  Lab 06/21/24 0914 06/26/24 0716 06/26/24 1151 06/26/24 1628 06/26/24 2051  GLUCAP 208* 161* 220* 255* 239*    Principal Problem:   Chronic calculous cholecystitis   Time coordinating discharge: 15 min

## 2024-06-27 NOTE — TOC Transition Note (Signed)
 Transition of Care [facility]) - Discharge Note   Patient Details  Name: [name] [name] MRN: [id number] Date of Birth: 01/08/1951  [facility] CM/SW Contact:  Alfonse JONELLE Rex, RN Phone Number: 06/27/2024, 11:23 AM   Clinical Narrative:   Admitted for scheduled outpatient procedure. No TOC consults/needs.           Patient Goals and CMS Choice            Discharge Placement                       Discharge Plan and Services Additional resources added to the After Visit Summary for                                       Social Drivers of Health (SDOH) Interventions SDOH Screenings   Food Insecurity: No Food Insecurity (06/26/2024)  Housing: Low Risk  (06/26/2024)  Transportation Needs: No Transportation Needs (06/26/2024)  Utilities: Not At Risk (06/26/2024)  Depression (PHQ2-9): Low Risk  (10/15/2023)  Social Connections: Socially Isolated (06/26/2024)  Tobacco Use: Low Risk  (06/26/2024)     Readmission Risk Interventions     No data to display

## 2024-06-27 NOTE — Progress Notes (Signed)
 Discharge instructions provided to pt. All questions answered. Pt arranging d/c transport home through [employer].

## 2024-06-27 NOTE — Discharge Instructions (Signed)
 [facility] ______[facility] LAPAROSCOPIC SURGERY: POST OP INSTRUCTIONS Always review your discharge instruction sheet given to you by the facility where your surgery was performed. IF YOU HAVE DISABILITY OR FAMILY LEAVE FORMS, YOU MUST BRING THEM TO THE OFFICE FOR PROCESSING.   DO NOT GIVE THEM TO YOUR DOCTOR.  A prescription for pain medication may be given to you upon discharge.  Take your pain medication as prescribed, if needed.  If narcotic pain medicine is not needed, then you may take acetaminophen  (Tylenol ) or ibuprofen  (Advil ) as needed. Take your usually prescribed medications unless otherwise directed. If you need a refill on your pain medication, please contact your pharmacy.  They will contact our office to request authorization. Prescriptions will not be filled after 5pm or on week-ends. You should follow a light diet the first few days after arrival home, such as soup and crackers, etc.  Be sure to include lots of fluids daily. Most patients will experience some swelling and bruising in the area of the incisions.  Ice packs will help.  Swelling and bruising can take several days to resolve.  It is common to experience some constipation if taking pain medication after surgery.  Increasing fluid intake and taking a stool softener (such as Colace) will usually help or prevent this problem from occurring.  A mild laxative (Milk of Magnesia or Miralax) should be taken according to package instructions if there are no bowel movements after 48 hours. Unless discharge instructions indicate otherwise, you may remove your bandages 24-48 hours after surgery, and you may shower at that time.  You may have steri-strips (small skin tapes) in place directly over the incision.  These strips should be left on the skin for 7-10 days.  If your surgeon used skin glue on the incision, you may shower in 24 hours.  The glue will flake off over the next 2-3 weeks.  Any sutures or staples will be  removed at the office during your follow-up visit. ACTIVITIES:  You may resume regular (light) daily activities beginning the next day--such as daily self-care, walking, climbing stairs--gradually increasing activities as tolerated.  You may have sexual intercourse when it is comfortable.  Refrain from any heavy lifting or straining until approved by your doctor. You may drive when you are no longer taking prescription pain medication, you can comfortably wear a seatbelt, and you can safely maneuver your car and apply brakes. RETURN TO WORK:  __________________________________________________________ Rosine should see your doctor in the office for a follow-up appointment approximately 2-3 weeks after your surgery.  Make sure that you call for this appointment within a day or two after you arrive home to insure a convenient appointment time. OTHER INSTRUCTIONS: __________________________________________________________________________________________________________________________ __________________________________________________________________________________________________________________________ WHEN TO CALL YOUR DOCTOR: Fever over 101.0 Inability to urinate Continued bleeding from incision. Increased pain, redness, or drainage from the incision. Increasing abdominal pain  The clinic staff is available to answer your questions during regular business hours.  Please don't hesitate to call and ask to speak to one of the nurses for clinical concerns.  If you have a medical emergency, go to the nearest emergency room or call 911.  A surgeon from [facility] is always on call at the hospital. [address], [address], [place], [place]  [address] ? [address] [address], [place], [place]   [address] [phone] ? [phone] ? FAX [phone] Web site: [email or web address]

## 2024-07-11 DIAGNOSIS — L280 Lichen simplex chronicus: Secondary | ICD-10-CM | POA: Diagnosis not present

## 2024-07-11 DIAGNOSIS — L820 Inflamed seborrheic keratosis: Secondary | ICD-10-CM | POA: Diagnosis not present

## 2024-07-11 DIAGNOSIS — L720 Epidermal cyst: Secondary | ICD-10-CM | POA: Diagnosis not present

## 2024-07-11 DIAGNOSIS — Z85828 Personal history of other malignant neoplasm of skin: Secondary | ICD-10-CM | POA: Diagnosis not present

## 2024-07-23 DIAGNOSIS — E781 Pure hyperglyceridemia: Secondary | ICD-10-CM | POA: Diagnosis not present

## 2024-07-23 DIAGNOSIS — Z6829 Body mass index (BMI) 29.0-29.9, adult: Secondary | ICD-10-CM | POA: Diagnosis not present

## 2024-07-23 DIAGNOSIS — R7989 Other specified abnormal findings of blood chemistry: Secondary | ICD-10-CM | POA: Diagnosis not present

## 2024-07-23 DIAGNOSIS — K760 Fatty (change of) liver, not elsewhere classified: Secondary | ICD-10-CM | POA: Diagnosis not present

## 2024-07-23 DIAGNOSIS — M4810 Ankylosing hyperostosis [Forestier], site unspecified: Secondary | ICD-10-CM | POA: Diagnosis not present

## 2024-07-23 DIAGNOSIS — Z7952 Long term (current) use of systemic steroids: Secondary | ICD-10-CM | POA: Diagnosis not present

## 2024-07-23 DIAGNOSIS — B009 Herpesviral infection, unspecified: Secondary | ICD-10-CM | POA: Diagnosis not present

## 2024-07-23 DIAGNOSIS — E1142 Type 2 diabetes mellitus with diabetic polyneuropathy: Secondary | ICD-10-CM | POA: Diagnosis not present

## 2024-07-23 DIAGNOSIS — K219 Gastro-esophageal reflux disease without esophagitis: Secondary | ICD-10-CM | POA: Diagnosis not present

## 2024-07-25 ENCOUNTER — Ambulatory Visit (AMBULATORY_SURGERY_CENTER)

## 2024-07-25 VITALS — Ht 71.0 in | Wt 209.8 lb

## 2024-07-25 DIAGNOSIS — Z860100 Personal history of colon polyps, unspecified: Secondary | ICD-10-CM

## 2024-07-25 DIAGNOSIS — Z800 Family history of malignant neoplasm of digestive organs: Secondary | ICD-10-CM

## 2024-07-25 DIAGNOSIS — Z83719 Family history of colon polyps, unspecified: Secondary | ICD-10-CM

## 2024-07-25 MED ORDER — NA SULFATE-K SULFATE-MG SULF 17.5-3.13-1.6 GM/177ML PO SOLN: 1.0000 | Freq: Once | ORAL | 0 refills | Status: AC

## 2024-07-25 NOTE — Progress Notes (Unsigned)

## 2024-08-01 ENCOUNTER — Encounter: Payer: Self-pay | Admitting: Internal Medicine

## 2024-08-08 ENCOUNTER — Other Ambulatory Visit: Payer: Self-pay | Admitting: Internal Medicine

## 2024-08-08 ENCOUNTER — Encounter: Payer: Self-pay | Admitting: Internal Medicine

## 2024-08-08 ENCOUNTER — Ambulatory Visit: Admitting: Internal Medicine

## 2024-08-08 ENCOUNTER — Other Ambulatory Visit: Payer: Self-pay

## 2024-08-08 VITALS — BP 141/100 | HR 99 | Temp 98.8°F | Ht 71.0 in | Wt 209.8 lb

## 2024-08-08 DIAGNOSIS — Z860100 Personal history of colon polyps, unspecified: Secondary | ICD-10-CM

## 2024-08-08 DIAGNOSIS — Z83719 Family history of colon polyps, unspecified: Secondary | ICD-10-CM

## 2024-08-08 MED ORDER — SODIUM CHLORIDE 0.9 % IV SOLN: 500.0000 mL | INTRAVENOUS | Status: AC

## 2024-08-08 NOTE — Patient Instructions (Signed)
 Please see following appointments:

## 2024-08-08 NOTE — Progress Notes (Signed)
 Patient had solids all day yesterday as well as unsure how much water  he drank before coming to procedure this morning.  He stated his last bowel movement looked like pure chocolate.    Per Dr. Albertus, we have rescheduled him for a later date with a previsit.  He was offered an appointment for tomorrow but he didn't feel that he could remain on clears today and go through another prep.   Patient will need 2 day prep wit Miralax and suprep.

## 2024-09-22 ENCOUNTER — Telehealth: Payer: Self-pay

## 2024-09-22 NOTE — Telephone Encounter (Signed)
 RN attempted to contact patient about rescheduling her PV appt on Monday, 09/25/24 because of [facility] offices being closed d/t predicted bad weather. RN left vm informing patient of this and requested that patient call our offices to reschedule her PV.   RN informed patient to avoid taking any NSAID medication for 1 full week prior to her procedure scheduled for Monday, 10/02/24.

## 2024-09-25 ENCOUNTER — Encounter

## 2024-09-26 ENCOUNTER — Telehealth: Payer: Self-pay

## 2024-09-26 NOTE — Telephone Encounter (Signed)
 RN made another attempt to contact patient about rescheduling her pre-visit which had to be cancelled due to weather. (Patient contact attempt was made on 09/22/24 regarding this cancellation.) Patient is scheduled for colonoscopy on 10/02/24. RN left another message with patient, asking her to call our offices as soon as possible to review the prep instructions prior to her procedure.

## 2024-09-27 ENCOUNTER — Encounter: Payer: Self-pay | Admitting: Internal Medicine

## 2024-10-02 ENCOUNTER — Encounter: Admitting: Internal Medicine

## 2024-10-13 ENCOUNTER — Encounter

## 2024-10-20 ENCOUNTER — Encounter: Admitting: Internal Medicine

## 2024-11-01 ENCOUNTER — Ambulatory Visit: Admitting: Podiatry
